# Patient Record
Sex: Male | Born: 1980 | Race: White | Hispanic: No | Marital: Married | Smoking: Current every day smoker
Health system: Southern US, Community
[De-identification: ages and names within clinical notes are randomized; demographics above are authoritative.]

## PROBLEM LIST (undated history)

## (undated) DIAGNOSIS — F1910 Other psychoactive substance abuse, uncomplicated: Secondary | ICD-10-CM

## (undated) DIAGNOSIS — F32A Depression, unspecified: Secondary | ICD-10-CM

## (undated) DIAGNOSIS — F1410 Cocaine abuse, uncomplicated: Secondary | ICD-10-CM

## (undated) DIAGNOSIS — F329 Major depressive disorder, single episode, unspecified: Secondary | ICD-10-CM

## (undated) DIAGNOSIS — F419 Anxiety disorder, unspecified: Secondary | ICD-10-CM

## (undated) DIAGNOSIS — F209 Schizophrenia, unspecified: Secondary | ICD-10-CM

## (undated) DIAGNOSIS — J40 Bronchitis, not specified as acute or chronic: Secondary | ICD-10-CM

## (undated) DIAGNOSIS — F319 Bipolar disorder, unspecified: Secondary | ICD-10-CM

## (undated) DIAGNOSIS — F1110 Opioid abuse, uncomplicated: Secondary | ICD-10-CM

## (undated) HISTORY — PX: TYMPANOPLASTY: SHX33

## (undated) HISTORY — PX: NOSE SURGERY: SHX723

---

## 1999-03-10 ENCOUNTER — Emergency Department (HOSPITAL_COMMUNITY): Admission: EM | Admit: 1999-03-10 | Discharge: 1999-03-10 | Payer: Self-pay

## 2007-12-22 ENCOUNTER — Emergency Department: Payer: Self-pay | Admitting: Emergency Medicine

## 2008-02-06 ENCOUNTER — Emergency Department: Payer: Self-pay | Admitting: Internal Medicine

## 2008-02-26 ENCOUNTER — Emergency Department: Payer: Self-pay | Admitting: Emergency Medicine

## 2008-05-29 ENCOUNTER — Emergency Department: Payer: Self-pay | Admitting: Internal Medicine

## 2008-07-18 ENCOUNTER — Ambulatory Visit: Payer: Self-pay | Admitting: Pain Medicine

## 2008-10-29 ENCOUNTER — Emergency Department: Payer: Self-pay | Admitting: Emergency Medicine

## 2009-10-17 ENCOUNTER — Emergency Department: Payer: Self-pay | Admitting: Emergency Medicine

## 2009-11-17 ENCOUNTER — Emergency Department: Payer: Self-pay | Admitting: Emergency Medicine

## 2010-02-21 ENCOUNTER — Inpatient Hospital Stay: Payer: Self-pay | Admitting: Psychiatry

## 2010-03-25 ENCOUNTER — Emergency Department: Payer: Self-pay | Admitting: Unknown Physician Specialty

## 2010-03-27 ENCOUNTER — Emergency Department: Payer: Self-pay | Admitting: Emergency Medicine

## 2010-04-03 ENCOUNTER — Ambulatory Visit: Payer: Self-pay | Admitting: Unknown Physician Specialty

## 2010-04-24 ENCOUNTER — Emergency Department: Payer: Self-pay | Admitting: Emergency Medicine

## 2011-06-29 ENCOUNTER — Emergency Department: Payer: Self-pay | Admitting: Emergency Medicine

## 2011-12-04 ENCOUNTER — Emergency Department: Payer: Self-pay | Admitting: Emergency Medicine

## 2011-12-26 ENCOUNTER — Emergency Department: Payer: Self-pay | Admitting: Emergency Medicine

## 2012-03-15 LAB — URINALYSIS, COMPLETE
Bacteria: NONE SEEN
Bilirubin,UR: NEGATIVE
Blood: NEGATIVE
Glucose,UR: NEGATIVE mg/dL (ref 0–75)
Hyaline Cast: 1
Leukocyte Esterase: NEGATIVE
Nitrite: NEGATIVE
Ph: 5 (ref 4.5–8.0)
Protein: NEGATIVE
RBC,UR: 2 /HPF (ref 0–5)
Specific Gravity: 1.028 (ref 1.003–1.030)
Squamous Epithelial: NONE SEEN
WBC UR: 4 /HPF (ref 0–5)

## 2012-03-15 LAB — COMPREHENSIVE METABOLIC PANEL
Albumin: 4.3 g/dL (ref 3.4–5.0)
Alkaline Phosphatase: 70 U/L (ref 50–136)
Anion Gap: 9 (ref 7–16)
BUN: 14 mg/dL (ref 7–18)
Bilirubin,Total: 0.3 mg/dL (ref 0.2–1.0)
Calcium, Total: 8.7 mg/dL (ref 8.5–10.1)
Chloride: 102 mmol/L (ref 98–107)
Co2: 27 mmol/L (ref 21–32)
Creatinine: 1.35 mg/dL — ABNORMAL HIGH (ref 0.60–1.30)
EGFR (African American): >60
EGFR (Non-African Amer.): >60
Glucose: 110 mg/dL — ABNORMAL HIGH (ref 65–99)
Osmolality: 277 (ref 275–301)
Potassium: 3.7 mmol/L (ref 3.5–5.1)
SGOT(AST): 21 U/L (ref 15–37)
SGPT (ALT): 40 U/L
Sodium: 138 mmol/L (ref 136–145)
Total Protein: 7.8 g/dL (ref 6.4–8.2)

## 2012-03-15 LAB — DRUG SCREEN, URINE
Amphetamines, Ur Screen: NEGATIVE (ref ?–1000)
Barbiturates, Ur Screen: NEGATIVE (ref ?–200)
Benzodiazepine, Ur Scrn: POSITIVE (ref ?–200)
Cannabinoid 50 Ng, Ur ~~LOC~~: POSITIVE (ref ?–50)
Cocaine Metabolite,Ur ~~LOC~~: POSITIVE (ref ?–300)
MDMA (Ecstasy)Ur Screen: NEGATIVE (ref ?–500)
Methadone, Ur Screen: POSITIVE (ref ?–300)
Opiate, Ur Screen: POSITIVE (ref ?–300)
Phencyclidine (PCP) Ur S: NEGATIVE (ref ?–25)
Tricyclic, Ur Screen: NEGATIVE (ref ?–1000)

## 2012-03-15 LAB — CBC
HCT: 45.5 % (ref 40.0–52.0)
HGB: 15.4 g/dL (ref 13.0–18.0)
MCH: 30.3 pg (ref 26.0–34.0)
MCHC: 33.9 g/dL (ref 32.0–36.0)
MCV: 89 fL (ref 80–100)
Platelet: 169 10*3/uL (ref 150–440)
RBC: 5.09 10*6/uL (ref 4.40–5.90)
RDW: 12.6 % (ref 11.5–14.5)
WBC: 11.2 10*3/uL — ABNORMAL HIGH (ref 3.8–10.6)

## 2012-03-15 LAB — ETHANOL
Ethanol %: <0.003 % (ref 0.000–0.080)
Ethanol: <3 mg/dL

## 2012-03-15 LAB — TSH: Thyroid Stimulating Horm: 6.76 u[IU]/mL — ABNORMAL HIGH

## 2012-03-16 ENCOUNTER — Inpatient Hospital Stay: Payer: Self-pay | Admitting: Psychiatry

## 2012-03-16 LAB — URINALYSIS, COMPLETE
Bacteria: NONE SEEN
Bilirubin,UR: NEGATIVE
Glucose,UR: NEGATIVE mg/dL (ref 0–75)
Ketone: NEGATIVE
Leukocyte Esterase: NEGATIVE
Nitrite: NEGATIVE
Ph: 5 (ref 4.5–8.0)
Protein: NEGATIVE
RBC,UR: 3 /HPF (ref 0–5)
Specific Gravity: 1.018 (ref 1.003–1.030)
Squamous Epithelial: <1
WBC UR: 4 /HPF (ref 0–5)

## 2012-03-17 LAB — BEHAVIORAL MEDICINE 1 PANEL
Albumin: 3.4 g/dL (ref 3.4–5.0)
Alkaline Phosphatase: 61 U/L (ref 50–136)
Anion Gap: 4 — ABNORMAL LOW (ref 7–16)
BUN: 11 mg/dL (ref 7–18)
Basophil #: 0 10*3/uL (ref 0.0–0.1)
Basophil %: 0.5 %
Bilirubin,Total: 0.2 mg/dL (ref 0.2–1.0)
Calcium, Total: 8.1 mg/dL — ABNORMAL LOW (ref 8.5–10.1)
Chloride: 106 mmol/L (ref 98–107)
Co2: 29 mmol/L (ref 21–32)
Creatinine: 0.98 mg/dL (ref 0.60–1.30)
EGFR (African American): >60
EGFR (Non-African Amer.): >60
Eosinophil #: 0.1 10*3/uL (ref 0.0–0.7)
Eosinophil %: 1.6 %
Glucose: 107 mg/dL — ABNORMAL HIGH (ref 65–99)
HCT: 44.3 % (ref 40.0–52.0)
HGB: 15 g/dL (ref 13.0–18.0)
Lymphocyte #: 2.4 10*3/uL (ref 1.0–3.6)
Lymphocyte %: 34.6 %
MCH: 30.3 pg (ref 26.0–34.0)
MCHC: 34 g/dL (ref 32.0–36.0)
MCV: 89 fL (ref 80–100)
Monocyte #: 0.5 x10 3/mm (ref 0.2–1.0)
Monocyte %: 7.8 %
Neutrophil #: 3.9 10*3/uL (ref 1.4–6.5)
Neutrophil %: 55.5 %
Osmolality: 277 (ref 275–301)
Platelet: 153 10*3/uL (ref 150–440)
Potassium: 4.2 mmol/L (ref 3.5–5.1)
RBC: 4.96 10*6/uL (ref 4.40–5.90)
RDW: 12.4 % (ref 11.5–14.5)
SGOT(AST): 17 U/L (ref 15–37)
SGPT (ALT): 35 U/L
Sodium: 139 mmol/L (ref 136–145)
Thyroid Stimulating Horm: 1.27 u[IU]/mL
Total Protein: 6.5 g/dL (ref 6.4–8.2)
WBC: 7.1 10*3/uL (ref 3.8–10.6)

## 2012-07-11 LAB — COMPREHENSIVE METABOLIC PANEL
Albumin: 4.3 g/dL (ref 3.4–5.0)
Alkaline Phosphatase: 64 U/L (ref 50–136)
Anion Gap: 11 (ref 7–16)
BUN: 9 mg/dL (ref 7–18)
Bilirubin,Total: 0.5 mg/dL (ref 0.2–1.0)
Calcium, Total: 9 mg/dL (ref 8.5–10.1)
Chloride: 105 mmol/L (ref 98–107)
Co2: 25 mmol/L (ref 21–32)
Creatinine: 0.85 mg/dL (ref 0.60–1.30)
EGFR (African American): >60
EGFR (Non-African Amer.): >60
Glucose: 99 mg/dL (ref 65–99)
Osmolality: 280 (ref 275–301)
Potassium: 3.7 mmol/L (ref 3.5–5.1)
SGOT(AST): 23 U/L (ref 15–37)
SGPT (ALT): 41 U/L (ref 12–78)
Sodium: 141 mmol/L (ref 136–145)
Total Protein: 7.8 g/dL (ref 6.4–8.2)

## 2012-07-11 LAB — TSH: Thyroid Stimulating Horm: 0.688 u[IU]/mL

## 2012-07-11 LAB — CBC
HCT: 43.1 %
HGB: 15.6 g/dL
MCH: 30.9 pg
MCHC: 36.3 g/dL — ABNORMAL HIGH
MCV: 85 fL
Platelet: 180 x10 3/mm 3
RBC: 5.06 x10 6/mm 3
RDW: 12.6 %
WBC: 8.9 x10 3/mm 3

## 2012-07-11 LAB — ETHANOL
Ethanol %: <0.003 % (ref 0.000–0.080)
Ethanol: <3 mg/dL

## 2012-07-12 ENCOUNTER — Inpatient Hospital Stay: Payer: Self-pay | Admitting: Psychiatry

## 2012-07-12 LAB — DRUG SCREEN, URINE
Amphetamines, Ur Screen: NEGATIVE (ref ?–1000)
Barbiturates, Ur Screen: NEGATIVE (ref ?–200)
Benzodiazepine, Ur Scrn: NEGATIVE (ref ?–200)
Cannabinoid 50 Ng, Ur ~~LOC~~: POSITIVE (ref ?–50)
Cocaine Metabolite,Ur ~~LOC~~: POSITIVE (ref ?–300)
MDMA (Ecstasy)Ur Screen: NEGATIVE (ref ?–500)
Methadone, Ur Screen: POSITIVE (ref ?–300)
Opiate, Ur Screen: POSITIVE (ref ?–300)
Phencyclidine (PCP) Ur S: NEGATIVE (ref ?–25)
Tricyclic, Ur Screen: NEGATIVE (ref ?–1000)

## 2012-07-12 LAB — URINALYSIS, COMPLETE
Bacteria: NONE SEEN
Bilirubin,UR: NEGATIVE
Glucose,UR: NEGATIVE mg/dL (ref 0–75)
Ketone: NEGATIVE
Leukocyte Esterase: NEGATIVE
Nitrite: NEGATIVE
Ph: 6 (ref 4.5–8.0)
Protein: NEGATIVE
RBC,UR: 5 /HPF (ref 0–5)
Specific Gravity: 1.015 (ref 1.003–1.030)
Squamous Epithelial: NONE SEEN
WBC UR: NONE SEEN /HPF (ref 0–5)

## 2012-09-16 ENCOUNTER — Emergency Department: Payer: Self-pay | Admitting: Emergency Medicine

## 2012-09-16 LAB — CBC WITH DIFFERENTIAL/PLATELET
Basophil #: 0 10*3/uL (ref 0.0–0.1)
Basophil %: 0.6 %
Eosinophil #: 0.1 10*3/uL (ref 0.0–0.7)
Eosinophil %: 1.1 %
HCT: 44 % (ref 40.0–52.0)
HGB: 15.5 g/dL (ref 13.0–18.0)
Lymphocyte #: 2.2 10*3/uL (ref 1.0–3.6)
Lymphocyte %: 37.5 %
MCH: 30.3 pg (ref 26.0–34.0)
MCHC: 35.2 g/dL (ref 32.0–36.0)
MCV: 86 fL (ref 80–100)
Monocyte #: 0.8 x10 3/mm (ref 0.2–1.0)
Monocyte %: 12.6 %
Neutrophil #: 2.9 10*3/uL (ref 1.4–6.5)
Neutrophil %: 48.2 %
Platelet: 149 10*3/uL — ABNORMAL LOW (ref 150–440)
RBC: 5.11 10*6/uL (ref 4.40–5.90)
RDW: 12.8 % (ref 11.5–14.5)
WBC: 6 10*3/uL (ref 3.8–10.6)

## 2012-09-16 LAB — COMPREHENSIVE METABOLIC PANEL
Albumin: 3.9 g/dL (ref 3.4–5.0)
Alkaline Phosphatase: 70 U/L (ref 50–136)
Anion Gap: 7 (ref 7–16)
BUN: 14 mg/dL (ref 7–18)
Bilirubin,Total: 0.3 mg/dL (ref 0.2–1.0)
Calcium, Total: 8.4 mg/dL — ABNORMAL LOW (ref 8.5–10.1)
Chloride: 106 mmol/L (ref 98–107)
Co2: 27 mmol/L (ref 21–32)
Creatinine: 1.07 mg/dL (ref 0.60–1.30)
EGFR (African American): >60
EGFR (Non-African Amer.): >60
Glucose: 107 mg/dL — ABNORMAL HIGH (ref 65–99)
Osmolality: 280 (ref 275–301)
Potassium: 3.8 mmol/L (ref 3.5–5.1)
SGOT(AST): 24 U/L (ref 15–37)
SGPT (ALT): 50 U/L (ref 12–78)
Sodium: 140 mmol/L (ref 136–145)
Total Protein: 7.5 g/dL (ref 6.4–8.2)

## 2012-09-16 LAB — URINALYSIS, COMPLETE
Bilirubin,UR: NEGATIVE
Glucose,UR: NEGATIVE mg/dL (ref 0–75)
Hyaline Cast: 35
Ketone: NEGATIVE
Leukocyte Esterase: NEGATIVE
Nitrite: NEGATIVE
Ph: 5 (ref 4.5–8.0)
Protein: 30
RBC,UR: 27 /HPF (ref 0–5)
Specific Gravity: 1.025 (ref 1.003–1.030)
Squamous Epithelial: <1
WBC UR: 8 /HPF (ref 0–5)

## 2012-09-18 LAB — URINE CULTURE

## 2014-05-27 ENCOUNTER — Emergency Department: Payer: Self-pay | Admitting: Emergency Medicine

## 2014-05-27 LAB — CBC WITH DIFFERENTIAL/PLATELET
Basophil #: 0.1 10*3/uL (ref 0.0–0.1)
Basophil %: 0.6 %
Eosinophil #: 0.2 10*3/uL (ref 0.0–0.7)
Eosinophil %: 1.6 %
HCT: 42.8 % (ref 40.0–52.0)
HGB: 14.3 g/dL (ref 13.0–18.0)
Lymphocyte #: 2.6 10*3/uL (ref 1.0–3.6)
Lymphocyte %: 23.2 %
MCH: 29.9 pg (ref 26.0–34.0)
MCHC: 33.3 g/dL (ref 32.0–36.0)
MCV: 90 fL (ref 80–100)
Monocyte #: 0.6 x10 3/mm (ref 0.2–1.0)
Monocyte %: 5.3 %
Neutrophil #: 7.8 10*3/uL — ABNORMAL HIGH (ref 1.4–6.5)
Neutrophil %: 69.3 %
Platelet: 154 10*3/uL (ref 150–440)
RBC: 4.77 10*6/uL (ref 4.40–5.90)
RDW: 13.3 % (ref 11.5–14.5)
WBC: 11.2 10*3/uL — ABNORMAL HIGH (ref 3.8–10.6)

## 2014-05-27 LAB — BASIC METABOLIC PANEL
Anion Gap: 2 — ABNORMAL LOW (ref 7–16)
BUN: 11 mg/dL (ref 7–18)
Calcium, Total: 8.5 mg/dL (ref 8.5–10.1)
Chloride: 107 mmol/L (ref 98–107)
Co2: 31 mmol/L (ref 21–32)
Creatinine: 0.92 mg/dL (ref 0.60–1.30)
EGFR (African American): >60
EGFR (Non-African Amer.): >60
Glucose: 136 mg/dL — ABNORMAL HIGH (ref 65–99)
Osmolality: 281 (ref 275–301)
Potassium: 3.8 mmol/L (ref 3.5–5.1)
Sodium: 140 mmol/L (ref 136–145)

## 2014-05-27 LAB — URINALYSIS, COMPLETE
Bacteria: NONE SEEN
Bilirubin,UR: NEGATIVE
Glucose,UR: NEGATIVE mg/dL (ref 0–75)
Ketone: NEGATIVE
Leukocyte Esterase: NEGATIVE
Nitrite: NEGATIVE
Ph: 5 (ref 4.5–8.0)
Protein: NEGATIVE
RBC,UR: 5 /HPF (ref 0–5)
Specific Gravity: 1.027 (ref 1.003–1.030)
Squamous Epithelial: <1
WBC UR: 2 /HPF (ref 0–5)

## 2014-05-27 LAB — DRUG SCREEN, URINE
Amphetamines, Ur Screen: NEGATIVE (ref ?–1000)
Barbiturates, Ur Screen: NEGATIVE (ref ?–200)
Benzodiazepine, Ur Scrn: NEGATIVE (ref ?–200)
Cannabinoid 50 Ng, Ur ~~LOC~~: POSITIVE (ref ?–50)
Cocaine Metabolite,Ur ~~LOC~~: POSITIVE (ref ?–300)
MDMA (Ecstasy)Ur Screen: NEGATIVE (ref ?–500)
Methadone, Ur Screen: POSITIVE (ref ?–300)
Opiate, Ur Screen: POSITIVE (ref ?–300)
Phencyclidine (PCP) Ur S: NEGATIVE (ref ?–25)
Tricyclic, Ur Screen: NEGATIVE (ref ?–1000)

## 2014-06-01 LAB — CULTURE, BLOOD (SINGLE)

## 2014-12-31 NOTE — H&P (Signed)
PATIENT NAME:  Randy Woods, Randy Woods   ATTENDING PHYSICIAN: Dr. Braulio ConteJolanta B. Lenny Woods   IDENTIFYING DATA: Mr. Randy Woods is a 34 year old male with history of opioid dependence.   CHIEF COMPLAINT: "I want off pain killers."   HISTORY OF PRESENT ILLNESS: Mr. Randy Woods was hospitalized at [facility] in July of 2013. This was a very brief hospitalization, and the patient left the hospital before completing opioid detox. He had a followup with [facility] but did not see his doctor. He relapsed on narcotic pain killers immediately following discharge. He returns to the Emergency Room asking for opioid detox. He has two children already and a third one on the way and feels guilty and ashamed of his habit and inability to stop and wants to be the best father for his children. Unfortunately, he lost Medicaid and has not been able to follow up with his primary psychiatrist, Dr. Omelia BlackwaterHeaden. The patient was hospitalized at [facility] in 2011. At that time he opted for a methadone clinic but did have Medicaid. He was discharged with a prescription of methadone to last him until his confirmed visit at a local methadone clinic. It is unclear what happened with that.  I have an impression that the patient came to the hospital in hopes to obtain methadone. Unfortunately, we are not license to do opioid detox using methadone. The patient on the other hand does not have insurance to pay for his methadone clinic. He became rather upset when he learned that methadone will not be given and signed a 72-hour form. He endorses some symptoms of depression with poor sleep, increased appetite, and weight gain. He believes it is from medications, social isolation, crying. He denies suicidal ideations but threatens that if he does not get help he will die by overdose. He lives with his  mother and feels that his habit puts strain on his mother. He uses narcotic pain killers every day. He is not discriminating. He uses Percocet, Vicodin, Opana, methadone, roxy, whatever he can put his hands on. He denies symptoms suggestive of bipolar mania or psychotic symptoms. He denies other than narcotic illicit substance use except for smoking marijuana.   PAST PSYCHIATRIC HISTORY: He had several hospitalizations, including [facility].  He was taking Zyprexa, Zonegran, Wellbutrin, Klonopin, Depakote and Atarax for a period of time but does not feel that medications help. His initial hospitalization was precipitated by the death of his wife eight or so years ago. At that time he tried to commit suicide. He had other suicide attempts and gestures by superficially cutting his wrists.   FAMILY PSYCHIATRIC HISTORY: His father, sister, niece and cousin all have mental illness. His father has a history of suicide attempts. He is deceased now.   PAST MEDICAL HISTORY: Asthma.   ALLERGIES: No known drug allergies.   MEDICATIONS ON ADMISSION: None.   SOCIAL HISTORY: He is widowed. He has two kids at home and a third on the way. He is currently unemployed. He lives with his mother. He was in prison for several years for armed robbery but denies current legal charges pending. He obtains prescription pills illegally off the street.   REVIEW OF SYSTEMS: CONSTITUTIONAL: No fevers or chills. Positive for weight gain. EYES: No double or blurred vision. ENT: No hearing loss. RESPIRATORY: No cough or shortness of breath. CARDIOVASCULAR: No chest pain or  orthopnea. GASTROINTESTINAL: Positive for nausea but no diarrhea yet. GU: No incontinence or dysuria. ENDOCRINE: No heat or cold intolerance. LYMPHATIC: No anemia or easy bruising. INTEGUMENTARY: No acne or rash. MUSCULOSKELETAL: Positive for muscle pain. NEUROLOGIC: No tingling or weakness. PSYCHIATRIC: See history of present illness for details.    PHYSICAL EXAMINATION:  VITAL SIGNS: Blood pressure 133/71, pulse 78, respirations 20, temperature 97.2.   GENERAL: This is an obese male in no acute distress.   HEENT: The pupils are equal, round, and reactive to light. Sclerae are anicteric.   NECK: Supple. No thyromegaly.   LUNGS: Clear to auscultation. No dullness to percussion.   HEART: Regular rhythm and rate. No murmurs, rubs, or gallops.   ABDOMEN: Soft, nontender, nondistended. Positive bowel sounds.   MUSCULOSKELETAL: Normal muscle strength in all extremities.   SKIN: No rashes or bruises.   LYMPHATIC: No cervical adenopathy.   NEUROLOGIC: Cranial nerves II through XII are intact. Normal gait.   LABORATORY DATA: Chemistries are within normal limits. Blood alcohol level is zero. LFTs are within normal limits. TSH 0.688. Urine tox screen positive for cocaine, cannabinoids, opiates, and methadone.  CBC within normal limits. Urinalysis is not suggestive of urinary tract infection.   MENTAL STATUS EXAMINATION ON ADMISSION: The patient is alert and oriented to person, place, time, and situation. He complains of physical symptoms of opioid withdrawal. He recognizes me from previous admission and remembers that I was the one to prescribe him methadone. He was explained that it will not be possible anymore as he has no connection with methadone clinic. He is initially pleasant and polite, then gets irritable when he learns that he is not being prescribed methadone. He maintains some eye contact. His speech is of normal rhythm, rate, and volume. Mood is depressed with flat affect. Thought processing is logical and goal oriented. Thought content: He denies suicidal or homicidal ideation. There are no delusions or paranoia. There are no auditory or visual hallucinations. His cognition is grossly intact. He registers three out of three and recalls three out of three objects after five minutes. He knows the current president. His insight and  judgment are questionable.   SUICIDE RISK ASSESSMENT ON ADMISSION: This is a patient with a long history of bipolar disorder, untreated currently, mood instability, substance abuse with inability to stop, who threatens suicide if not treated for substance abuse.  He is at increased risk for suicide.   ASSESSMENT:  AXIS I:   1. Mood disorder, not otherwise specified.  2. Opioid dependence. 3. Marijuana dependence. 4. Cocaine dependence.   AXIS II: Deferred.  AXIS III: Asthma.   AXIS IV: Substance abuse, treatment compliance, mental illness, employment, financial, family conflict.   AXIS V: Global Assessment of Functioning score on admission is 25.   PLAN: The patient was admitted to [facility] for safety, stabilization and medication management. He was initially placed on suicide precautions and was closely monitored for any unsafe behaviors. He underwent full psychiatric and risk assessment. He received pharmacotherapy, individual and group psychotherapy, substance abuse counseling, and support from therapeutic milieu.   1. Suicidal ideation: This has resolved. The patient is able to contract for safety in the hospital.  2. Opioid detox: He was placed on a standard detox protocol with symptomatic treatment.  3. Substance abuse treatment: In the Emergency Room, the patient was asked repeatedly is he wishes to be referred to a substance abuse treatment facility. He refuses treatment and wants to go  to methadone clinic, however, there are no means to do so.  4. Disposition: The patient will be discharged to his mother's house. He will need a follow-up appointment with a new psychiatrist.    ____________________________ Ellin Goodie. Jag Lenz, MD jbp:cbb D: 07/12/2012 17:53:06 ET T: 07/12/2012 18:22:41 ET JOB#: [id number]  cc: Gracelin Weisberg B. Jennet Maduro, MD, <Dictator> Shari Prows MD ELECTRONICALLY SIGNED 07/20/2012 17:29

## 2014-12-31 NOTE — Discharge Summary (Signed)
PATIENT NAME:  Randy Woods, Randy Woods MR#:  [id number] DATE OF BIRTH:  01-05-1981  DATE OF ADMISSION:  07/12/2012 DATE OF DISCHARGE:  07/13/2012  HOSPITAL COURSE: See dictated history and physical for details of admission. This 34 year old man with a history of opiate dependence and mood disorder presented to the Emergency Room requesting opiate treatment. He was making statements that appeared to be vaguely suicidal but did not actually make any direct threats to harm himself. He reports that his mood has been feeling down and labile and he has been feeling hopeless about his substance abuse problem. He was specifically requesting methadone or Suboxone treatment. He was admitted to the hospital for detox and stabilization. In the hospital, the patient has met with his treatment team and has learned that he is not eligible for either methadone or for Suboxone treatment because of his lack of benefits. Furthermore, he has learned that even if he were we do not typically begin either methadone or Suboxone treatment in the hospital. At this point, he has requested discharge. He was voluntary in the first place and has not made any suicidal statements or threats in the hospital. He is stating that it is important to him to go home so that he can spend the holiday with his son. He denies any suicidal ideation whatsoever. The patient has been counseled very clearly about the high risk of relapse into opiates if he leaves the hospital. He is still having some opiate withdrawal symptoms. I suggested to him that it would be a better idea for his long-term benefit if he stayed in the hospital to complete detox and he might have a better chance of staying clean. The patient understands this and understands that the risks of continued opiate use include further disability and death, but he really wants to be discharged. He has not shown any sign of psychosis. Although he was started on a mood stabilizing medicine in the hospital,  he does not have the ability to afford Seroquel and he already has follow-up treatment in the community. He will be discharged from the hospital today with follow-up arranged at [facility]. No new prescriptions given. Extensive substance abuse counseling done.   DISPOSITION: Discharge back home. Follow-up with [facility].  RESULTS: Labs on admission showed a drug screen positive for cocaine, opiates, cannabis, and methadone. TSH normal. Alcohol undetectable. Chemistry panel normal. CBC essentially normal. Urinalysis unremarkable except for small 2+ blood.       DISCHARGE MEDICATIONS: The patient has an albuterol inhaler which he uses at home and Flexeril which he uses at home as well as Levaquin that he is taking for the next five days. He takes omeprazole at home. No indication for any new medications.   MENTAL STATUS EXAM: Neatly dressed and groomed man, looks his stated age. Cooperative with the interview. Psychomotor activity is slightly jittery but able to stay still and hold a normal conversation. Eye contact good. Affect anxious. Mood stated as being okay but worried about his son. The patient denies any suicidal or homicidal ideation. Denies auditory or visual hallucinations. Has adequate judgment and insight to make immediate decisions for himself. Short and longer term memory intact. Intelligence appears to be normal. States that he has positive things in his life to live for, especially his son.   DISCHARGE DIAGNOSIS PRINCIPLE AND PRIMARY:   AXIS I: Opiate dependence.   SECONDARY DIAGNOSES:   AXIS I:  1. Mood disorder, not otherwise specified, rule out mood disorder secondary to substance abuse.  2. Cocaine abuse.  3. Marijuana abuse.  AXIS II: Deferred.   AXIS III: Mild chronic obstructive pulmonary disease and gastric reflux symptoms.              AXIS IV: Moderate to severe from lack of resources.   AXIS V: Functioning at time of discharge  55. ____________________________ Gonzella Lex, MD jtc:slb D: 07/13/2012 15:11:53 ET (Entered as incorrect work type - 08) T: 07/13/2012 15:53:41 ET JOB#: [id number]  cc: Gonzella Lex, MD, <Dictator> Gonzella Lex MD ELECTRONICALLY SIGNED 07/13/2012 16:34
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
## 2015-01-05 NOTE — H&P (Signed)
PATIENT NAME:  Randy Woods, Randy Woods MR#:  [id number] DATE OF BIRTH:  05/20/1981  DATE OF ADMISSION:  03/16/2012  DICTATION CONTINUES:  LABORATORY DATA: TSH was elevated at 6.76. Chemistry panel unremarkable. Alcohol negative. Urine drug screen was positive for cocaine, positive for opiates, positive for cannabinoids, positive for benzodiazepine.   Urinalysis unremarkable. CBC did show slightly elevated WBC at 11.2.   REVIEW OF SYSTEMS: Constitutional, HEENT, mouth, neurologic, psychiatric, cardiovascular, respiratory, gastrointestinal, genitourinary, skin, musculoskeletal, hematologic, lymphatic, endocrine, metabolic all unremarkable except the following: The patient's repeat TSH was 1.27. His repeat hepatic panel was normal. Repeat WBC was 7.1.   PAST MEDICAL HISTORY:  1. Chronic back pain.  2. Bronchitis.  3. Asthma.   PHYSICAL EXAMINATION:  VITAL SIGNS: Temperature 98, pulse 79, respiratory rate 20, blood pressure 112/70.   GENERAL APPEARANCE: Randy Woods is a well developed, well nourished male appearing his chronologic age sitting up in his hospital bed with no abnormal involuntary movements. His muscle tone is normal. He has no cachexia. His grooming and hygiene are normal.   HEENT: Head normocephalic, atraumatic. Pupils equally round and reactive to light and accommodation. Oropharynx clear without erythema.   EXTREMITIES: No cyanosis, clubbing, or edema.   SKIN: Normal turgor. No rashes. There are scattered tattoos.    LUNGS: Clear to auscultation. No wheezing, rhonchi, or rales.   CARDIOVASCULAR: Regular rate and rhythm. No murmurs, rubs, or gallops.   ABDOMEN: Nondistended. Bowel sounds positive. Soft, nontender, no masses.   GENITOURINARY: Deferred.   NEUROLOGIC: Cranial nerves II through XII intact. General sensory intact throughout to light touch. Motor five of five throughout. Deep tendon reflexes normal strength and symmetry throughout. No Babinski. Coordination intact by  finger to nose bilaterally.   MENTAL STATUS EXAM:  Randy Woods is alert. His eye contact is intermittent , concentration mildly decreased. He is oriented to all spheres. His memory is intact to immediate, recent, and remote. His fund of knowledge, intelligence, and use of language are normal. His abstraction is intact. Speech involves normal rate and prosody without dysarthria. Thought process is logical, coherent, and goal directed without looseness of associations or tangents. Thought content: No thoughts of harming himself or others. No delusions or hallucinations. He does have shame about relapse. Insight is intact. Judgment is intact. Affect is mildly constricted. Mood is mildly anxious.    DISCHARGE SUMMARY:  HISTORY OF PRESENT ILLNESS:  See above.   ANCILLARY CLINICAL DATA: See above regarding his lab tests.   HOSPITAL COURSE: Randy Woods was admitted to the inpatient behavioral health unit and was started on the standard medications which control opiate withdrawal symptoms. He was given clonidine for sympathetic nervous system withdrawal symptoms, Zofran for anti-nausea,  Imodium for anti-diarrhea, ibuprofen for muscle aches, Flexeril for muscle relaxation. These medications were given p.r.n.   Initially the patient had requested that opiates be included in his withdrawal protocol. However, he did provide informed consent regarding the standard of care in the community- using a protocol without opiates for opiate withdrawal.    After approximately 16 hours into the protocol he decided that he did not want to continue the hospitalization and wanted to seek a different program.   CONDITION ON DISCHARGE: Randy Woods is cooperative and socially appropriate and his mood is normal. His shame has resolved and he has responded positively to ego supportive therapy as well as reinforcement of relapse prevention motivation.   MENTAL STATUS EXAM UPON DISCHARGE: Randy Woods is alert. His eye contact is good.  Concentration normal. Abstraction intact. He is oriented to all spheres. Memory intact to immediate, recent, and remote. Fund of knowledge, intelligence, and use of language normal. Speech normal rate and prosody without dysarthria. Thought process logical, coherent, and goal directed. No looseness of associations, no tangents. Thought content: No thoughts of harming himself or others. No delusions or hallucinations.  Affect is slightly anxious.  Mood is within normal limits. Insight intact. Judgment intact.   DISCHARGE DIAGNOSES:  AXIS I:  1. Opioid dependence.  2. Cocaine abuse, marijuana abuse.  3. Anxiety disorder, not otherwise specified. This appears to be worries that have been brought on secondary to his substance habits.   AXIS II: Deferred.   AXIS III:  1. Bronchitis.  2. Asthma.   AXIS IV: Primary support group.   AXIS V: 55.   Randy Woods  is not at risk to harm himself or others. He agrees to call emergency services immediately for any thoughts of harming himself, thoughts of harming others, or distress.   He wants to leave this opioid withdrawal protocol and the inpatient behavioral health unit and he is not committable. Therefore, he will be discharged.   FOLLOWUP:  [facility] 03/22/2012 for an intake.   He did respond to Vistaril 25 to 75 mg q.i.d. for anxiety and insomnia. This is given in the form of a prescription upon discharge.   DIET: Regular.   ACTIVITY: Routine.   His Vistaril prescription is Vistaril 25 mg, 1 to 3 p.o. q.i.d. p.r.n. dispensed #30, refill times one. He will pursue enrolling himself in an opioid program of his choice.  He understands that the undersigned does not sanction a protocol that includes opiates.    ____________________________ Adelene AmasJames S. Redonna Wilbert, MD jsw:bjt D: 03/18/2012 19:09:43 ET T: 03/19/2012 08:02:36 ET JOB#: [id number]  cc: Adelene AmasJames S. Dodge Ator, MD, <Dictator> Lester CarolinaJAMES S Shun Pletz MD ELECTRONICALLY SIGNED 03/27/2012 10:31

## 2015-05-22 ENCOUNTER — Emergency Department
Admission: EM | Admit: 2015-05-22 | Discharge: 2015-05-23 | Disposition: A | Discharge status: Other Acute Inpatient Hospital | Payer: Medicaid Other | Attending: Emergency Medicine | Admitting: Emergency Medicine

## 2015-05-22 ENCOUNTER — Encounter: Payer: Self-pay | Admitting: Emergency Medicine

## 2015-05-22 DIAGNOSIS — F1210 Cannabis abuse, uncomplicated: Secondary | ICD-10-CM | POA: Insufficient documentation

## 2015-05-22 DIAGNOSIS — F1310 Sedative, hypnotic or anxiolytic abuse, uncomplicated: Secondary | ICD-10-CM | POA: Insufficient documentation

## 2015-05-22 DIAGNOSIS — F1110 Opioid abuse, uncomplicated: Secondary | ICD-10-CM

## 2015-05-22 DIAGNOSIS — Z720 Tobacco use: Secondary | ICD-10-CM | POA: Insufficient documentation

## 2015-05-22 DIAGNOSIS — F32A Depression, unspecified: Secondary | ICD-10-CM

## 2015-05-22 DIAGNOSIS — F1410 Cocaine abuse, uncomplicated: Secondary | ICD-10-CM | POA: Diagnosis not present

## 2015-05-22 DIAGNOSIS — F332 Major depressive disorder, recurrent severe without psychotic features: Secondary | ICD-10-CM

## 2015-05-22 DIAGNOSIS — F329 Major depressive disorder, single episode, unspecified: Secondary | ICD-10-CM | POA: Insufficient documentation

## 2015-05-22 HISTORY — DX: Major depressive disorder, single episode, unspecified: F32.9

## 2015-05-22 HISTORY — DX: Bronchitis, not specified as acute or chronic: J40

## 2015-05-22 HISTORY — DX: Anxiety disorder, unspecified: F41.9

## 2015-05-22 HISTORY — DX: Depression, unspecified: F32.A

## 2015-05-22 HISTORY — DX: Other psychoactive substance abuse, uncomplicated: F19.10

## 2015-05-22 LAB — COMPREHENSIVE METABOLIC PANEL
ALT: 35 U/L (ref 17–63)
AST: 27 U/L (ref 15–41)
Albumin: 4 g/dL (ref 3.5–5.0)
Alkaline Phosphatase: 53 U/L (ref 38–126)
Anion gap: 8 (ref 5–15)
BUN: 8 mg/dL (ref 6–20)
CO2: 26 mmol/L (ref 22–32)
Calcium: 8.8 mg/dL — ABNORMAL LOW (ref 8.9–10.3)
Chloride: 102 mmol/L (ref 101–111)
Creatinine, Ser: 0.92 mg/dL (ref 0.61–1.24)
GFR calc Af Amer: >60 mL/min (ref >60)
GFR calc non Af Amer: >60 mL/min (ref >60)
Glucose, Bld: 120 mg/dL — ABNORMAL HIGH (ref 65–99)
Potassium: 4.1 mmol/L (ref 3.5–5.1)
Sodium: 136 mmol/L (ref 135–145)
Total Bilirubin: 0.5 mg/dL (ref 0.3–1.2)
Total Protein: 7.5 g/dL (ref 6.5–8.1)

## 2015-05-22 LAB — URINE DRUG SCREEN, QUALITATIVE (ARMC ONLY)
Amphetamines, Ur Screen: NOT DETECTED
Barbiturates, Ur Screen: NOT DETECTED
Benzodiazepine, Ur Scrn: POSITIVE — AB
Cannabinoid 50 Ng, Ur ~~LOC~~: POSITIVE — AB
Cocaine Metabolite,Ur ~~LOC~~: POSITIVE — AB
MDMA (Ecstasy)Ur Screen: NOT DETECTED
Methadone Scn, Ur: POSITIVE — AB
Opiate, Ur Screen: POSITIVE — AB
Phencyclidine (PCP) Ur S: NOT DETECTED
Tricyclic, Ur Screen: NOT DETECTED

## 2015-05-22 LAB — ACETAMINOPHEN LEVEL: Acetaminophen (Tylenol), Serum: <10 ug/mL — ABNORMAL LOW (ref 10–30)

## 2015-05-22 LAB — ETHANOL: Alcohol, Ethyl (B): <5 mg/dL (ref <5)

## 2015-05-22 LAB — CBC
HCT: 46.1 % (ref 40.0–52.0)
Hemoglobin: 15.2 g/dL (ref 13.0–18.0)
MCH: 29.1 pg (ref 26.0–34.0)
MCHC: 33 g/dL (ref 32.0–36.0)
MCV: 88.1 fL (ref 80.0–100.0)
Platelets: 208 10*3/uL (ref 150–440)
RBC: 5.23 MIL/uL (ref 4.40–5.90)
RDW: 13.3 % (ref 11.5–14.5)
WBC: 11.7 10*3/uL — ABNORMAL HIGH (ref 3.8–10.6)

## 2015-05-22 LAB — SALICYLATE LEVEL: Salicylate Lvl: <4 mg/dL (ref 2.8–30.0)

## 2015-05-22 MED ORDER — CLONIDINE HCL 0.1 MG PO TABS
ORAL_TABLET | ORAL | Status: AC
Start: 2015-05-22 — End: 2015-05-22
  Filled 2015-05-22: qty 1

## 2015-05-22 MED ORDER — IBUPROFEN 800 MG PO TABS
ORAL_TABLET | ORAL | Status: AC
  Filled 2015-05-22: qty 1

## 2015-05-22 MED ORDER — NICOTINE 10 MG IN INHA
1.0000 | RESPIRATORY_TRACT | Status: DC | PRN
  Administered 2015-05-23: 1 via RESPIRATORY_TRACT
  Filled 2015-05-22 (×2): qty 36

## 2015-05-22 MED ORDER — IBUPROFEN 800 MG PO TABS
800.0000 mg | ORAL_TABLET | Freq: Once | ORAL | Status: AC
  Administered 2015-05-22: 800 mg via ORAL

## 2015-05-22 MED ORDER — CLONIDINE HCL 0.1 MG PO TABS
0.1000 mg | ORAL_TABLET | Freq: Three times a day (TID) | ORAL | Status: DC
  Administered 2015-05-22 – 2015-05-23 (×3): 0.1 mg via ORAL
  Filled 2015-05-22 (×3): qty 1

## 2015-05-22 NOTE — ED Notes (Signed)
Report received from Amy, RN. Pt. Alert and oriented in no distress; verbalizes SI; denies HI, AVH and pain.  Pt. Instructed to come to me with problems or concerns.Will continue to monitor for safety via security cameras and Q 15 minute checks.

## 2015-05-22 NOTE — ED Notes (Signed)
BEHAVIORAL HEALTH ROUNDING Patient sleeping: No. Patient alert and oriented: yes Behavior appropriate: Yes.  ; If no, describe:  Nutrition and fluids offered: Yes  Toileting and hygiene offered: Yes  Sitter present: no Law enforcement present: Yes  

## 2015-05-22 NOTE — ED Notes (Signed)
Pt. Noted in room. No complaints or concerns voiced. No distress or abnormal behavior noted. Will continue to monitor with security cameras. Q 15 minute rounds continue. 

## 2015-05-22 NOTE — ED Provider Notes (Addendum)
[facility] Emergency Department Provider Note  ____________________________________________  Time seen: Approximately 1:44 PM  I have reviewed the triage vital signs and the nursing notes.   HISTORY  Chief Complaint Depression    HPI Randy Woods. is a 34 y.o. male patient reports he recently started using heroin again. He is using between a gram and a gram and half a day. This is upsetting to him and he is depressed and thinking of overdosing to kill himself using heroin. Patient denies any other past medical history beside what is listed below   Past Medical History  Diagnosis Date  . Bronchitis   . Anxiety   . Depression   . Drug abuse     There are no active problems to display for this patient.   Past Surgical History  Procedure Laterality Date  . Nose surgery      No current outpatient prescriptions on file.  Allergies Narcan and Suboxone  Family History  Problem Relation Age of Onset  . Diabetes Mother   . Hypertension Father   . Kidney failure Father   . Diabetes Father     Social History Social History  Substance Use Topics  . Smoking status: Current Every Day Smoker  . Smokeless tobacco: None  . Alcohol Use: No    Review of Systems Constitutional: No fever/chills Eyes: No visual changes. ENT: No sore throat. Cardiovascular: Denies chest pain. Respiratory: Denies shortness of breath. Gastrointestinal: No abdominal pain.  No nausea, no vomiting.  No diarrhea.  No constipation. Genitourinary: Negative for dysuria. Musculoskeletal: Negative for back pain. Skin: Negative for rash. Neurological: Negative for headaches, focal weakness or numbness.  10-point ROS otherwise negative.  ____________________________________________   PHYSICAL EXAM:  VITAL SIGNS: ED Triage Vitals  Enc Vitals Group     BP 05/22/15 1220 133/74 mmHg     Pulse Rate 05/22/15 1220 83     Resp 05/22/15 1220 20     Temp 05/22/15 1220  98.5 F (36.9 C)     Temp Source 05/22/15 1220 Oral     SpO2 05/22/15 1220 97 %     Weight 05/22/15 1220 350 lb (158.759 kg)     Height 05/22/15 1220  (1.753 m)     Head Cir --      Peak Flow --      Pain Score --      Pain Loc --      Pain Edu? --      Excl. in GC? --    Constitutional: Alert and oriented. Well appearing and in no acute distress. Eyes: Conjunctivae are normal. PERRL. EOMI. Head: Atraumatic. Nose: No congestion/rhinnorhea. Mouth/Throat: Mucous membranes are moist.  Oropharynx non-erythematous. Neck: No stridor Cardiovascular: Normal rate, regular rhythm. Grossly normal heart sounds.  Good peripheral circulation. Respiratory: Normal respiratory effort.  No retractions. Lungs CTAB. Gastrointestinal: Soft and nontender. No distention. No abdominal bruits. No CVA tenderness. Musculoskeletal: No lower extremity tenderness nor edema.  No joint effusions. Neurologic:  Normal speech and language. No gross focal neurologic deficits are appreciated. No gait instability. Skin:  Skin is warm, dry and intact. No rash noted. Psychiatric: Mood and affect are normal. Speech and behavior are normal.  ____________________________________________   LABS (all labs ordered are listed, but only abnormal results are displayed)  Labs Reviewed  COMPREHENSIVE METABOLIC PANEL - Abnormal; Notable for the following:    Glucose, Bld 120 (*)    Calcium 8.8 (*)    All other  components within normal limits  ACETAMINOPHEN LEVEL - Abnormal; Notable for the following:    Acetaminophen (Tylenol), Serum <10 (*)    All other components within normal limits  CBC - Abnormal; Notable for the following:    WBC 11.7 (*)    All other components within normal limits  URINE DRUG SCREEN, QUALITATIVE ([facility] ONLY) - Abnormal; Notable for the following:    Cocaine Metabolite,Ur [place] POSITIVE (*)    Opiate, Ur Screen POSITIVE (*)    Cannabinoid 50 Ng, Ur [place] POSITIVE (*)    Benzodiazepine, Ur Scrn  POSITIVE (*)    Methadone Scn, Ur POSITIVE (*)    All other components within normal limits  ETHANOL  SALICYLATE LEVEL   ____________________________________________  EKG   ____________________________________________  RADIOLOGY   ____________________________________________   PROCEDURES   ____________________________________________   INITIAL IMPRESSION / ASSESSMENT AND PLAN / ED COURSE  Pertinent labs & imaging results that were available during my care of the patient were reviewed by me and considered in my medical decision making (see chart for details).   ____________________________________________   FINAL CLINICAL IMPRESSION(S) / ED DIAGNOSES  Final diagnoses:  Depressed      Arnaldo Natal, MD 05/22/15 1516  Arnaldo Natal, MD 05/22/15 1520

## 2015-05-22 NOTE — ED Notes (Signed)
Pt reports that he came in today because he is "depressed" and "tired." States that he has been depressed since starting back on heroin 3 months ago. He states that he has been off his bipolar meds for almost a year and would like to get back on them. Pt states that his goal for coming to ED is to detox and get help staying off heroin. He did achieve some success in staying off heroin by using methadone clinic in [place] but now he has no ride to get there. He admits to [facility], stating that he would overdose as his method. He has 3 children who live with him and his mother. The mother of his oldest child died of an OD 10 years ago. Pt given a drink offered a blanket, which he declined. Pt fidgety and restless, stating that he has cold sweats and significant anxiety at this time.

## 2015-05-22 NOTE — ED Notes (Signed)
BEHAVIORAL HEALTH ROUNDING Patient sleeping: No. Patient alert and oriented: yes Behavior appropriate: Yes.  ; If no, describe:  Nutrition and fluids offered: Yes  Toileting and hygiene offered: Yes  Sitter present: not applicable Law enforcement present: Yes  

## 2015-05-22 NOTE — ED Notes (Signed)
BEHAVIORAL HEALTH ROUNDING Patient sleeping: No. Patient alert and oriented: yes Behavior appropriate: Yes.  ; If no, describe:  Nutrition and fluids offered: Yes  Toileting and hygiene offered: Yes  Sitter present: not applicable Law enforcement present: Yes   Maintained on 15 checks for safety.

## 2015-05-22 NOTE — ED Notes (Signed)
Patient reports SI since getting back on heroine.  Denies HI.  Admits to auditory and visual hallucinations at time.  Reports he is paranoid; this happened after being in prison for 5 years.  Does have plan on how he would hurt himself; would OD on heroine; does have access to plan.

## 2015-05-22 NOTE — ED Notes (Signed)
Patient in dayroom, making a phone call. No complaints or concerns voiced. No distress or abnormal behavior noted. Will continue to monitor with security cameras. Q 15 minute rounds continue.

## 2015-05-22 NOTE — ED Notes (Signed)
BEHAVIORAL HEALTH ROUNDING Patient sleeping: No. Patient alert and oriented: yes Behavior appropriate: Yes.  ; If no, describe:  Nutrition and fluids offered: Yes  Toileting and hygiene offered: Yes  Sitter present: no Law enforcement present: Yes    Pt given sandwich tray

## 2015-05-22 NOTE — ED Notes (Signed)
Pt. Noted in room. No complaints or concerns voiced. No distress or abnormal behavior noted; medication was provided and accepted. Will continue to monitor with security cameras. Q 15 minute rounds continue.

## 2015-05-22 NOTE — Consult Note (Signed)
[facility] Face-to-Face Psychiatry Consult   Reason for Consult:  Consult for this 34 year old man with a history of opiate abuse and other substance abuse as well as mood disorder who comes to the hospital stating that he is having suicidal thoughts. Referring Physician:  Cinda Quest Patient Identification: Randy Woods. MRN:  [id number] Principal Diagnosis: Severe recurrent major depression without psychotic features Diagnosis:   Patient Active Problem List   Diagnosis Date Noted  . Severe recurrent major depression without psychotic features [F33.2] 05/22/2015  . Opiate abuse, continuous [F11.10] 05/22/2015  . Cocaine abuse [F14.10] 05/22/2015    Total Time spent with patient: 1 hour  Subjective:   Randy Woods. is a 34 y.o. male patient admitted with "I need to get back on my medicine and get straight and get off these drugs".  HPI:  Information from the patient and the chart. Patient came voluntarily to the emergency room today. He states he is feeling very depressed. Mood is sad and down all the time and has been that way for over a month. He has little enjoyment in normal activities. Energy level is been poor. Concentration poor. Sleep poor with frequent morning awakening. Appetite normal. He endorses suicidal ideation for the last 3 days. Has been thinking about overdosing on heroin to kill himself. Denies having any psychotic symptoms. He is not currently taking any psychiatric medicine or getting any outpatient mental health treatment. He lives with his mother. Not working. Biggest stress is his inability to stop using drugs. He has been back on heroin for about 3 months after being in a methadone clinic. [place] past psychiatric history: History of depression and mood instability. Prior hospitalizations. Last time he was in the hospital was mostly about opiate abuse. He does have a past history of suicide attempts when he was much younger. He claims that he was prescribed Adderall and  Klonopin by an outpatient doctor but is not currently taking them.  Social history: Not working. Lives with his mother. He does have children but they are not in his custody. Using opiates continuously.  Family history: No known family history of mental health or substance abuse problems.  Medical history: No significant ongoing medical problems. Overweight. No other known illnesses no history of heart attack or stroke.  Current medications: None HPI Elements:   Quality:  Suicidal ideation. Severity:  Severe potentially life threatening. Timing:  Last 3 days. Duration:  Ongoing. Context:  Depression as well as ongoing heroin abuse.  Past Medical History:  Past Medical History  Diagnosis Date  . Bronchitis   . Anxiety   . Depression   . Drug abuse     Past Surgical History  Procedure Laterality Date  . Nose surgery     Family History:  Family History  Problem Relation Age of Onset  . Diabetes Mother   . Hypertension Father   . Kidney failure Father   . Diabetes Father    Social History:  History  Alcohol Use No     History  Drug Use  . Yes  . Special: Cocaine    Comment: heroine    Social History   Social History  . Marital Status: Widowed    Spouse Name: N/A  . Number of Children: N/A  . Years of Education: N/A   Social History Main Topics  . Smoking status: Current Every Day Smoker  . Smokeless tobacco: None  . Alcohol Use: No  . Drug Use: Yes    Special:  Cocaine     Comment: heroine  . Sexual Activity: Not Asked   Other Topics Concern  . None   Social History Narrative  . None   Additional Social History:    Pain Medications: None Reported  Prescriptions: various benzodiazepine when he can not located or afford heroin  Over the Counter: None Reported  History of alcohol / drug use?: Yes Longest period of sobriety (when/how long): 16  months  Negative Consequences of Use: Financial, Personal relationships, Work / Youth worker Withdrawal Symptoms:  Other (Comment) (restlessness) Name of Substance 1: herion  1 - Age of First Use: Early 20's  1 - Amount (size/oz): 1-1.5 grams  1 - Frequency: daily  1 - Duration: 3 months  1 - Last Use / Amount: yesterday                    Allergies:   Allergies  Allergen Reactions  . Narcan [Naloxone Hcl] Other (See Comments)    Sweating, vomiting  . Suboxone [Buprenorphine Hcl-Naloxone Hcl] Nausea And Vomiting    Labs:  Results for orders placed or performed during the hospital encounter of 05/22/15 (from the past 48 hour(s))  Comprehensive metabolic panel     Status: Abnormal   Collection Time: 05/22/15 12:39 PM  Result Value Ref Range   Sodium 136 135 - 145 mmol/L   Potassium 4.1 3.5 - 5.1 mmol/L   Chloride 102 101 - 111 mmol/L   CO2 26 22 - 32 mmol/L   Glucose, Bld 120 (H) 65 - 99 mg/dL   BUN 8 6 - 20 mg/dL   Creatinine, Ser 0.92 0.61 - 1.24 mg/dL   Calcium 8.8 (L) 8.9 - 10.3 mg/dL   Total Protein 7.5 6.5 - 8.1 g/dL   Albumin 4.0 3.5 - 5.0 g/dL   AST 27 15 - 41 U/L   ALT 35 17 - 63 U/L   Alkaline Phosphatase 53 38 - 126 U/L   Total Bilirubin 0.5 0.3 - 1.2 mg/dL   GFR calc non Af Amer >60 >60 mL/min   GFR calc Af Amer >60 >60 mL/min    Comment: (NOTE) The eGFR has been calculated using the CKD EPI equation. This calculation has not been validated in all clinical situations. eGFR's persistently <60 mL/min signify possible Chronic Kidney Disease.    Anion gap 8 5 - 15  Ethanol (ETOH)     Status: None   Collection Time: 05/22/15 12:39 PM  Result Value Ref Range   Alcohol, Ethyl (B) <5 <5 mg/dL    Comment:        LOWEST DETECTABLE LIMIT FOR SERUM ALCOHOL IS 5 mg/dL FOR MEDICAL PURPOSES ONLY   Salicylate level     Status: None   Collection Time: 05/22/15 12:39 PM  Result Value Ref Range   Salicylate Lvl <5.2 2.8 - 30.0 mg/dL  Acetaminophen level     Status: Abnormal   Collection Time: 05/22/15 12:39 PM  Result Value Ref Range   Acetaminophen (Tylenol), Serum <10  (L) 10 - 30 ug/mL    Comment:        THERAPEUTIC CONCENTRATIONS VARY SIGNIFICANTLY. A RANGE OF 10-30 ug/mL MAY BE AN EFFECTIVE CONCENTRATION FOR MANY PATIENTS. HOWEVER, SOME ARE BEST TREATED AT CONCENTRATIONS OUTSIDE THIS RANGE. ACETAMINOPHEN CONCENTRATIONS >150 ug/mL AT 4 HOURS AFTER INGESTION AND >50 ug/mL AT 12 HOURS AFTER INGESTION ARE OFTEN ASSOCIATED WITH TOXIC REACTIONS.   CBC     Status: Abnormal   Collection Time: 05/22/15 12:39 PM  Result Value Ref Range   WBC 11.7 (H) 3.8 - 10.6 K/uL   RBC 5.23 4.40 - 5.90 MIL/uL   Hemoglobin 15.2 13.0 - 18.0 g/dL   HCT 46.1 40.0 - 52.0 %   MCV 88.1 80.0 - 100.0 fL   MCH 29.1 26.0 - 34.0 pg   MCHC 33.0 32.0 - 36.0 g/dL   RDW 13.3 11.5 - 14.5 %   Platelets 208 150 - 440 K/uL  Urine Drug Screen, Qualitative ([facility] only)     Status: Abnormal   Collection Time: 05/22/15 12:39 PM  Result Value Ref Range   Tricyclic, Ur Screen NONE DETECTED NONE DETECTED   Amphetamines, Ur Screen NONE DETECTED NONE DETECTED   MDMA (Ecstasy)Ur Screen NONE DETECTED NONE DETECTED   Cocaine Metabolite,Ur [place] POSITIVE (A) NONE DETECTED   Opiate, Ur Screen POSITIVE (A) NONE DETECTED   Phencyclidine (PCP) Ur S NONE DETECTED NONE DETECTED   Cannabinoid 50 Ng, Ur  POSITIVE (A) NONE DETECTED   Barbiturates, Ur Screen NONE DETECTED NONE DETECTED   Benzodiazepine, Ur Scrn POSITIVE (A) NONE DETECTED   Methadone Scn, Ur POSITIVE (A) NONE DETECTED    Comment: (NOTE) 631  Tricyclics, urine               Cutoff 1000 ng/mL 200  Amphetamines, urine             Cutoff 1000 ng/mL 300  MDMA (Ecstasy), urine           Cutoff 500 ng/mL 400  Cocaine Metabolite, urine       Cutoff 300 ng/mL 500  Opiate, urine                   Cutoff 300 ng/mL 600  Phencyclidine (PCP), urine      Cutoff 25 ng/mL 700  Cannabinoid, urine              Cutoff 50 ng/mL 800  Barbiturates, urine             Cutoff 200 ng/mL 900  Benzodiazepine, urine           Cutoff 200 ng/mL 1000 Methadone,  urine                Cutoff 300 ng/mL 1100 1200 The urine drug screen provides only a preliminary, unconfirmed 1300 analytical test result and should not be used for non-medical 1400 purposes. Clinical consideration and professional judgment should 1500 be applied to any positive drug screen result due to possible 1600 interfering substances. A more specific alternate chemical method 1700 must be used in order to obtain a confirmed analytical result.  1800 Gas chromato graphy / mass spectrometry (GC/MS) is the preferred 1900 confirmatory method.     Vitals: Blood pressure 133/74, pulse 83, temperature 98.5 F (36.9 C), temperature source Oral, resp. rate 20, height _0  (1.753 m), weight 158.759 kg (350 lb), SpO2 97 %.  Risk to Self: Suicidal Ideation: Yes-Currently Present Suicidal Intent: Yes-Currently Present Is patient at risk for suicide?: Yes Suicidal Plan?: Yes-Currently Present Specify Current Suicidal Plan: Overdose  Access to Means: Yes Specify Access to Suicidal Means: Pt actively using  What has been your use of drugs/alcohol within the last 12 months?: Herion usage for the last 3 months  How many times?: 1 Other Self Harm Risks: NA Triggers for Past Attempts: Unknown Intentional Self Injurious Behavior: None Risk to Others: Homicidal Ideation: No Thoughts of Harm to Others: No Current Homicidal Intent: No Current Homicidal Plan:  No Access to Homicidal Means: No History of harm to others?: No Assessment of Violence: On admission Does patient have access to weapons?: No Criminal Charges Pending?: No Does patient have a court date: No Prior Inpatient Therapy: Prior Inpatient Therapy: Yes Prior Therapy Dates: unknown  [facility] Facilty[facility]Provider[facility]  Reason for Treatment: Detox  Prior Outpatient Therapy: Prior Outpatient Therapy: Yes Prior Therapy Dates: unknown  Prior Therapy Facilty/Provider(s): [facility]  Reason for  Treatment: Medication management  Does patient have an ACCT team?: No Does patient have Intensive In-House Services?  : No Does patient have [facility] services? : No Does patient have P4CC services?: No  Current Facility-Administered Medications  Medication Dose Route Frequency Provider Last Rate Last Dose  . cloNIDine (CATAPRES) tablet 0.1 mg  0.1 mg Oral TID Nena Polio, MD   0.1 mg at 05/22/15 1420  . nicotine (NICOTROL) 10 MG inhaler 1 continuous puffing  1 continuous puffing Inhalation PRN Nena Polio, MD       No current outpatient prescriptions on file.    Musculoskeletal: Strength & Muscle Tone: within normal limits Gait & Station: normal Patient leans: N/A  Psychiatric Specialty Exam: Physical Exam  Constitutional: He appears well-developed and well-nourished.  HENT:  Head: Normocephalic and atraumatic.  Eyes: Conjunctivae are normal. Pupils are equal, round, and reactive to light.  Neck: Normal range of motion.  Cardiovascular: Normal heart sounds.   Respiratory: Effort normal.  GI: Soft.  Musculoskeletal: Normal range of motion.  Neurological: He is alert.  Skin: Skin is warm and dry.  Psychiatric: His speech is normal and behavior is normal. Cognition and memory are normal. He expresses impulsivity. He exhibits a depressed mood. He expresses suicidal ideation.    Review of Systems  Constitutional: Negative.   HENT: Negative.   Eyes: Negative.   Respiratory: Negative.   Cardiovascular: Negative.   Gastrointestinal: Negative.   Musculoskeletal: Negative.   Skin: Negative.   Neurological: Negative.   Psychiatric/Behavioral: Positive for depression, suicidal ideas and substance abuse. Negative for hallucinations. The patient is nervous/anxious and has insomnia.     Blood pressure 133/74, pulse 83, temperature 98.5 F (36.9 C), temperature source Oral, resp. rate 20, height _0  (1.753 m), weight 158.759 kg (350 lb), SpO2 97 %.Body mass index is 51.66  kg/(m^2).  General Appearance: Disheveled  Eye Sport and exercise psychologist::  Fair  Speech:  Clear and Coherent  Volume:  Normal  Mood:  Dysphoric  Affect:  Congruent  Thought Process:  Goal Directed  Orientation:  Full (Time, Place, and Person)  Thought Content:  Negative  Suicidal Thoughts:  Yes.  with intent/plan  Homicidal Thoughts:  No  Memory:  Immediate;   Fair Recent;   Fair Remote;   Fair  Judgement:  Fair  Insight:  Fair  Psychomotor Activity:  Decreased  Concentration:  Fair  Recall:  [employer] of Knowledge:Fair  Language: Good  Akathisia:  No  Handed:  Right  AIMS (if indicated):     Assets:  Communication Skills Desire for Improvement Physical Health Resilience  ADL's:  Intact  Cognition: WNL  Sleep:      Medical Decision Making: Review of Psycho-Social Stressors (1), Review or order clinical lab tests (1), Established Problem, Worsening (2), Review or order medicine tests (1) and Review of Medication Regimen & Side Effects (2)  Treatment Plan Summary: Medication management and Plan Patient is reporting depression with suicidal ideation. His affect looks only mildly dysphoric. My impression is that  most likely he is primarily interested in trying this.using heroin. Nevertheless he does have risk factors for suicide. We do not have a bed available right now to admit him to the hospital. He will receive when necessary medication for withdrawal while he is in the emergency room. Reevaluate daily to see if he still needs hospitalization when we have a bed available. Supportive counseling done. Labs reviewed. Case discussed with emergency room staff.  Plan:  Recommend psychiatric Inpatient admission when medically cleared. Supportive therapy provided about ongoing stressors. Disposition: Monitor and treat in the emergency room following bed availability  Randy Woods 05/22/2015 5:10 PM

## 2015-05-22 NOTE — BH Assessment (Signed)
Assessment Note  Randy Flett. is an 34 y.o. male. Presenting to the ED with complaints of SI and a h/o heroin use, with an onset of 3 months ago. Pt self-reports a history of Bipolar Disorder and states that he has been off his medications for over 3 months now. Pt reports that he was prescribed adderal, klonopin,and trazodone by his previous psychiatrist. Pt reports that these medications managed his bipolar symptom fairly well but his prescriber Randy Woods)  relocated and he failed to find an alternate provider. Pt reports that he has began using heroin after he stopped complying with his medication regimen, although pt admits that he has been using substance all of his life. Pt has been using 1-1.5 grams of heroin a day, last use on yesterday. Pt is substituting with benzos when can not locate his drug of choice. Pt reports that he has  Received impatient treatment on several occasion twice here at [facility], twice at [facility] (dates unknown) and once [facility] (dates unknown). Pt also reports participating in an outpatient methadone program, which he reports he was fairly successful in . Pt states that he was  clean for 16 months when in the outpatient methadone program but he continued to struggle with the symptoms of his bipolar.    Pt endorses minor withdrawal symptoms , including restlessness and anxiety. Pt denies any HI but does endorses SI with a plan to overdose on heroin. Pt states that he can not be safe if discharged alone. Pt states that he does have a previous suicide attempt in which he attempted to slit his wrist, which occurred over 15 years ago. Pt admits that he experiences auditory and visual hallucinations. This hallucination present themselves in the form of shadows and voices that are clear and per the pts description are command hallucinations.  Pt states that these hallucination increase in frequency and intensity when he feels sleep deprived.    Axis I:  Bipolar, Depressed  Past Medical History:  Past Medical History  Diagnosis Date  . Bronchitis   . Anxiety   . Depression   . Drug abuse     Past Surgical History  Procedure Laterality Date  . Nose surgery      Family History:  Family History  Problem Relation Age of Onset  . Diabetes Mother   . Hypertension Father   . Kidney failure Father   . Diabetes Father     Social History:  reports that he has been smoking.  He does not have any smokeless tobacco history on file. He reports that he uses illicit drugs (Cocaine). He reports that he does not drink alcohol.  Additional Social History:  Alcohol / Drug Use Pain Medications: None Reported  Prescriptions: various benzodiazepine when he can not located or afford heroin  Over the Counter: None Reported  History of alcohol / drug use?: Yes Longest period of sobriety (when/how long): 16  months  Negative Consequences of Use: Financial, Personal relationships, Work / Programmer, multimedia Withdrawal Symptoms: Other (Comment) (restlessness) Substance #1 Name of Substance 1: herion  1 - Age of First Use: Early 20's  1 - Amount (size/oz): 1-1.5 grams  1 - Frequency: daily  1 - Duration: 3 months  1 - Last Use / Amount: yesterday   CIWA: CIWA-Ar BP: 133/74 mmHg Pulse Rate: 83 COWS: Clinical Opiate Withdrawal Scale (COWS) Resting Pulse Rate: Pulse Rate 81-100 Sweating: No report of chills or flushing Restlessness: Reports difficulty sitting still, but is able  to do so Pupil Size: Pupils pinned or normal size for room light Bone or Joint Aches: Not present Runny Nose or Tearing: Not present GI Upset: No GI symptoms Tremor: No tremor Yawning: No yawning Anxiety or Irritability: Patient obviously irritable/anxious Gooseflesh Skin: Skin is smooth COWS Total Score: 4  Allergies:  Allergies  Allergen Reactions  . Narcan [Naloxone Hcl] Other (See Comments)    Sweating, vomiting  . Suboxone [Buprenorphine Hcl-Naloxone Hcl] Nausea And  Vomiting    Home Medications:  (Not in a hospital admission)  OB/GYN Status:  No LMP for male patient.  General Assessment Data Location of Assessment: [facility] ED TTS Assessment: In system Is this a Tele or Face-to-Face Assessment?: Face-to-Face Is this an Initial Assessment or a Re-assessment for this encounter?: Initial Assessment Marital status: Widowed [place] name: N/A Is patient pregnant?: No Pregnancy Status: No Living Arrangements: Children, Parent Can pt return to current living arrangement?: Yes Admission Status: Voluntary Is patient capable of signing voluntary admission?: Yes Referral Source: Self/Family/Friend Insurance type: medicaid   Medical Screening Exam [facility] Walk-in ONLY) Medical Exam completed: Yes  Crisis Care Plan Living Arrangements: Children, Parent Name of Psychiatrist: N/A Name of Therapist: N/A  Education Status Is patient currently in school?: No Current Grade: N/A Highest grade of school patient has completed: Some College Name of school: N/A Contact person: N/A  Risk to self with the past 6 months Suicidal Ideation: Yes-Currently Present Has patient been a risk to self within the past 6 months prior to admission? : Yes Suicidal Intent: Yes-Currently Present Has patient had any suicidal intent within the past 6 months prior to admission? : Yes Is patient at risk for suicide?: Yes Suicidal Plan?: Yes-Currently Present Has patient had any suicidal plan within the past 6 months prior to admission? : Yes Specify Current Suicidal Plan: Overdose  Access to Means: Yes Specify Access to Suicidal Means: Pt actively using  What has been your use of drugs/alcohol within the last 12 months?: Herion usage for the last 3 months  Previous Attempts/Gestures: Yes How many times?: 1 Other Self Harm Risks: NA Triggers for Past Attempts: Unknown Intentional Self Injurious Behavior: None Family Suicide History: No Recent stressful life event(s): Financial  Problems Persecutory voices/beliefs?: Yes Depression: Yes Depression Symptoms: Feeling worthless/self pity, Feeling angry/irritable, Insomnia Substance abuse history and/or treatment for substance abuse?: Yes Suicide prevention information given to non-admitted patients: Not applicable  Risk to Others within the past 6 months Homicidal Ideation: No Does patient have any lifetime risk of violence toward others beyond the six months prior to admission? : No Thoughts of Harm to Others: No Current Homicidal Intent: No Current Homicidal Plan: No Access to Homicidal Means: No History of harm to others?: No Assessment of Violence: On admission Does patient have access to weapons?: No Criminal Charges Pending?: No Does patient have a court date: No Is patient on probation?: No  Psychosis Hallucinations: Visual, Auditory, With command Delusions: Unspecified (Pt is paranoid about others talking about him )  Mental Status Report Appearance/Hygiene: Unremarkable Eye Contact: Fair Motor Activity: Unremarkable Speech: Logical/coherent Level of Consciousness: Restless Mood: Anxious Affect: Anxious Anxiety Level: Moderate Thought Processes: Coherent Judgement: Unimpaired Orientation: Person, Place, Time, Appropriate for developmental age, Situation Obsessive Compulsive Thoughts/Behaviors: Minimal  Cognitive Functioning Concentration: Good Memory: Recent Intact, Remote Intact IQ: Average Insight: Fair Impulse Control: Poor Appetite: Fair Sleep: Decreased Total Hours of Sleep: 5 Vegetative Symptoms: None  ADLScreening [facility] Assessment Services) Patient's cognitive ability adequate to safely complete daily  activities?: Yes Patient able to express need for assistance with ADLs?: Yes Independently performs ADLs?: Yes (appropriate for developmental age)  Prior Inpatient Therapy Prior Inpatient Therapy: Yes Prior Therapy Dates: unknown  Prior Therapy Facilty/Provider(s): [facility]  Reason for Treatment: Detox   Prior Outpatient Therapy Prior Outpatient Therapy: Yes Prior Therapy Dates: unknown  Prior Therapy Facilty/Provider(s): Randy Woods  Reason for Treatment: Medication management  Does patient have an ACCT team?: No Does patient have Intensive In-House Services?  : No Does patient have [facility] services? : No Does patient have P4CC services?: No  ADL Screening (condition at time of admission) Patient's cognitive ability adequate to safely complete daily activities?: Yes Patient able to express need for assistance with ADLs?: Yes Independently performs ADLs?: Yes (appropriate for developmental age)       Abuse/Neglect Assessment (Assessment to be complete while patient is alone) Physical Abuse: Denies Verbal Abuse: Denies Sexual Abuse: Denies Exploitation of patient/patient's resources: Denies Self-Neglect: Denies Possible abuse reported to::  (N/A) Values / Beliefs Cultural Requests During Hospitalization: None Spiritual Requests During Hospitalization: None Consults Spiritual Care Consult Needed: No Social Work Consult Needed: No      Additional Information 1:1 In Past 12 Months?: No CIRT Risk: No Elopement Risk: No Does patient have medical clearance?: Yes  Child/Adolescent Assessment Running Away Risk: Denies Bed-Wetting:  (Pt is an adult )  Disposition:  Disposition Initial Assessment Completed for this Encounter: Yes Disposition of Patient: Referred to (Psych. MD to see) Type of inpatient treatment program: Adult Patient referred to: Other (Comment) (Psych MD to see)  On Site Evaluation by:   Reviewed with Physician:    Asa Saunas 05/22/2015 5:05 PM

## 2015-05-22 NOTE — ED Notes (Signed)
Patient assigned to appropriate care area. Patient oriented to unit/care area: Informed that, for their safety, care areas are designed for safety and monitored by security cameras at all times; and visiting hours explained to patient. Patient verbalizes understanding, and verbal contract for safety obtained.   ENVIRONMENTAL ASSESSMENT Potentially harmful objects out of patient reach: Yes.   Personal belongings secured: Yes.   Patient dressed in hospital provided attire only: Yes.   Plastic bags out of patient reach: Yes.   Patient care equipment (cords, cables, call bells, lines, and drains) shortened, removed, or accounted for: Yes.   Equipment and supplies removed from bottom of stretcher: Yes.   Potentially toxic materials out of patient reach: Yes.   Sharps container removed or out of patient reach: Yes.   

## 2015-05-23 MED ORDER — QUETIAPINE FUMARATE 100 MG PO TABS: 100.0000 mg | ORAL_TABLET | Freq: Every day | ORAL | Status: DC

## 2015-05-23 MED ORDER — ZIPRASIDONE HCL 40 MG PO CAPS: 40.0000 mg | ORAL_CAPSULE | Freq: Two times a day (BID) | ORAL | Status: DC

## 2015-05-23 MED ORDER — QUETIAPINE FUMARATE 100 MG PO TABS
100.0000 mg | ORAL_TABLET | Freq: Every day | ORAL | Status: DC
  Administered 2015-05-23: 100 mg via ORAL
  Filled 2015-05-23: qty 1

## 2015-05-23 MED ORDER — IBUPROFEN 800 MG PO TABS
ORAL_TABLET | ORAL | Status: AC
Start: 2015-05-23 — End: 2015-05-23
  Administered 2015-05-23: 800 mg
  Filled 2015-05-23: qty 1

## 2015-05-23 MED ORDER — QUETIAPINE FUMARATE 25 MG PO TABS
ORAL_TABLET | ORAL | Status: AC
  Administered 2015-05-23: 100 mg via ORAL
  Filled 2015-05-23: qty 4

## 2015-05-23 NOTE — [facility] Counselor (Signed)
Discussed with [facility] AC Inetta Fermo) about possible placement with them. Patient's girlfriend is currently inpatient on the [facility]. IVC faxed to them.

## 2015-05-23 NOTE — ED Notes (Signed)
Pt. Noted in room. No complaints or concerns voiced. No distress or abnormal behavior noted. Will continue to monitor with security cameras. Q 15 minute rounds continue. 

## 2015-05-23 NOTE — ED Notes (Signed)

## 2015-05-23 NOTE — ED Notes (Signed)
Patient is resting comfortably. 

## 2015-05-23 NOTE — [facility] Counselor (Signed)
Per request of Psych  MD (Dr. Clapacs), writer provided the pt. with information and instructions on how to access Outpatient Mental Health & Substance Abuse Treatment ([facility]). 

## 2015-05-23 NOTE — ED Provider Notes (Signed)
Patient cleared for discharge by Dr. Toni Amend. Dr. Toni Amend will give him prescription for Geodon and Seroquel, advised on outpatient follow-up resources.  Sharyn Creamer, MD 05/23/15 [phone]

## 2015-05-23 NOTE — ED Notes (Signed)
BEHAVIORAL HEALTH ROUNDING Patient sleeping: Yes.   Patient alert and oriented: Patient sleeping. Behavior appropriate: Yes.  ; If no, describe:   Nutrition and fluids offered: No Toileting and hygiene offered: Yes  Sitter present: ED tech performing every 15 minute checks Law enforcement present: Yes  and [employer]

## 2015-05-23 NOTE — ED Notes (Signed)

## 2015-05-23 NOTE — ED Notes (Signed)
Patient's sister, Yossi Hinchman, called and states that she thought her brother needed to stay due to conflicts at home.

## 2015-05-23 NOTE — ED Notes (Signed)

## 2015-05-23 NOTE — ED Notes (Signed)
Dr. Toni Amend aware of patient's sister's call.

## 2015-05-23 NOTE — Discharge Instructions (Signed)
You have been seen in the Emergency Department (ED) today for a psychiatric complaint.  You have been evaluated by psychiatry and we believe you are safe to be discharged from the hospital.   ° °Please return to the ED immediately if you have ANY thoughts of hurting yourself or anyone else, so that we may help you. ° °Please avoid alcohol and drug use. ° °Follow up with your doctor and/or therapist as soon as possible regarding today's ED visit.   Please follow up any other recommendations and clinic appointments provided by the psychiatry team that saw you in the Emergency Department. ° ° °Depression °Depression refers to feeling sad, low, down in the dumps, blue, gloomy, or empty. In general, there are two kinds of depression: °1. Normal sadness or normal grief. This kind of depression is one that we all feel from time to time after upsetting life experiences, such as the loss of a job or the ending of a relationship. This kind of depression is considered normal, is short lived, and resolves within a few days to 2 weeks. Depression experienced after the loss of a loved one (bereavement) often lasts longer than 2 weeks but normally gets better with time. °2. Clinical depression. This kind of depression lasts longer than normal sadness or normal grief or interferes with your ability to function at home, at work, and in school. It also interferes with your personal relationships. It affects almost every aspect of your life. Clinical depression is an illness. °Symptoms of depression can also be caused by conditions other than those mentioned above, such as: °· Physical illness. Some physical illnesses, including underactive thyroid gland (hypothyroidism), severe anemia, specific types of cancer, diabetes, uncontrolled seizures, heart and lung problems, strokes, and chronic pain are commonly associated with symptoms of depression. °· Side effects of some prescription medicine. In some people, certain types of medicine  can cause symptoms of depression. °· Substance abuse. Abuse of alcohol and illicit drugs can cause symptoms of depression. °SYMPTOMS °Symptoms of normal sadness and normal grief include the following: °· Feeling sad or crying for short periods of time. °· Not caring about anything (apathy). °· Difficulty sleeping or sleeping too much. °· No longer able to enjoy the things you used to enjoy. °· Desire to be by oneself all the time (social isolation). °· Lack of energy or motivation. °· Difficulty concentrating or remembering. °· Change in appetite or weight. °· Restlessness or agitation. °Symptoms of clinical depression include the same symptoms of normal sadness or normal grief and also the following symptoms: °· Feeling sad or crying all the time. °· Feelings of guilt or worthlessness. °· Feelings of hopelessness or helplessness. °· Thoughts of suicide or the desire to harm yourself (suicidal ideation). °· Loss of touch with reality (psychotic symptoms). Seeing or hearing things that are not real (hallucinations) or having false beliefs about your life or the people around you (delusions and paranoia). °DIAGNOSIS  °The diagnosis of clinical depression is usually based on how bad the symptoms are and how long they have lasted. Your health care provider will also ask you questions about your medical history and substance use to find out if physical illness, use of prescription medicine, or substance abuse is causing your depression. Your health care provider may also order blood tests. °TREATMENT  °Often, normal sadness and normal grief do not require treatment. However, sometimes antidepressant medicine is given for bereavement to ease the depressive symptoms until they resolve. °The treatment for clinical depression   depends on how bad the symptoms are but often includes antidepressant medicine, counseling with a mental health professional, or both. Your health care provider will help to determine what treatment is  best for you. °Depression caused by physical illness usually goes away with appropriate medical treatment of the illness. If prescription medicine is causing depression, talk with your health care provider about stopping the medicine, decreasing the dose, or changing to another medicine. °Depression caused by the abuse of alcohol or illicit drugs goes away when you stop using these substances. Some adults need professional help in order to stop drinking or using drugs. °SEEK IMMEDIATE MEDICAL CARE IF: °· You have thoughts about hurting yourself or others. °· You lose touch with reality (have psychotic symptoms). °· You are taking medicine for depression and have a serious side effect. °FOR MORE INFORMATION °· National Alliance on Mental Illness: www.nami.org  °· National Institute of Mental Health: www.nimh.nih.gov  °Document Released: 08/27/2000 Document Revised: 01/14/2014 Document Reviewed: 11/29/2011 °[employer]® Patient Information ©2015 [employer]. This information is not intended to replace advice given to you by your health care provider. Make sure you discuss any questions you have with your health care provider. ° °

## 2015-05-23 NOTE — ED Notes (Signed)
BEHAVIORAL HEALTH ROUNDING Patient sleeping: No. Patient alert and oriented: yes Behavior appropriate: Yes.  ; If no, describe:  Nutrition and fluids offered: Yes  Toileting and hygiene offered: Yes  Sitter present: ED tech  Law enforcement present: Yes

## 2015-05-23 NOTE — Consult Note (Signed)
[facility] Face-to-Face Psychiatry Consult   Reason for Consult:  Consult for this 34 year old man with a history of opiate abuse and other substance abuse as well as mood disorder who comes to the hospital stating that he is having suicidal thoughts. Referring Physician:  Cinda Quest Patient Identification: Randy Woods. MRN:  [id number] Principal Diagnosis: Severe recurrent major depression without psychotic features Diagnosis:   Patient Active Problem List   Diagnosis Date Noted  . Severe recurrent major depression without psychotic features [F33.2] 05/22/2015  . Opiate abuse, continuous [F11.10] 05/22/2015  . Cocaine abuse [F14.10] 05/22/2015    Total Time spent with patient: 1 hour  Subjective:   Randy Woods. is a 34 y.o. male patient admitted with "I need to get back on my medicine and get straight and get off these drugs".  HPI:  Information from the patient and the chart. Patient came voluntarily to the emergency room today. He states he is feeling very depressed. Mood is sad and down all the time and has been that way for over a month. He has little enjoyment in normal activities. Energy level is been poor. Concentration poor. Sleep poor with frequent morning awakening. Appetite normal. He endorses suicidal ideation for the last 3 days. Has been thinking about overdosing on heroin to kill himself. Denies having any psychotic symptoms. He is not currently taking any psychiatric medicine or getting any outpatient mental health treatment. He lives with his mother. Not working. Biggest stress is his inability to stop using drugs. He has been back on heroin for about 3 months after being in a methadone clinic. [place] past psychiatric history: History of depression and mood instability. Prior hospitalizations. Last time he was in the hospital was mostly about opiate abuse. He does have a past history of suicide attempts when he was much younger. He claims that he was prescribed Adderall and  Klonopin by an outpatient doctor but is not currently taking them.  Social history: Not working. Lives with his mother. He does have children but they are not in his custody. Using opiates continuously.  Family history: No known family history of mental health or substance abuse problems.  Medical history: No significant ongoing medical problems. Overweight. No other known illnesses no history of heart attack or stroke.  Current medications: None  Follow-up note as of Friday the ninth. On interview today the patient says he is no longer having any suicidal thoughts at all. He is feeling frustrated at being cooped up in the hospital. His mood is irritable but he denies any homicidal ideation or thought of hurting anyone. The patient is requesting discharge stating that he will stay with family and that he can follow-up with outpatient mental health and substance abuse treatment. He states that Seroquel and Geodon more helpful for him in the past. I agreed to restart these medicines and give him prescriptions. HPI Elements:   Quality:  Suicidal ideation. Severity:  Severe potentially life threatening. Timing:  Last 3 days. Duration:  Ongoing. Context:  Depression as well as ongoing heroin abuse.  Past Medical History:  Past Medical History  Diagnosis Date  . Bronchitis   . Anxiety   . Depression   . Drug abuse     Past Surgical History  Procedure Laterality Date  . Nose surgery     Family History:  Family History  Problem Relation Age of Onset  . Diabetes Mother   . Hypertension Father   . Kidney failure Father   .  Diabetes Father    Social History:  History  Alcohol Use No     History  Drug Use  . Yes  . Special: Cocaine    Comment: heroine    Social History   Social History  . Marital Status: Widowed    Spouse Name: N/A  . Number of Children: N/A  . Years of Education: N/A   Social History Main Topics  . Smoking status: Current Every Day Smoker  . Smokeless  tobacco: None  . Alcohol Use: No  . Drug Use: Yes    Special: Cocaine     Comment: heroine  . Sexual Activity: Not Asked   Other Topics Concern  . None   Social History Narrative  . None   Additional Social History:    Pain Medications: None Reported  Prescriptions: various benzodiazepine when he can not located or afford heroin  Over the Counter: None Reported  History of alcohol / drug use?: Yes Longest period of sobriety (when/how long): 16  months  Negative Consequences of Use: Financial, Personal relationships, Work / Youth worker Withdrawal Symptoms: Other (Comment) (restlessness) Name of Substance 1: herion  1 - Age of First Use: Early 20's  1 - Amount (size/oz): 1-1.5 grams  1 - Frequency: daily  1 - Duration: 3 months  1 - Last Use / Amount: yesterday                    Allergies:   Allergies  Allergen Reactions  . Suboxone [Buprenorphine Hcl-Naloxone Hcl] Nausea And Vomiting    Labs:  Results for orders placed or performed during the hospital encounter of 05/22/15 (from the past 48 hour(s))  Comprehensive metabolic panel     Status: Abnormal   Collection Time: 05/22/15 12:39 PM  Result Value Ref Range   Sodium 136 135 - 145 mmol/L   Potassium 4.1 3.5 - 5.1 mmol/L   Chloride 102 101 - 111 mmol/L   CO2 26 22 - 32 mmol/L   Glucose, Bld 120 (H) 65 - 99 mg/dL   BUN 8 6 - 20 mg/dL   Creatinine, Ser 0.92 0.61 - 1.24 mg/dL   Calcium 8.8 (L) 8.9 - 10.3 mg/dL   Total Protein 7.5 6.5 - 8.1 g/dL   Albumin 4.0 3.5 - 5.0 g/dL   AST 27 15 - 41 U/L   ALT 35 17 - 63 U/L   Alkaline Phosphatase 53 38 - 126 U/L   Total Bilirubin 0.5 0.3 - 1.2 mg/dL   GFR calc non Af Amer >60 >60 mL/min   GFR calc Af Amer >60 >60 mL/min    Comment: (NOTE) The eGFR has been calculated using the CKD EPI equation. This calculation has not been validated in all clinical situations. eGFR's persistently <60 mL/min signify possible Chronic Kidney Disease.    Anion gap 8 5 - 15  Ethanol  (ETOH)     Status: None   Collection Time: 05/22/15 12:39 PM  Result Value Ref Range   Alcohol, Ethyl (B) <5 <5 mg/dL    Comment:        LOWEST DETECTABLE LIMIT FOR SERUM ALCOHOL IS 5 mg/dL FOR MEDICAL PURPOSES ONLY   Salicylate level     Status: None   Collection Time: 05/22/15 12:39 PM  Result Value Ref Range   Salicylate Lvl <2.9 2.8 - 30.0 mg/dL  Acetaminophen level     Status: Abnormal   Collection Time: 05/22/15 12:39 PM  Result Value Ref Range   Acetaminophen (  Tylenol), Serum <10 (L) 10 - 30 ug/mL    Comment:        THERAPEUTIC CONCENTRATIONS VARY SIGNIFICANTLY. A RANGE OF 10-30 ug/mL MAY BE AN EFFECTIVE CONCENTRATION FOR MANY PATIENTS. HOWEVER, SOME ARE BEST TREATED AT CONCENTRATIONS OUTSIDE THIS RANGE. ACETAMINOPHEN CONCENTRATIONS >150 ug/mL AT 4 HOURS AFTER INGESTION AND >50 ug/mL AT 12 HOURS AFTER INGESTION ARE OFTEN ASSOCIATED WITH TOXIC REACTIONS.   CBC     Status: Abnormal   Collection Time: 05/22/15 12:39 PM  Result Value Ref Range   WBC 11.7 (H) 3.8 - 10.6 K/uL   RBC 5.23 4.40 - 5.90 MIL/uL   Hemoglobin 15.2 13.0 - 18.0 g/dL   HCT 46.1 40.0 - 52.0 %   MCV 88.1 80.0 - 100.0 fL   MCH 29.1 26.0 - 34.0 pg   MCHC 33.0 32.0 - 36.0 g/dL   RDW 13.3 11.5 - 14.5 %   Platelets 208 150 - 440 K/uL  Urine Drug Screen, Qualitative ([facility] only)     Status: Abnormal   Collection Time: 05/22/15 12:39 PM  Result Value Ref Range   Tricyclic, Ur Screen NONE DETECTED NONE DETECTED   Amphetamines, Ur Screen NONE DETECTED NONE DETECTED   MDMA (Ecstasy)Ur Screen NONE DETECTED NONE DETECTED   Cocaine Metabolite,Ur [place] POSITIVE (A) NONE DETECTED   Opiate, Ur Screen POSITIVE (A) NONE DETECTED   Phencyclidine (PCP) Ur S NONE DETECTED NONE DETECTED   Cannabinoid 50 Ng, Ur [place] POSITIVE (A) NONE DETECTED   Barbiturates, Ur Screen NONE DETECTED NONE DETECTED   Benzodiazepine, Ur Scrn POSITIVE (A) NONE DETECTED   Methadone Scn, Ur POSITIVE (A) NONE DETECTED    Comment: (NOTE) 440   Tricyclics, urine               Cutoff 1000 ng/mL 200  Amphetamines, urine             Cutoff 1000 ng/mL 300  MDMA (Ecstasy), urine           Cutoff 500 ng/mL 400  Cocaine Metabolite, urine       Cutoff 300 ng/mL 500  Opiate, urine                   Cutoff 300 ng/mL 600  Phencyclidine (PCP), urine      Cutoff 25 ng/mL 700  Cannabinoid, urine              Cutoff 50 ng/mL 800  Barbiturates, urine             Cutoff 200 ng/mL 900  Benzodiazepine, urine           Cutoff 200 ng/mL 1000 Methadone, urine                Cutoff 300 ng/mL 1100 1200 The urine drug screen provides only a preliminary, unconfirmed 1300 analytical test result and should not be used for non-medical 1400 purposes. Clinical consideration and professional judgment should 1500 be applied to any positive drug screen result due to possible 1600 interfering substances. A more specific alternate chemical method 1700 must be used in order to obtain a confirmed analytical result.  1800 Gas chromato graphy / mass spectrometry (GC/MS) is the preferred 1900 confirmatory method.     Vitals: Blood pressure 148/89, pulse 78, temperature 98.6 F (37 C), temperature source Oral, resp. rate 20, height '5\' 9"'  (1.753 m), weight 158.759 kg (350 lb), SpO2 100 %.  Risk to Self: Suicidal Ideation: Yes-Currently Present Suicidal Intent: Yes-Currently Present Is patient  at risk for suicide?: Yes Suicidal Plan?: Yes-Currently Present Specify Current Suicidal Plan: Overdose  Access to Means: Yes Specify Access to Suicidal Means: Pt actively using  What has been your use of drugs/alcohol within the last 12 months?: Herion usage for the last 3 months  How many times?: 1 Other Self Harm Risks: NA Triggers for Past Attempts: Unknown Intentional Self Injurious Behavior: None Risk to Others: Homicidal Ideation: No Thoughts of Harm to Others: No Current Homicidal Intent: No Current Homicidal Plan: No Access to Homicidal Means: No History of  harm to others?: No Assessment of Violence: On admission Does patient have access to weapons?: No Criminal Charges Pending?: No Does patient have a court date: No Prior Inpatient Therapy: Prior Inpatient Therapy: Yes Prior Therapy Dates: unknown  Prior Therapy Facilty/Provider(s): [facility]  Reason for Treatment: Detox  Prior Outpatient Therapy: Prior Outpatient Therapy: Yes Prior Therapy Dates: unknown  Prior Therapy Facilty/Provider(s): [facility]  Reason for Treatment: Medication management  Does patient have an ACCT team?: No Does patient have Intensive In-House Services?  : No Does patient have [facility] services? : No Does patient have P4CC services?: No  Current Facility-Administered Medications  Medication Dose Route Frequency Provider Last Rate Last Dose  . cloNIDine (CATAPRES) tablet 0.1 mg  0.1 mg Oral TID Nena Polio, MD   0.1 mg at 05/23/15 1048  . nicotine (NICOTROL) 10 MG inhaler 1 continuous puffing  1 continuous puffing Inhalation PRN Nena Polio, MD   1 continuous puffing at 05/23/15 0855  . QUEtiapine (SEROQUEL) tablet 100 mg  100 mg Oral QHS Delman Kitten, MD   100 mg at 05/23/15 0830   No current outpatient prescriptions on file.    Musculoskeletal: Strength & Muscle Tone: within normal limits Gait & Station: normal Patient leans: N/A  Psychiatric Specialty Exam: Physical Exam  Constitutional: He appears well-developed and well-nourished.  HENT:  Head: Normocephalic and atraumatic.  Eyes: Conjunctivae are normal. Pupils are equal, round, and reactive to light.  Neck: Normal range of motion.  Cardiovascular: Normal heart sounds.   Respiratory: Effort normal.  GI: Soft.  Musculoskeletal: Normal range of motion.  Neurological: He is alert.  Skin: Skin is warm and dry.  Psychiatric: His speech is normal and behavior is normal. Thought content normal. Cognition and memory are normal. He expresses impulsivity. He exhibits a depressed  mood. He expresses no suicidal ideation.    Review of Systems  Constitutional: Negative.   HENT: Negative.   Eyes: Negative.   Respiratory: Negative.   Cardiovascular: Negative.   Gastrointestinal: Negative.   Musculoskeletal: Negative.   Skin: Negative.   Neurological: Negative.   Psychiatric/Behavioral: Positive for substance abuse. Negative for depression, suicidal ideas and hallucinations. The patient is nervous/anxious and has insomnia.     Blood pressure 148/89, pulse 78, temperature 98.6 F (37 C), temperature source Oral, resp. rate 20, height '5\' 9"'  (1.753 m), weight 158.759 kg (350 lb), SpO2 100 %.Body mass index is 51.66 kg/(m^2).  General Appearance: Disheveled  Eye Sport and exercise psychologist::  Fair  Speech:  Clear and Coherent  Volume:  Normal  Mood:  Dysphoric  Affect:  Congruent  Thought Process:  Goal Directed  Orientation:  Full (Time, Place, and Person)  Thought Content:  Negative  Suicidal Thoughts:  No  Homicidal Thoughts:  No  Memory:  Immediate;   Fair Recent;   Fair Remote;   Fair  Judgement:  Fair  Insight:  Fair  Psychomotor Activity:  Decreased  Concentration:  Fair  Recall:  [employer] of Knowledge:Fair  Language: Good  Akathisia:  No  Handed:  Right  AIMS (if indicated):     Assets:  Communication Skills Desire for Improvement Physical Health Resilience  ADL's:  Intact  Cognition: WNL  Sleep:      Medical Decision Making: Review of Psycho-Social Stressors (1), Review or order clinical lab tests (1), Established Problem, Worsening (2), Review or order medicine tests (1) and Review of Medication Regimen & Side Effects (2)  Treatment Plan Summary: Medication management and Plan On reevaluation today suicidal ideation is no longer present. Patient does not appear to be psychotic. He is lucid and able to think through an appropriate plan. Patient is requesting discharge from the hospital and will follow-up outpatient. He will be given prescriptions for Geodon  and Seroquel. Case discussed with the emergency room physician. Involuntary commitment can be discontinued. Psychoeducational counseling completed. Feels safe with the discharge plan. Strongly encouraged to stay off of any relapse into opiate abuse  Plan:  Patient does not meet criteria for psychiatric inpatient admission. Supportive therapy provided about ongoing stressors. Disposition: Monitor and treat in the emergency room following bed availability  Alethia Berthold 05/23/2015 3:39 PM

## 2015-05-31 ENCOUNTER — Emergency Department
Admission: EM | Admit: 2015-05-31 | Discharge: 2015-05-31 | Disposition: A | Discharge status: Home / Self Care | Payer: Medicaid Other | Attending: Emergency Medicine | Admitting: Emergency Medicine

## 2015-05-31 ENCOUNTER — Emergency Department: Payer: Medicaid Other

## 2015-05-31 ENCOUNTER — Encounter: Payer: Self-pay | Admitting: Emergency Medicine

## 2015-05-31 DIAGNOSIS — Y998 Other external cause status: Secondary | ICD-10-CM | POA: Diagnosis not present

## 2015-05-31 DIAGNOSIS — Z720 Tobacco use: Secondary | ICD-10-CM | POA: Insufficient documentation

## 2015-05-31 DIAGNOSIS — Y9289 Other specified places as the place of occurrence of the external cause: Secondary | ICD-10-CM | POA: Insufficient documentation

## 2015-05-31 DIAGNOSIS — Y9389 Activity, other specified: Secondary | ICD-10-CM | POA: Insufficient documentation

## 2015-05-31 DIAGNOSIS — Z79899 Other long term (current) drug therapy: Secondary | ICD-10-CM | POA: Insufficient documentation

## 2015-05-31 DIAGNOSIS — R079 Chest pain, unspecified: Secondary | ICD-10-CM | POA: Diagnosis not present

## 2015-05-31 DIAGNOSIS — T401X1A Poisoning by heroin, accidental (unintentional), initial encounter: Secondary | ICD-10-CM

## 2015-05-31 LAB — COMPREHENSIVE METABOLIC PANEL
ALT: 38 U/L (ref 17–63)
AST: 36 U/L (ref 15–41)
Albumin: 4.2 g/dL (ref 3.5–5.0)
Alkaline Phosphatase: 54 U/L (ref 38–126)
Anion gap: 11 (ref 5–15)
BUN: 9 mg/dL (ref 6–20)
CO2: 23 mmol/L (ref 22–32)
Calcium: 8.8 mg/dL — ABNORMAL LOW (ref 8.9–10.3)
Chloride: 103 mmol/L (ref 101–111)
Creatinine, Ser: 0.98 mg/dL (ref 0.61–1.24)
GFR calc Af Amer: >60 mL/min (ref >60)
GFR calc non Af Amer: >60 mL/min (ref >60)
Glucose, Bld: 120 mg/dL — ABNORMAL HIGH (ref 65–99)
Potassium: 3.5 mmol/L (ref 3.5–5.1)
Sodium: 137 mmol/L (ref 135–145)
Total Bilirubin: 0.6 mg/dL (ref 0.3–1.2)
Total Protein: 8 g/dL (ref 6.5–8.1)

## 2015-05-31 LAB — CBC
HCT: 47.1 % (ref 40.0–52.0)
Hemoglobin: 15.9 g/dL (ref 13.0–18.0)
MCH: 29.4 pg (ref 26.0–34.0)
MCHC: 33.8 g/dL (ref 32.0–36.0)
MCV: 86.8 fL (ref 80.0–100.0)
Platelets: 195 10*3/uL (ref 150–440)
RBC: 5.43 MIL/uL (ref 4.40–5.90)
RDW: 13.2 % (ref 11.5–14.5)
WBC: 11.7 10*3/uL — ABNORMAL HIGH (ref 3.8–10.6)

## 2015-05-31 NOTE — ED Notes (Signed)
Patient transported to X-ray via stretcher 

## 2015-05-31 NOTE — ED Notes (Signed)
Pt arrives via EMS from home. Pt admits to using heroin this evening. Pt's family at home called EMS when pt was found unresponsive. Pt blue, not breathing and pulseless upon police arriving. CPR started by police on scene. Upon EMS arrival, pt given Narcan nasally and IM (  total) and pt then AAOx4 and in NSR. Pt AAOx4 at this time. Pt c/o chest pain. NAD noted. RR even and nonlabored. Pt ambulatory from EMS stretcher to ER stretcher without incident. ER provider at bedside at this time as well. Pt denies alcohol use.

## 2015-05-31 NOTE — ED Provider Notes (Signed)
[facility] Emergency Department Provider Note  Time seen: 8:08 PM  I have reviewed the triage vital signs and the nursing notes.   HISTORY  Chief Complaint Drug Overdose    HPI Randy Woods. is a 34 y.o. male with a past medical history of chronic bronchitis who presents the emergency department after a heroin overdose. According to EMS the patient was injecting heroine, the patient states it was his normal amount of heroin he has been using for the past 3 months. Patient went unconscious, and friends who are in the house also using heroin called 911. First responder arrived (police) and initiated CPR as the patient was pulseless and apneic. EMS arrived and gave the patient 2 mg of Narcan intranasally, and the patient awoke. They state within 10-15 minutes the patient became drowsy again and they gave him 2 additional milligrams of Narcan intramuscular. They convinced the patient to come to the hospital for evaluation. The patient's main complaint is of chest pain (patient received approximately 5 minutes of CPR). Upon arrival to the emergency department the patient is awake alert, oriented, answering all questions appropriately. Patient's only complaint is of chest pain, and feeling like he is going into heroin withdrawal.     Past Medical History  Diagnosis Date  . Bronchitis   . Anxiety   . Depression   . Drug abuse     Patient Active Problem List   Diagnosis Date Noted  . Severe recurrent major depression without psychotic features 05/22/2015  . Opiate abuse, continuous 05/22/2015  . Cocaine abuse 05/22/2015    Past Surgical History  Procedure Laterality Date  . Nose surgery      Current Outpatient Rx  Name  Route  Sig  Dispense  Refill  . QUEtiapine (SEROQUEL) 100 MG tablet   Oral   Take 1 tablet (100 mg total) by mouth at bedtime.   30 tablet   0   . ziprasidone (GEODON) 40 MG capsule   Oral   Take 1 capsule (40 mg total) by mouth 2  (two) times daily with a meal.   60 capsule   0     Allergies Suboxone  Family History  Problem Relation Age of Onset  . Diabetes Mother   . Hypertension Father   . Kidney failure Father   . Diabetes Father     Social History Social History  Substance Use Topics  . Smoking status: Current Every Day Smoker  . Smokeless tobacco: Not on file  . Alcohol Use: No    Review of Systems Constitutional: Negative for fever Cardiovascular: Positive for chest pain. Respiratory: Negative for shortness of breath. Gastrointestinal: Negative for abdominal pain 10-point ROS otherwise negative.  ____________________________________________   PHYSICAL EXAM:  Constitutional: Alert and oriented. Well appearing and in no distress. Eyes: Normal exam, 2 mm PERRL  ENT   Head: Normocephalic and atraumatic.   Mouth/Throat: Mucous membranes are moist. Cardiovascular: Normal rate, regular rhythm. No murmur Respiratory: Normal respiratory effort without tachypnea nor retractions. Breath sounds are clear and equal bilaterally. No wheezes/rales/rhonchi. Moderate tenderness to palpation of the sternum. Gastrointestinal: Soft and nontender. No distention.   Musculoskeletal: Nontender with normal range of motion in all extremities. Tract marks in bilateral upper extremities. Neurologic:  Normal speech and language. No gross focal neurologic deficits are appreciated. Speech is normal. Skin:  Skin is warm, dry and intact.  Psychiatric: Mood and affect are normal. Speech and behavior are normal. Patient exhibits appropriate insight and  judgment.  ____________________________________________      RADIOLOGY  Chest x-ray shows no acute abnormality.  ____________________________________________   INITIAL IMPRESSION / ASSESSMENT AND PLAN / ED COURSE  Pertinent labs & imaging results that were available during my care of the patient were reviewed by me and considered in my medical decision  making (see chart for details).  Patient just the emergency department after an accidental heroin overdose. Denies any SI. Friends called EMS. Patient did receive 5 minutes of CPR prior to Narcan administration. Patient now awake, alert, oriented, no distress. We will check labs, chest x-ray, and monitor in the emergency department to ensure that patient remains awake once Narcan wears off.  X-ray shows no acute abnormality. Labs are within normal limits. Patient is asking to be discharged home. He has been monitored in the emergency department nearly 2 hours, remains alert and oriented 4. Patient is here with his mother with whom he lives, and she will continue to watch him tonight. Patient will be discharged home into his mother's care. Patient and mother are agreeable to this plan.    ____________________________________________   FINAL CLINICAL IMPRESSION(S) / ED DIAGNOSES  Heroin overdose   Minna Antis, MD 05/31/15 2140

## 2015-05-31 NOTE — Discharge Instructions (Signed)
As we discussed please stay with friends or family tonight who can keep a very close eye on you. Return to the emergency department for any somnolence, or any trouble breathing, or any other symptom personally concerning to yourself or family.    Accidental Overdose A drug overdose occurs when a chemical substance (drug or medication) is used in amounts large enough to overcome a person. This may result in severe illness or death. This is a type of poisoning. Accidental overdoses of medications or other substances come from a variety of reasons. When this happens accidentally, it is often because the person taking the substance does not know enough about what they have taken. Drugs which commonly cause overdose deaths are alcohol, psychotropic medications (medications which affect the mind), pain medications, illegal drugs (street drugs) such as cocaine and heroin, and multiple drugs taken at the same time. It may result from careless behavior (such as over-indulging at a party). Other causes of overdose may include multiple drug use, a lapse in memory, or drug use after a period of no drug use.  Sometimes overdosing occurs because a person cannot remember if they have taken their medication.  A common unintentional overdose in young children involves multi-vitamins containing iron. Iron is a part of the hemoglobin molecule in blood. It is used to transport oxygen to living cells. When taken in small amounts, iron allows the body to restock hemoglobin. In large amounts, it causes problems in the body. If this overdose is not treated, it can lead to death. Never take medicines that show signs of tampering or do not seem quite right. Never take medicines in the dark or in poor lighting. Read the label and check each dose of medicine before you take it. When adults are poisoned, it happens most often through carelessness or lack of information. Taking medicines in the dark or taking medicine prescribed for  someone else to treat the same type of problem is a dangerous practice. SYMPTOMS  Symptoms of overdose depend on the medication and amount taken. They can vary from over-activity with stimulant over-dosage, to sleepiness from depressants such as alcohol, narcotics and tranquilizers. Confusion, dizziness, nausea and vomiting may be present. If problems are severe enough coma and death may result. DIAGNOSIS  Diagnosis and management are generally straightforward if the drug is known. Otherwise it is more difficult. At times, certain symptoms and signs exhibited by the patient, or blood tests, can reveal the drug in question.  TREATMENT  In an emergency department, most patients can be treated with supportive measures. Antidotes may be available if there has been an overdose of opioids or benzodiazepines. A rapid improvement will often occur if this is the cause of overdose. At home or away from medical care:  There may be no immediate problems or warning signs in children.  Not everything works well in all cases of poisoning.  Take immediate action. Poisons may act quickly.  If you think someone has swallowed medicine or a household product, and the person is unconscious, having seizures (convulsions), or is not breathing, immediately call for an ambulance. IF a person is conscious and appears to be doing OK but has swallowed a poison:  Do not wait to see what effect the poison will have. Immediately call a poison control center (listed in the white pages of your telephone book under "Poison Control" or inside the front cover with other emergency numbers). Some poison control centers have TTY capability for the deaf. Check with your local  center if you or someone in your family requires this service.  Keep the container so you can read the label on the product for ingredients.  Describe what, when, and how much was taken and the age and condition of the person poisoned. Inform them if the person  is vomiting, choking, drowsy, shows a change in color or temperature of skin, is conscious or unconscious, or is convulsing.  Do not cause vomiting unless instructed by medical personnel. Do not induce vomiting or force liquids into a person who is convulsing, unconscious, or very drowsy. Stay calm and in control.   Activated charcoal also is sometimes used in certain types of poisoning and you may wish to add a supply to your emergency medicines. It is available without a prescription. Call a poison control center before using this medication. PREVENTION  Thousands of children die every year from unintentional poisoning. This may be from household chemicals, poisoning from carbon monoxide in a car, taking their parent's medications, or simply taking a few iron pills or vitamins with iron. Poisoning comes from unexpected sources.  Store medicines out of the sight and reach of children, preferably in a locked cabinet. Do not keep medications in a food cabinet. Always store your medicines in a secure place. Get rid of expired medications.  If you have children living with you or have them as occasional guests, you should have child-resistant caps on your medicine containers. Keep everything out of reach. Child proof your home.  If you are called to the telephone or to answer the door while you are taking a medicine, take the container with you or put the medicine out of the reach of small children.  Do not take your medication in front of children. Do not tell your child how good a medication is and how good it is for them. They may get the idea it is more of a treat.  If you are an adult and have accidentally taken an overdose, you need to consider how this happened and what can be done to prevent it from happening again. If this was from a street drug or alcohol, determine if there is a problem that needs addressing. If you are not sure a problems exists, it is easy to talk to a professional and ask  them if they think you have a problem. It is better to handle this problem in this way before it happens again and has a much worse consequence. Document Released: 11/13/2004 Document Revised: 11/22/2011 Document Reviewed: 04/21/2009 [employer] Patient Information 2015 [place], [place]. This information is not intended to replace advice given to you by your health care provider. Make sure you discuss any questions you have with your health care provider.

## 2016-01-27 ENCOUNTER — Emergency Department
Admission: EM | Admit: 2016-01-27 | Discharge: 2016-01-27 | Disposition: A | Discharge status: Home / Self Care | Payer: Medicaid Other | Attending: Emergency Medicine | Admitting: Emergency Medicine

## 2016-01-27 ENCOUNTER — Encounter: Payer: Self-pay | Admitting: Emergency Medicine

## 2016-01-27 DIAGNOSIS — F17200 Nicotine dependence, unspecified, uncomplicated: Secondary | ICD-10-CM | POA: Diagnosis not present

## 2016-01-27 DIAGNOSIS — J029 Acute pharyngitis, unspecified: Secondary | ICD-10-CM

## 2016-01-27 DIAGNOSIS — H9203 Otalgia, bilateral: Secondary | ICD-10-CM

## 2016-01-27 DIAGNOSIS — F329 Major depressive disorder, single episode, unspecified: Secondary | ICD-10-CM | POA: Insufficient documentation

## 2016-01-27 DIAGNOSIS — Z79899 Other long term (current) drug therapy: Secondary | ICD-10-CM | POA: Insufficient documentation

## 2016-01-27 DIAGNOSIS — J039 Acute tonsillitis, unspecified: Secondary | ICD-10-CM | POA: Insufficient documentation

## 2016-01-27 DIAGNOSIS — R0981 Nasal congestion: Secondary | ICD-10-CM | POA: Diagnosis present

## 2016-01-27 LAB — CBC WITH DIFFERENTIAL/PLATELET
Basophils Absolute: 0.1 10*3/uL (ref 0–0.1)
Basophils Relative: 1 %
Eosinophils Absolute: 0.2 10*3/uL (ref 0–0.7)
Eosinophils Relative: 2 %
HCT: 47.9 % (ref 40.0–52.0)
Hemoglobin: 16.2 g/dL (ref 13.0–18.0)
Lymphocytes Relative: 28 %
Lymphs Abs: 3 10*3/uL (ref 1.0–3.6)
MCH: 29.6 pg (ref 26.0–34.0)
MCHC: 33.8 g/dL (ref 32.0–36.0)
MCV: 87.6 fL (ref 80.0–100.0)
Monocytes Absolute: 1.2 10*3/uL — ABNORMAL HIGH (ref 0.2–1.0)
Monocytes Relative: 11 %
Neutro Abs: 6.3 10*3/uL (ref 1.4–6.5)
Neutrophils Relative %: 58 %
Platelets: 207 10*3/uL (ref 150–440)
RBC: 5.47 MIL/uL (ref 4.40–5.90)
RDW: 13.7 % (ref 11.5–14.5)
WBC: 10.6 10*3/uL (ref 3.8–10.6)

## 2016-01-27 LAB — BASIC METABOLIC PANEL
Anion gap: 8 (ref 5–15)
BUN: 12 mg/dL (ref 6–20)
CO2: 27 mmol/L (ref 22–32)
Calcium: 8.9 mg/dL (ref 8.9–10.3)
Chloride: 102 mmol/L (ref 101–111)
Creatinine, Ser: 0.88 mg/dL (ref 0.61–1.24)
GFR calc Af Amer: >60 mL/min (ref >60)
GFR calc non Af Amer: >60 mL/min (ref >60)
Glucose, Bld: 89 mg/dL (ref 65–99)
Potassium: 4.3 mmol/L (ref 3.5–5.1)
Sodium: 137 mmol/L (ref 135–145)

## 2016-01-27 LAB — MONONUCLEOSIS SCREEN: Mono Screen: NEGATIVE

## 2016-01-27 LAB — POCT RAPID STREP A: Streptococcus, Group A Screen (Direct): NEGATIVE

## 2016-01-27 MED ORDER — DEXAMETHASONE SODIUM PHOSPHATE 10 MG/ML IJ SOLN
10.0000 mg | Freq: Once | INTRAMUSCULAR | Status: AC
  Administered 2016-01-27: 10 mg via INTRAMUSCULAR
  Filled 2016-01-27: qty 1

## 2016-01-27 MED ORDER — AMOXICILLIN 500 MG PO CAPS
500.0000 mg | ORAL_CAPSULE | Freq: Once | ORAL | Status: AC
  Administered 2016-01-27: 500 mg via ORAL
  Filled 2016-01-27: qty 1

## 2016-01-27 MED ORDER — AMOXICILLIN 500 MG PO CAPS: 500.0000 mg | ORAL_CAPSULE | Freq: Three times a day (TID) | ORAL | Status: DC

## 2016-01-27 MED ORDER — PREDNISONE 10 MG PO TABS: 20.0000 mg | ORAL_TABLET | Freq: Every day | ORAL | Status: DC

## 2016-01-27 NOTE — ED Provider Notes (Signed)
[facility] Emergency Department Provider Note ____________________________________________  Time seen: 2016  I have reviewed the triage vital signs and the nursing notes.  HISTORY  Chief Complaint  Otalgia and Nasal Congestion  HPI Randy Woods. is a 35 y.o. male presents to the ED for 4 days complaint of sore throat, redness, and swelling. The patient describes pain with eating and drinking. He denies any intermittent fevers, chills, sweats. He denies any nausea, vomiting, or dizziness. He denies any previous exposure or diagnosis of strep infection. He also has a more chronic complaint of bilateral ear pressure and fullness. He describes that the duration has been for several months. He has been [facility] for further evaluation. He has been takingibuprofen, Benadryl, and pseudoephedrine, intermittently for symptom relief. He rates his sore throat pain at 10/10.  Past Medical History  Diagnosis Date  . Bronchitis   . Anxiety   . Depression   . Drug abuse     Patient Active Problem List   Diagnosis Date Noted  . Severe recurrent major depression without psychotic features (HCC) 05/22/2015  . Opiate abuse, continuous 05/22/2015  . Cocaine abuse 05/22/2015    Past Surgical History  Procedure Laterality Date  . Nose surgery    . Tympanoplasty      Current Outpatient Rx  Name  Route  Sig  Dispense  Refill  . buprenorphine-naloxone (SUBOXONE) 8-2 MG SUBL SL tablet   Sublingual   Place 1 tablet under the tongue 2 (two) times daily.         Marland Kitchen amoxicillin (AMOXIL) 500 MG capsule   Oral   Take 1 capsule (500 mg total) by mouth 3 (three) times daily.   30 capsule   0   . predniSONE (DELTASONE) 10 MG tablet   Oral   Take 2 tablets (20 mg total) by mouth daily with breakfast.   10 tablet   0   . QUEtiapine (SEROQUEL) 100 MG tablet   Oral   Take 1 tablet (100 mg total) by mouth at bedtime.   30 tablet   0   .  ziprasidone (GEODON) 40 MG capsule   Oral   Take 1 capsule (40 mg total) by mouth 2 (two) times daily with a meal.   60 capsule   0     Allergies Bactrim and Sulfa antibiotics  Family History  Problem Relation Age of Onset  . Diabetes Mother   . Hypertension Father   . Kidney failure Father   . Diabetes Father     Social History Social History  Substance Use Topics  . Smoking status: Current Every Day Smoker  . Smokeless tobacco: None  . Alcohol Use: Yes     Comment: occass.    Review of Systems  Constitutional: Negative for fever. Eyes: Negative for visual changes. ENT: Positive for sore throat. Bilateral earaches.  Gastrointestinal: Negative for abdominal pain, vomiting and diarrhea. Skin: Negative for rash. Neurological: Negative for headaches, focal weakness or numbness. ____________________________________________  PHYSICAL EXAM:  VITAL SIGNS: ED Triage Vitals  Enc Vitals Group     BP 01/27/16 1941 149/100 mmHg     Pulse Rate 01/27/16 1941 100     Resp 01/27/16 1941 20     Temp 01/27/16 1941 98.4 F (36.9 C)     Temp Source 01/27/16 1941 Oral     SpO2 01/27/16 1941 99 %     Weight 01/27/16 1941 320 lb (145.151 kg)  Height 01/27/16 1941 5\' 9"  (1.753 m)     Head Cir --      Peak Flow --      Pain Score 01/27/16 1938 10     Pain Loc --      Pain Edu? --      Excl. in GC? --    Constitutional: Alert and oriented. Well appearing and in no distress. Head: Normocephalic and atraumatic.      Eyes: Conjunctivae are normal. PERRL. Normal extraocular movements      Ears: Canals clear. TMs intact bilaterally. mildly retracted with a minimal effusion on the left.   Nose: No congestion/rhinorrhea.   Mouth/Throat: Mucous membranes are moist. Uvula is erythematous and midline without edema. The oropharynx  and tonsils are moderately erythematous. Tonsils are 2+ without edema or exudate. Post-nasal drainage noted.    Neck: Supple. No  thyromegaly. Hematological/Lymphatic/Immunological: No cervical lymphadenopathy. Cardiovascular: Normal rate, regular rhythm.  Respiratory: Normal respiratory effort. No wheezes/rales/rhonchi. Gastrointestinal: Soft and nontender. No distention. Musculoskeletal: Nontender with normal range of motion in all extremities.  ____________________________________________   LABS (pertinent positives/negatives) Labs Reviewed  CBC WITH DIFFERENTIAL/PLATELET - Abnormal; Notable for the following:    Monocytes Absolute 1.2 (*)    All other components within normal limits  CULTURE, GROUP A STREP [facility]  MONONUCLEOSIS SCREEN  BASIC METABOLIC PANEL  POCT RAPID STREP A  ___________________________________________  PROCEDURES  Decadron 10 mg IM Amoxil 500 mg PO ____________________________________________  INITIAL IMPRESSION / ASSESSMENT AND PLAN / ED COURSE  Patient with acute tonsillitis of unspecified etiology. Rapid strep testing and mono testing are negative tonight. Patient is advised that throat culture is pending at the time of his discharge. He'll be discharged with a prescription for prednisone dose as directed. He also be provided with amoxicillin prescription which he will dosing starting tomorrow. He is advised that he may be notified with the results of his throat culture in 24 hours. He should follow-up with ENT as scheduled and see his primary care provider in the interim. Return to the ED for acutely worsening symptoms as discussed. ____________________________________________  FINAL CLINICAL IMPRESSION(S) / ED DIAGNOSES  Final diagnoses:  Sore throat  Acute tonsillitis, unspecified etiology  Otalgia of both ears      Lissa HoardJenise V Bacon Herta Hink, PA-C 01/27/16 2244  Emily FilbertJonathan E Williams, MD 01/27/16 [id number]

## 2016-01-27 NOTE — ED Notes (Signed)
Patient ambulatory to triage with steady gait, without difficulty or distress noted; pt reports sinus congestion, bilat earache and pain to roof of mouth & throat for several months

## 2016-01-27 NOTE — Discharge Instructions (Signed)
Earache An earache, also called otalgia, can be caused by many things. Pain from an earache can be sharp, dull, or burning. The pain may be temporary or constant. Earaches can be caused by problems with the ear, such as infection in either the middle ear or the ear canal, injury, impacted ear wax, middle ear pressure, or a foreign body in the ear. Ear pain can also result from problems in other areas. This is called referred pain. For example, pain can come from a sore throat, a tooth infection, or problems with the jaw or the joint between the jaw and the skull (temporomandibular joint, or TMJ). The cause of an earache is not always easy to identify. Watchful waiting may be appropriate for some earaches until a clear cause of the pain can be found. HOME CARE INSTRUCTIONS Watch your condition for any changes. The following actions may help to lessen any discomfort that you are feeling:  Take medicines only as directed by your health care provider. This includes ear drops.  Apply ice to your outer ear to help reduce pain.  Put ice in a plastic bag.  Place a towel between your skin and the bag.  Leave the ice on for 20 minutes, 2-3 times per day.  Do not put anything in your ear other than medicine that is prescribed by your health care provider.  Try resting in an upright position instead of lying down. This may help to reduce pressure in the middle ear and relieve pain.  Chew gum if it helps to relieve your ear pain.  Control any allergies that you have.  Keep all follow-up visits as directed by your health care provider. This is important. SEEK MEDICAL CARE IF:  Your pain does not improve within 2 days.  You have a fever.  You have new or worsening symptoms. SEEK IMMEDIATE MEDICAL CARE IF:  You have a severe headache.  You have a stiff neck.  You have difficulty swallowing.  You have redness or swelling behind your ear.  You have drainage from your ear.  You have hearing  loss.  You feel dizzy.   This information is not intended to replace advice given to you by your health care provider. Make sure you discuss any questions you have with your health care provider.   Document Released: 04/16/2004 Document Revised: 09/20/2014 Document Reviewed: 03/31/2014 Elsevier Interactive Patient Education 2016 [employer]  Tonsillitis Tonsillitis is an infection of the throat that causes the tonsils to become red, tender, and swollen. Tonsils are collections of lymphoid tissue at the back of the throat. Each tonsil has crevices (crypts). Tonsils help fight nose and throat infections and keep infection from spreading to other parts of the body for the first 18 months of life.  CAUSES Sudden (acute) tonsillitis is usually caused by infection with streptococcal bacteria. Long-lasting (chronic) tonsillitis occurs when the crypts of the tonsils become filled with pieces of food and bacteria, which makes it easy for the tonsils to become repeatedly infected. SYMPTOMS  Symptoms of tonsillitis include:  A sore throat, with possible difficulty swallowing.  White patches on the tonsils.  Fever.  Tiredness.  New episodes of snoring during sleep, when you did not snore before.  Small, foul-smelling, yellowish-white pieces of material (tonsilloliths) that you occasionally cough up or spit out. The tonsilloliths can also cause you to have bad breath. DIAGNOSIS Tonsillitis can be diagnosed through a physical exam. Diagnosis can be confirmed with the results of lab tests, including a  throat culture. TREATMENT  The goals of tonsillitis treatment include the reduction of the severity and duration of symptoms and prevention of associated conditions. Symptoms of tonsillitis can be improved with the use of steroids to reduce the swelling. Tonsillitis caused by bacteria can be treated with antibiotic medicines. Usually, treatment with antibiotic medicines is started before the cause of  the tonsillitis is known. However, if it is determined that the cause is not bacterial, antibiotic medicines will not treat the tonsillitis. If attacks of tonsillitis are severe and frequent, your health care provider may recommend surgery to remove the tonsils (tonsillectomy). HOME CARE INSTRUCTIONS   Rest as much as possible and get plenty of sleep.  Drink plenty of fluids. While the throat is very sore, eat soft foods or liquids, such as sherbet, soups, or instant breakfast drinks.  Eat frozen ice pops.  Gargle with a warm or cold liquid to help soothe the throat. Mix 1/4 teaspoon of salt and 1/4 teaspoon of baking soda in 8 oz of water. SEEK MEDICAL CARE IF:   Large, tender lumps develop in your neck.  A rash develops.  A green, yellow-brown, or bloody substance is coughed up.  You are unable to swallow liquids or food for 24 hours.  You notice that only one of the tonsils is swollen. SEEK IMMEDIATE MEDICAL CARE IF:   You develop any new symptoms such as vomiting, severe headache, stiff neck, chest pain, or trouble breathing or swallowing.  You have severe throat pain along with drooling or voice changes.  You have severe pain, unrelieved with recommended medications.  You are unable to fully open the mouth.  You develop redness, swelling, or severe pain anywhere in the neck.  You have a fever. MAKE SURE YOU:   Understand these instructions.  Will watch your condition.  Will get help right away if you are not doing well or get worse.   This information is not intended to replace advice given to you by your health care provider. Make sure you discuss any questions you have with your health care provider.   Document Released: 06/09/2005 Document Revised: 09/20/2014 Document Reviewed: 02/16/2013 Elsevier Interactive Patient Education 2016 [employer].   Your rapid strep test and mono test are negative today. You have a throat culture pending, which may confirm you  have strep. You should dose the antibiotic as directed. If your throat culture is negative, you may stop the antibiotic, because you like have a viral infection. Mono tests may be negative for several weeks after onset of symptoms. Follow-up with Dr. Sherrie Mustache or your ENT specialist for continued symptoms. Take the steroid as directed.

## 2016-01-27 NOTE — ED Notes (Signed)
Patient with two months of bilateral ear pain and now throat pain. States throat is painful and is difficult to swallow liquids.

## 2016-01-29 LAB — CULTURE, GROUP A STREP (THRC)

## 2016-04-22 ENCOUNTER — Encounter (HOSPITAL_COMMUNITY): Payer: Self-pay | Admitting: Emergency Medicine

## 2016-04-22 DIAGNOSIS — F17200 Nicotine dependence, unspecified, uncomplicated: Secondary | ICD-10-CM | POA: Diagnosis not present

## 2016-04-22 DIAGNOSIS — F1120 Opioid dependence, uncomplicated: Secondary | ICD-10-CM | POA: Diagnosis not present

## 2016-04-22 DIAGNOSIS — F1420 Cocaine dependence, uncomplicated: Secondary | ICD-10-CM | POA: Insufficient documentation

## 2016-04-22 DIAGNOSIS — Z5321 Procedure and treatment not carried out due to patient leaving prior to being seen by health care provider: Secondary | ICD-10-CM | POA: Insufficient documentation

## 2016-04-22 LAB — CBC WITH DIFFERENTIAL/PLATELET
Basophils Absolute: 0 10*3/uL (ref 0.0–0.1)
Basophils Relative: 0 %
Eosinophils Absolute: 0.2 10*3/uL (ref 0.0–0.7)
Eosinophils Relative: 2 %
HCT: 44.1 % (ref 39.0–52.0)
Hemoglobin: 14.9 g/dL (ref 13.0–17.0)
Lymphocytes Relative: 26 %
Lymphs Abs: 3.1 10*3/uL (ref 0.7–4.0)
MCH: 30.2 pg (ref 26.0–34.0)
MCHC: 33.8 g/dL (ref 30.0–36.0)
MCV: 89.5 fL (ref 78.0–100.0)
Monocytes Absolute: 0.5 10*3/uL (ref 0.1–1.0)
Monocytes Relative: 4 %
Neutro Abs: 8.1 10*3/uL — ABNORMAL HIGH (ref 1.7–7.7)
Neutrophils Relative %: 68 %
Platelets: 187 10*3/uL (ref 150–400)
RBC: 4.93 MIL/uL (ref 4.22–5.81)
RDW: 12.5 % (ref 11.5–15.5)
WBC: 11.8 10*3/uL — ABNORMAL HIGH (ref 4.0–10.5)

## 2016-04-22 NOTE — ED Triage Notes (Signed)
Pt. 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

## 2016-04-23 ENCOUNTER — Emergency Department (HOSPITAL_COMMUNITY)
Admission: EM | Admit: 2016-04-23 | Discharge: 2016-04-23 | Disposition: A | Discharge status: Home / Self Care | Payer: Medicaid Other | Attending: Dermatology | Admitting: Dermatology

## 2016-04-23 HISTORY — DX: Cocaine abuse, uncomplicated: F14.10

## 2016-04-23 HISTORY — DX: Opioid abuse, uncomplicated: F11.10

## 2016-04-23 LAB — COMPREHENSIVE METABOLIC PANEL
ALT: 50 U/L (ref 17–63)
AST: 31 U/L (ref 15–41)
Albumin: 3.6 g/dL (ref 3.5–5.0)
Alkaline Phosphatase: 52 U/L (ref 38–126)
Anion gap: 8 (ref 5–15)
BUN: 14 mg/dL (ref 6–20)
CO2: 26 mmol/L (ref 22–32)
Calcium: 9.2 mg/dL (ref 8.9–10.3)
Chloride: 101 mmol/L (ref 101–111)
Creatinine, Ser: 1.03 mg/dL (ref 0.61–1.24)
GFR calc Af Amer: >60 mL/min (ref >60)
GFR calc non Af Amer: >60 mL/min (ref >60)
Glucose, Bld: 130 mg/dL — ABNORMAL HIGH (ref 65–99)
Potassium: 3.9 mmol/L (ref 3.5–5.1)
Sodium: 135 mmol/L (ref 135–145)
Total Bilirubin: 0.4 mg/dL (ref 0.3–1.2)
Total Protein: 6.9 g/dL (ref 6.5–8.1)

## 2016-04-23 LAB — RAPID URINE DRUG SCREEN, HOSP PERFORMED
Amphetamines: NOT DETECTED
Barbiturates: NOT DETECTED
Benzodiazepines: POSITIVE — AB
Cocaine: POSITIVE — AB
Opiates: POSITIVE — AB
Tetrahydrocannabinol: NOT DETECTED

## 2016-04-23 LAB — ETHANOL: Alcohol, Ethyl (B): <5 mg/dL (ref <5)

## 2016-04-23 NOTE — ED Notes (Signed)
Called for vitals recheck x2. No answer.

## 2016-05-05 ENCOUNTER — Emergency Department: Payer: Medicaid Other

## 2016-05-05 ENCOUNTER — Emergency Department
Admission: EM | Admit: 2016-05-05 | Discharge: 2016-05-05 | Disposition: A | Discharge status: Home / Self Care | Payer: Medicaid Other | Source: Home / Self Care | Attending: Emergency Medicine | Admitting: Emergency Medicine

## 2016-05-05 ENCOUNTER — Emergency Department
Admit: 2016-05-05 | Discharge: 2016-05-05 | Disposition: A | Discharge status: Home / Self Care | Payer: Medicaid Other | Attending: Emergency Medicine | Admitting: Emergency Medicine

## 2016-05-05 ENCOUNTER — Encounter: Payer: Self-pay | Admitting: *Deleted

## 2016-05-05 DIAGNOSIS — M542 Cervicalgia: Secondary | ICD-10-CM | POA: Insufficient documentation

## 2016-05-05 DIAGNOSIS — J111 Influenza due to unidentified influenza virus with other respiratory manifestations: Secondary | ICD-10-CM | POA: Insufficient documentation

## 2016-05-05 DIAGNOSIS — F17200 Nicotine dependence, unspecified, uncomplicated: Secondary | ICD-10-CM

## 2016-05-05 DIAGNOSIS — Z5181 Encounter for therapeutic drug level monitoring: Secondary | ICD-10-CM | POA: Insufficient documentation

## 2016-05-05 DIAGNOSIS — R509 Fever, unspecified: Secondary | ICD-10-CM

## 2016-05-05 DIAGNOSIS — R69 Illness, unspecified: Principal | ICD-10-CM

## 2016-05-05 LAB — CBC WITH DIFFERENTIAL/PLATELET
Basophils Absolute: 0.1 10*3/uL (ref 0–0.1)
Basophils Relative: 1 %
Eosinophils Absolute: 0 10*3/uL (ref 0–0.7)
Eosinophils Relative: 0 %
HCT: 45.6 % (ref 40.0–52.0)
Hemoglobin: 15.9 g/dL (ref 13.0–18.0)
Lymphocytes Relative: 14 %
Lymphs Abs: 1.8 10*3/uL (ref 1.0–3.6)
MCH: 30.4 pg (ref 26.0–34.0)
MCHC: 35 g/dL (ref 32.0–36.0)
MCV: 87 fL (ref 80.0–100.0)
Monocytes Absolute: 1.2 10*3/uL — ABNORMAL HIGH (ref 0.2–1.0)
Monocytes Relative: 9 %
Neutro Abs: 10 10*3/uL — ABNORMAL HIGH (ref 1.4–6.5)
Neutrophils Relative %: 76 %
Platelets: 148 10*3/uL — ABNORMAL LOW (ref 150–440)
RBC: 5.24 MIL/uL (ref 4.40–5.90)
RDW: 12.8 % (ref 11.5–14.5)
WBC: 13.1 10*3/uL — ABNORMAL HIGH (ref 3.8–10.6)

## 2016-05-05 LAB — COMPREHENSIVE METABOLIC PANEL
ALT: 47 U/L (ref 17–63)
AST: 40 U/L (ref 15–41)
Albumin: 4.1 g/dL (ref 3.5–5.0)
Alkaline Phosphatase: 54 U/L (ref 38–126)
Anion gap: 7 (ref 5–15)
BUN: 12 mg/dL (ref 6–20)
CO2: 25 mmol/L (ref 22–32)
Calcium: 8.7 mg/dL — ABNORMAL LOW (ref 8.9–10.3)
Chloride: 99 mmol/L — ABNORMAL LOW (ref 101–111)
Creatinine, Ser: 0.83 mg/dL (ref 0.61–1.24)
GFR calc Af Amer: >60 mL/min (ref >60)
GFR calc non Af Amer: >60 mL/min (ref >60)
Glucose, Bld: 127 mg/dL — ABNORMAL HIGH (ref 65–99)
Potassium: 4.2 mmol/L (ref 3.5–5.1)
Sodium: 131 mmol/L — ABNORMAL LOW (ref 135–145)
Total Bilirubin: 0.7 mg/dL (ref 0.3–1.2)
Total Protein: 8 g/dL (ref 6.5–8.1)

## 2016-05-05 LAB — URINE DRUG SCREEN, QUALITATIVE (ARMC ONLY)
Amphetamines, Ur Screen: NOT DETECTED
Barbiturates, Ur Screen: NOT DETECTED
Benzodiazepine, Ur Scrn: NOT DETECTED
Cannabinoid 50 Ng, Ur ~~LOC~~: NOT DETECTED
Cocaine Metabolite,Ur ~~LOC~~: NOT DETECTED
MDMA (Ecstasy)Ur Screen: NOT DETECTED
Methadone Scn, Ur: NOT DETECTED
Opiate, Ur Screen: POSITIVE — AB
Phencyclidine (PCP) Ur S: NOT DETECTED
Tricyclic, Ur Screen: NOT DETECTED

## 2016-05-05 LAB — BLOOD GAS, VENOUS
Acid-Base Excess: 3.1 mmol/L — ABNORMAL HIGH (ref 0.0–3.0)
Bicarbonate: 27 mEq/L (ref 21.0–28.0)
O2 Saturation: 93.3 %
Patient temperature: 37
pCO2, Ven: 38 mmHg — ABNORMAL LOW (ref 44.0–60.0)
pH, Ven: 7.46 — ABNORMAL HIGH (ref 7.320–7.430)
pO2, Ven: 64 mmHg — ABNORMAL HIGH (ref 31.0–45.0)

## 2016-05-05 LAB — URINALYSIS COMPLETE WITH MICROSCOPIC (ARMC ONLY)
Bacteria, UA: NONE SEEN
Bilirubin Urine: NEGATIVE
Glucose, UA: NEGATIVE mg/dL
Ketones, ur: NEGATIVE mg/dL
Leukocytes, UA: NEGATIVE
Nitrite: NEGATIVE
Protein, ur: NEGATIVE mg/dL
Specific Gravity, Urine: 1.015 (ref 1.005–1.030)
Squamous Epithelial / LPF: NONE SEEN
pH: 8 (ref 5.0–8.0)

## 2016-05-05 LAB — SEDIMENTATION RATE: Sed Rate: 14 mm/hr (ref 0–15)

## 2016-05-05 LAB — TROPONIN I: Troponin I: <0.03 ng/mL (ref <0.03)

## 2016-05-05 LAB — ECHOCARDIOGRAM COMPLETE
Height: 69 in
Weight: 4640 oz

## 2016-05-05 LAB — LACTIC ACID, PLASMA: Lactic Acid, Venous: 1.6 mmol/L (ref 0.5–1.9)

## 2016-05-05 MED ORDER — PIPERACILLIN-TAZOBACTAM 3.375 G IVPB 30 MIN
3.3750 g | Freq: Once | INTRAVENOUS | Status: AC
  Administered 2016-05-05: 3.375 g via INTRAVENOUS
  Filled 2016-05-05: qty 50

## 2016-05-05 MED ORDER — CLINDAMYCIN HCL 300 MG PO CAPS: 300.0000 mg | ORAL_CAPSULE | Freq: Three times a day (TID) | ORAL | 0 refills | Status: DC

## 2016-05-05 MED ORDER — SODIUM CHLORIDE 0.9 % IV SOLN
Freq: Once | INTRAVENOUS | Status: AC
  Administered 2016-05-05: 10:00:00 via INTRAVENOUS

## 2016-05-05 MED ORDER — VANCOMYCIN HCL IN DEXTROSE 1-5 GM/200ML-% IV SOLN
1000.0000 mg | Freq: Once | INTRAVENOUS | Status: AC
  Administered 2016-05-05: 1000 mg via INTRAVENOUS
  Filled 2016-05-05: qty 200

## 2016-05-05 MED ORDER — ACETAMINOPHEN 500 MG PO TABS
1000.0000 mg | ORAL_TABLET | Freq: Once | ORAL | Status: AC
  Administered 2016-05-05: 1000 mg via ORAL
  Filled 2016-05-05: qty 2

## 2016-05-05 NOTE — ED Triage Notes (Signed)
States he has generalized body aches and fever since last night, states he took motrin last night, states he is an IV drug user and is afraid of some sort of infection

## 2016-05-05 NOTE — ED Notes (Signed)
Pt has hx IV heroin use last 2 days ago. Has had fever last 2 days. general body aches. Denies any specific pain. Has chronic cough per pt.

## 2016-05-05 NOTE — ED Provider Notes (Addendum)
[facility] Emergency Department Provider Note        Time seen: ----------------------------------------- 9:17 AM on 05/05/2016 -----------------------------------------    I have reviewed the triage vital signs and the nursing notes.   HISTORY  Chief Complaint Fever and Generalized Body Aches    HPI Randy FloodRichard L Min Jr. is a 35 y.o. male who presents to the ER for generalized body aches and fever since last night. Patient states he is complaining of painon the left side of his neck and then going down his back. 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 Patient is afraid of infection secondary to same. Patient did use recently, was concerned about an abscess on his left wrist. He's had chills but denies cough, vomiting or diarrhea.   Past Medical History:  Diagnosis Date  . Anxiety   . Bronchitis   . Cocaine abuse   . Depression   . Drug abuse   . Heroin abuse     Patient Active Problem List   Diagnosis Date Noted  . Severe recurrent major depression without psychotic features (HCC) 05/22/2015  . Opiate abuse, continuous 05/22/2015  . Cocaine abuse 05/22/2015    Past Surgical History:  Procedure Laterality Date  . NOSE SURGERY    . TYMPANOPLASTY      Allergies Bactrim [sulfamethoxazole-trimethoprim] and Sulfa antibiotics  Social History Social History  Substance Use Topics  . Smoking status: Current Every Day Smoker  . Smokeless tobacco: Not on file  . Alcohol use Yes    Review of Systems Constitutional: Positive for fevers, chills, body aches Cardiovascular: Negative for chest pain. Respiratory: Negative for shortness of breath. Positive for occasional cough Gastrointestinal: Negative for abdominal pain, vomiting and diarrhea. Genitourinary: Negative for dysuria. Musculoskeletal: Negative for back pain. Skin: Positive for possible left wrist abscess Neurological: Negative for headaches, focal  weakness or numbness.  10-point ROS otherwise negative.  ____________________________________________   PHYSICAL EXAM:  VITAL SIGNS: ED Triage Vitals  Enc Vitals Group     BP 05/05/16 0914 139/89     Pulse Rate 05/05/16 0914 (!) 110     Resp 05/05/16 0914 20     Temp 05/05/16 0914 (!) 101.1 F (38.4 C)     Temp Source 05/05/16 0914 Oral     SpO2 05/05/16 0914 95 %     Weight 05/05/16 0915 290 lb (131.5 kg)     Height 05/05/16 0915 5\' 9"  (1.753 m)     Head Circumference --      Peak Flow --      Pain Score 05/05/16 0915 5     Pain Loc --      Pain Edu? --      Excl. in GC? --    Constitutional: Alert and oriented. Well appearing and in no distress. Eyes: Conjunctivae are normal. PERRL. Normal extraocular movements. ENT   Head: Normocephalic and atraumatic.   Nose: No congestion/rhinnorhea.   Mouth/Throat: Mucous membranes are moist.   Neck: No stridor. Cardiovascular: Rapid rate, regular rhythm. No murmurs, rubs, or gallops. Respiratory: Normal respiratory effort without tachypnea nor retractions. Breath sounds are clear and equal bilaterally. No wheezes/rales/rhonchi. Gastrointestinal: Soft and nontender. Normal bowel sounds Musculoskeletal: Nontender with normal range of motion in all extremities. No lower extremity tenderness nor edema. Neurologic:  Normal speech and language. No gross focal neurologic deficits are appreciated.  Skin:  Skin is warm, dry and intact. Tract marks are evident, there is an area of  remote, healed abscess on his left wrist. Psychiatric: Mood and affect are normal. Speech and behavior are normal.  ____________________________________________  EKG: Interpreted by me. Sinus tachycardia with a rate of 104 bpm, normal PR interval, normal QRS, normal QT interval. Normal axis.  ____________________________________________  ED COURSE:  Pertinent labs & imaging results that were available during my care of the patient were reviewed by  me and considered in my medical decision making (see chart for details). Clinical Course  Patient resents to ER with fever and generalized complaints. We'll check basic labs and imaging.  Procedures ____________________________________________   LABS (pertinent positives/negatives)  Labs Reviewed  COMPREHENSIVE METABOLIC PANEL - Abnormal; Notable for the following:       Result Value   Sodium 131 (*)    Chloride 99 (*)    Glucose, Bld 127 (*)    Calcium 8.7 (*)    All other components within normal limits  CBC WITH DIFFERENTIAL/PLATELET - Abnormal; Notable for the following:    WBC 13.1 (*)    Platelets 148 (*)    Neutro Abs 10.0 (*)    Monocytes Absolute 1.2 (*)    All other components within normal limits  BLOOD GAS, VENOUS - Abnormal; Notable for the following:    pH, Ven 7.46 (*)    pCO2, Ven 38 (*)    pO2, Ven 64.0 (*)    Acid-Base Excess 3.1 (*)    All other components within normal limits  URINALYSIS COMPLETEWITH MICROSCOPIC ([facility] ONLY) - Abnormal; Notable for the following:    Color, Urine YELLOW (*)    APPearance CLEAR (*)    Hgb urine dipstick 2+ (*)    All other components within normal limits  CULTURE, BLOOD (ROUTINE X 2)  CULTURE, BLOOD (ROUTINE X 2)  LACTIC ACID, PLASMA  TROPONIN I  SEDIMENTATION RATE  URINE DRUG SCREEN, QUALITATIVE ([facility] ONLY)    RADIOLOGY  Chest x-ray Is unremarkable ____________________________________________  FINAL ASSESSMENT AND PLAN  Fever, IV drug abuse, systemic inflammatory response syndrome  Plan: Patient with labs and imaging as dictated above. Patient presents with SIRS criteria, concerning because he is an IV drug abuser. I will discuss with the hospitalist for admission, we started presumptive treatment to cover for infective endocarditis. Patient states currently he is feeling better.  After discussion with the hospitalist, the recommendation would be to check an echocardiogram, at this is negative to discharge him  with Septra DS. Study Conclusions  - Procedure narrative: Transthoracic echocardiography. The study   was technically difficult. - Left ventricle: The cavity size was normal. Wall thickness was   increased in a pattern of mild LVH. Systolic function was normal.   The estimated ejection fraction was 65%. Wall motion was normal;   there were no regional wall motion abnormalities. Doppler   parameters are consistent with abnormal left ventricular   relaxation (grade 1 diastolic dysfunction). - Atrial septum: No defect or patent foramen ovale was identified.  Impressions:  - There was no evidence of a vegetation.   Patient be discharged on clindamycin as he is allergic to sulfa medications, I have advised for him to return for worsening or worrisome symptoms. He needs close outpatient follow-up. Emily FilbertWilliams, Jonathan E, MD   Note: This dictation was prepared with Dragon dictation. Any transcriptional errors that result from this process are unintentional    Emily FilbertJonathan E Williams, MD 05/05/16 1132    Emily FilbertJonathan E Williams, MD 05/05/16 1351    Emily FilbertJonathan E Williams, MD 05/05/16 [phone]

## 2016-05-05 NOTE — ED Notes (Signed)
Echo tech has completed bedside echo

## 2016-05-05 NOTE — Progress Notes (Signed)
*  PRELIMINARY RESULTS* Echocardiogram 2D Echocardiogram has been performed.  Cristela BlueHege, Pluma Diniz 05/05/2016, 1:24 PM

## 2016-05-05 NOTE — ED Notes (Signed)
Pt aware of need for urine specimen. 

## 2016-05-06 ENCOUNTER — Emergency Department: Payer: Medicaid Other

## 2016-05-06 ENCOUNTER — Encounter: Payer: Self-pay | Admitting: Emergency Medicine

## 2016-05-06 ENCOUNTER — Inpatient Hospital Stay
Admission: EM | Admit: 2016-05-06 | Discharge: 2016-05-21 | DRG: 872 | Disposition: A | Discharge status: Home / Self Care | Payer: Medicaid Other | Attending: Internal Medicine | Admitting: Internal Medicine

## 2016-05-06 ENCOUNTER — Inpatient Hospital Stay: Payer: Medicaid Other

## 2016-05-06 ENCOUNTER — Telehealth: Payer: Self-pay | Admitting: Infectious Diseases

## 2016-05-06 DIAGNOSIS — Z716 Tobacco abuse counseling: Secondary | ICD-10-CM | POA: Diagnosis not present

## 2016-05-06 DIAGNOSIS — Z6841 Body Mass Index (BMI) 40.0 and over, adult: Secondary | ICD-10-CM | POA: Diagnosis not present

## 2016-05-06 DIAGNOSIS — Z841 Family history of disorders of kidney and ureter: Secondary | ICD-10-CM | POA: Diagnosis not present

## 2016-05-06 DIAGNOSIS — E669 Obesity, unspecified: Secondary | ICD-10-CM | POA: Diagnosis present

## 2016-05-06 DIAGNOSIS — M79602 Pain in left arm: Secondary | ICD-10-CM | POA: Diagnosis present

## 2016-05-06 DIAGNOSIS — R000 Tachycardia, unspecified: Secondary | ICD-10-CM | POA: Diagnosis present

## 2016-05-06 DIAGNOSIS — Z9889 Other specified postprocedural states: Secondary | ICD-10-CM

## 2016-05-06 DIAGNOSIS — A412 Sepsis due to unspecified staphylococcus: Secondary | ICD-10-CM | POA: Diagnosis present

## 2016-05-06 DIAGNOSIS — Z882 Allergy status to sulfonamides status: Secondary | ICD-10-CM | POA: Diagnosis not present

## 2016-05-06 DIAGNOSIS — R7881 Bacteremia: Secondary | ICD-10-CM

## 2016-05-06 DIAGNOSIS — Z833 Family history of diabetes mellitus: Secondary | ICD-10-CM | POA: Diagnosis not present

## 2016-05-06 DIAGNOSIS — B954 Other streptococcus as the cause of diseases classified elsewhere: Secondary | ICD-10-CM | POA: Diagnosis present

## 2016-05-06 DIAGNOSIS — Z79899 Other long term (current) drug therapy: Secondary | ICD-10-CM

## 2016-05-06 DIAGNOSIS — F17220 Nicotine dependence, chewing tobacco, uncomplicated: Secondary | ICD-10-CM | POA: Diagnosis present

## 2016-05-06 DIAGNOSIS — F3160 Bipolar disorder, current episode mixed, unspecified: Secondary | ICD-10-CM | POA: Diagnosis present

## 2016-05-06 DIAGNOSIS — F1410 Cocaine abuse, uncomplicated: Secondary | ICD-10-CM | POA: Diagnosis present

## 2016-05-06 DIAGNOSIS — F1120 Opioid dependence, uncomplicated: Secondary | ICD-10-CM | POA: Diagnosis present

## 2016-05-06 DIAGNOSIS — A4101 Sepsis due to Methicillin susceptible Staphylococcus aureus: Principal | ICD-10-CM | POA: Diagnosis present

## 2016-05-06 DIAGNOSIS — Z8249 Family history of ischemic heart disease and other diseases of the circulatory system: Secondary | ICD-10-CM | POA: Diagnosis not present

## 2016-05-06 DIAGNOSIS — F3162 Bipolar disorder, current episode mixed, moderate: Secondary | ICD-10-CM

## 2016-05-06 DIAGNOSIS — Z915 Personal history of self-harm: Secondary | ICD-10-CM | POA: Diagnosis not present

## 2016-05-06 DIAGNOSIS — G8929 Other chronic pain: Secondary | ICD-10-CM | POA: Diagnosis present

## 2016-05-06 DIAGNOSIS — F1110 Opioid abuse, uncomplicated: Secondary | ICD-10-CM | POA: Diagnosis present

## 2016-05-06 LAB — URINALYSIS COMPLETE WITH MICROSCOPIC (ARMC ONLY)
Bacteria, UA: NONE SEEN
Bilirubin Urine: NEGATIVE
Glucose, UA: NEGATIVE mg/dL
Ketones, ur: NEGATIVE mg/dL
Leukocytes, UA: NEGATIVE
Nitrite: NEGATIVE
Protein, ur: NEGATIVE mg/dL
Specific Gravity, Urine: 1.019 (ref 1.005–1.030)
pH: 6 (ref 5.0–8.0)

## 2016-05-06 LAB — BLOOD CULTURE ID PANEL (REFLEXED)
Acinetobacter baumannii: NOT DETECTED
Candida albicans: NOT DETECTED
Candida glabrata: NOT DETECTED
Candida krusei: NOT DETECTED
Candida parapsilosis: NOT DETECTED
Candida tropicalis: NOT DETECTED
Carbapenem resistance: NOT DETECTED
Enterobacter cloacae complex: NOT DETECTED
Enterobacteriaceae species: NOT DETECTED
Enterococcus species: NOT DETECTED
Escherichia coli: NOT DETECTED
Haemophilus influenzae: NOT DETECTED
Klebsiella oxytoca: NOT DETECTED
Klebsiella pneumoniae: NOT DETECTED
Listeria monocytogenes: NOT DETECTED
Methicillin resistance: NOT DETECTED
Neisseria meningitidis: NOT DETECTED
Proteus species: NOT DETECTED
Pseudomonas aeruginosa: NOT DETECTED
Serratia marcescens: NOT DETECTED
Staphylococcus aureus (BCID): DETECTED — AB
Staphylococcus species: DETECTED — AB
Streptococcus agalactiae: NOT DETECTED
Streptococcus pneumoniae: NOT DETECTED
Streptococcus pyogenes: NOT DETECTED
Streptococcus species: NOT DETECTED
Vancomycin resistance: NOT DETECTED

## 2016-05-06 LAB — CBC WITH DIFFERENTIAL/PLATELET
Basophils Absolute: 0 10*3/uL (ref 0–0.1)
Basophils Relative: 0 %
Eosinophils Absolute: 0.1 10*3/uL (ref 0–0.7)
Eosinophils Relative: 2 %
HCT: 42.4 % (ref 40.0–52.0)
Hemoglobin: 14.8 g/dL (ref 13.0–18.0)
Lymphocytes Relative: 22 %
Lymphs Abs: 2 10*3/uL (ref 1.0–3.6)
MCH: 30.9 pg (ref 26.0–34.0)
MCHC: 35 g/dL (ref 32.0–36.0)
MCV: 88.3 fL (ref 80.0–100.0)
Monocytes Absolute: 0.8 10*3/uL (ref 0.2–1.0)
Monocytes Relative: 9 %
Neutro Abs: 5.9 10*3/uL (ref 1.4–6.5)
Neutrophils Relative %: 67 %
Platelets: 118 10*3/uL — ABNORMAL LOW (ref 150–440)
RBC: 4.8 MIL/uL (ref 4.40–5.90)
RDW: 12.6 % (ref 11.5–14.5)
WBC: 8.8 10*3/uL (ref 3.8–10.6)

## 2016-05-06 LAB — COMPREHENSIVE METABOLIC PANEL
ALT: 44 U/L (ref 17–63)
AST: 38 U/L (ref 15–41)
Albumin: 3.5 g/dL (ref 3.5–5.0)
Alkaline Phosphatase: 42 U/L (ref 38–126)
Anion gap: 7 (ref 5–15)
BUN: 11 mg/dL (ref 6–20)
CO2: 24 mmol/L (ref 22–32)
Calcium: 8.3 mg/dL — ABNORMAL LOW (ref 8.9–10.3)
Chloride: 105 mmol/L (ref 101–111)
Creatinine, Ser: 0.66 mg/dL (ref 0.61–1.24)
GFR calc Af Amer: >60 mL/min (ref >60)
GFR calc non Af Amer: >60 mL/min (ref >60)
Glucose, Bld: 120 mg/dL — ABNORMAL HIGH (ref 65–99)
Potassium: 4.3 mmol/L (ref 3.5–5.1)
Sodium: 136 mmol/L (ref 135–145)
Total Bilirubin: 0.7 mg/dL (ref 0.3–1.2)
Total Protein: 6.8 g/dL (ref 6.5–8.1)

## 2016-05-06 LAB — LACTIC ACID, PLASMA: Lactic Acid, Venous: 1.5 mmol/L (ref 0.5–1.9)

## 2016-05-06 MED ORDER — SODIUM CHLORIDE 0.9 % IV SOLN
INTRAVENOUS | Status: DC
  Administered 2016-05-06 – 2016-05-08 (×3): via INTRAVENOUS

## 2016-05-06 MED ORDER — VANCOMYCIN HCL IN DEXTROSE 1-5 GM/200ML-% IV SOLN
1000.0000 mg | Freq: Once | INTRAVENOUS | Status: AC
  Administered 2016-05-06: 1000 mg via INTRAVENOUS
  Filled 2016-05-06: qty 200

## 2016-05-06 MED ORDER — PIPERACILLIN-TAZOBACTAM 3.375 G IVPB 30 MIN
3.3750 g | Freq: Once | INTRAVENOUS | Status: AC
  Administered 2016-05-06: 3.375 g via INTRAVENOUS
  Filled 2016-05-06: qty 50

## 2016-05-06 MED ORDER — ONDANSETRON HCL 4 MG/2ML IJ SOLN: 4.0000 mg | Freq: Four times a day (QID) | INTRAMUSCULAR | Status: DC | PRN

## 2016-05-06 MED ORDER — PIPERACILLIN-TAZOBACTAM 3.375 G IVPB
3.3750 g | Freq: Three times a day (TID) | INTRAVENOUS | Status: DC
  Filled 2016-05-06: qty 50

## 2016-05-06 MED ORDER — SODIUM CHLORIDE 0.9 % IV BOLUS (SEPSIS)
1000.0000 mL | Freq: Once | INTRAVENOUS | Status: AC
  Administered 2016-05-06: 1000 mL via INTRAVENOUS

## 2016-05-06 MED ORDER — NICOTINE 21 MG/24HR TD PT24
21.0000 mg | MEDICATED_PATCH | Freq: Every day | TRANSDERMAL | Status: DC
  Administered 2016-05-06 – 2016-05-21 (×16): 21 mg via TRANSDERMAL
  Filled 2016-05-06 (×16): qty 1

## 2016-05-06 MED ORDER — TRAMADOL HCL 50 MG PO TABS
50.0000 mg | ORAL_TABLET | Freq: Four times a day (QID) | ORAL | Status: DC | PRN
  Administered 2016-05-11 – 2016-05-21 (×22): 50 mg via ORAL
  Filled 2016-05-06 (×22): qty 1

## 2016-05-06 MED ORDER — BUPRENORPHINE HCL 2 MG SL SUBL
8.0000 mg | SUBLINGUAL_TABLET | Freq: Every day | SUBLINGUAL | Status: DC
  Administered 2016-05-07: 8 mg via SUBLINGUAL
  Filled 2016-05-06: qty 4

## 2016-05-06 MED ORDER — LINEZOLID 600 MG PO TABS
600.0000 mg | ORAL_TABLET | Freq: Once | ORAL | Status: DC
Start: 2016-05-06 — End: 2016-05-07
  Filled 2016-05-06: qty 1

## 2016-05-06 MED ORDER — ONDANSETRON HCL 4 MG PO TABS: 4.0000 mg | ORAL_TABLET | Freq: Four times a day (QID) | ORAL | Status: DC | PRN

## 2016-05-06 MED ORDER — VANCOMYCIN HCL 10 G IV SOLR
1500.0000 mg | Freq: Three times a day (TID) | INTRAVENOUS | Status: DC
  Administered 2016-05-06 – 2016-05-07 (×3): 1500 mg via INTRAVENOUS
  Filled 2016-05-06 (×7): qty 1500

## 2016-05-06 MED ORDER — ACETAMINOPHEN 325 MG PO TABS
650.0000 mg | ORAL_TABLET | Freq: Four times a day (QID) | ORAL | Status: DC | PRN
  Administered 2016-05-06 – 2016-05-07 (×3): 650 mg via ORAL
  Filled 2016-05-06 (×3): qty 2

## 2016-05-06 MED ORDER — ENOXAPARIN SODIUM 40 MG/0.4ML ~~LOC~~ SOLN
40.0000 mg | Freq: Two times a day (BID) | SUBCUTANEOUS | Status: DC
  Administered 2016-05-06 – 2016-05-20 (×21): 40 mg via SUBCUTANEOUS
  Filled 2016-05-06 (×26): qty 0.4

## 2016-05-06 NOTE — ED Notes (Signed)
picc nurse at bedside for insertion

## 2016-05-06 NOTE — ED Notes (Signed)
Lab called with positive blood culture 05/06/16 0100, Dr. Manson PasseyBrown made aware and instructed to contact patient and return for further evaluation and IV antibiotics. Attempted to contact patient with numbers provided on chart without success. Will leave information with Viviano SimasLiz Gannon RN to follow up with patient.

## 2016-05-06 NOTE — Progress Notes (Signed)
PHARMACY - PHYSICIAN COMMUNICATION CRITICAL VALUE ALERT - BLOOD CULTURE IDENTIFICATION (BCID)  No results found for this or any previous visit.   [employer] returned Staph aureus mecA(-)  Name of physician (or Provider) Contacted: ED charge RN  Changes to prescribed antibiotics required: pt seen in ED and discharged.  Juleen Sorrels S 05/06/2016  12:21 AM

## 2016-05-06 NOTE — ED Notes (Signed)
Called code sepsis

## 2016-05-06 NOTE — ED Notes (Signed)
Ignore previous documentation. Entered in error.

## 2016-05-06 NOTE — Progress Notes (Signed)
PHARMACY - PHYSICIAN COMMUNICATION CRITICAL VALUE ALERT - BLOOD CULTURE IDENTIFICATION (BCID)  Results for orders placed or performed during the hospital encounter of 05/05/16  Blood Culture ID Panel (Reflexed) (Collected: 05/05/2016  9:23 AM)  Result Value Ref Range   Enterococcus species NOT DETECTED NOT DETECTED   Vancomycin resistance NOT DETECTED NOT DETECTED   Listeria monocytogenes NOT DETECTED NOT DETECTED   Staphylococcus species DETECTED (A) NOT DETECTED   Staphylococcus aureus DETECTED (A) NOT DETECTED   Methicillin resistance NOT DETECTED NOT DETECTED   Streptococcus species NOT DETECTED NOT DETECTED   Streptococcus agalactiae NOT DETECTED NOT DETECTED   Streptococcus pneumoniae NOT DETECTED NOT DETECTED   Streptococcus pyogenes NOT DETECTED NOT DETECTED   Acinetobacter baumannii NOT DETECTED NOT DETECTED   Enterobacteriaceae species NOT DETECTED NOT DETECTED   Enterobacter cloacae complex NOT DETECTED NOT DETECTED   Escherichia coli NOT DETECTED NOT DETECTED   Klebsiella oxytoca NOT DETECTED NOT DETECTED   Klebsiella pneumoniae NOT DETECTED NOT DETECTED   Proteus species NOT DETECTED NOT DETECTED   Serratia marcescens NOT DETECTED NOT DETECTED   Carbapenem resistance NOT DETECTED NOT DETECTED   Haemophilus influenzae NOT DETECTED NOT DETECTED   Neisseria meningitidis NOT DETECTED NOT DETECTED   Pseudomonas aeruginosa NOT DETECTED NOT DETECTED   Candida albicans NOT DETECTED NOT DETECTED   Candida glabrata NOT DETECTED NOT DETECTED   Candida krusei NOT DETECTED NOT DETECTED   Candida parapsilosis NOT DETECTED NOT DETECTED   Candida tropicalis NOT DETECTED NOT DETECTED   Contacted patient: called patient and told the patient to come back to the hospital to be treated for his blood infection. Patient is aware of this and said he would come to the hospital within the next hour to be treated.   Name of physician (or Provider) Contacted: Notified MD Inocente SallesKevin Paduchowski     Kelly m PlainviewFuhrmann 05/06/2016  2:03 PM

## 2016-05-06 NOTE — ED Triage Notes (Signed)
Pt was seen yesterday and sent home after blood cultures were taken. Was called today and told to come back because his blood cultures showed a bloodstream infection. Pt alert & and oriented with NAD noted.

## 2016-05-06 NOTE — ED Notes (Signed)
Pt seen yesterday for fever. Blood cultures came back positive for bacterial infection. Pt is an iv drug user that shares needles with his girlfriend. Girlfriend is also feeling ill and has checked in for eval. Unable to establish iv d/t pt's drug abuse and bad veins. Dr. Bevely PalmerMade aware and ordered a picc line and picc nurse notifed by charge nurse. She is scheduled to be here around 8pm.

## 2016-05-06 NOTE — ED Notes (Signed)
Unable to print off labels for blood drawn at 1725 with one set of blood cultures.

## 2016-05-06 NOTE — H&P (Addendum)
[facility] [place] [facility]   PATIENT NAME: Randy Woods    MR#:  [id number]  DATE OF BIRTH:  02-Apr-1981  DATE OF ADMISSION:  05/06/2016  PRIMARY CARE PHYSICIAN: [facility]   REQUESTING/REFERRING PHYSICIAN: dr  Don Perkingveronese  CHIEF COMPLAINT:   Positive blood cultures 2 HISTORY OF PRESENT ILLNESS:  Randy Woods  is a 35 y.o. male with a known history of IV drug abuse presents to the emergency room for positive blood cultures. Patient was seen here yesterday complaining of generalized body aches and low-grade fever. He received some IV fluids received IV clindamycin and patient started feeling better his vital signs were stable echo was done and no medications were noted and patient was discharged to complete a course with clindamycin. His blood cultures turned out +2 out of 2 and came to the emergency room. Bio fire positive for staph aureus species Patient has no IV access despite a few tries in the emergency room. He is to get a PICC line placed. I spoke with Dr. Sampson GoonFitzgerald he recommends giving 1 dose of IV by mouth linezolid and then get started on IV Vanco and Zosyn and once PICC line is placed. Patient has been shooting IV heroine and last use was 2 days ago. He's been sharing needles with his girlfriend also. His urine drug screen is positive for benzos and barbiturates and cocaine  PAST MEDICAL HISTORY:   Past Medical History:  Diagnosis Date  . Anxiety   . Bronchitis   . Cocaine abuse   . Depression   . Drug abuse   . Heroin abuse     PAST SURGICAL HISTOIRY:   Past Surgical History:  Procedure Laterality Date  . NOSE SURGERY    . TYMPANOPLASTY      SOCIAL HISTORY:   Social History  Substance Use Topics  . Smoking status: Current Every Day Smoker  . Smokeless tobacco: Current User    Types: Snuff  . Alcohol use Yes    FAMILY HISTORY:   Family History  Problem Relation Age of Onset  . Diabetes Mother   .  Hypertension Father   . Kidney failure Father   . Diabetes Father     DRUG ALLERGIES:   Allergies  Allergen Reactions  . Bactrim [Sulfamethoxazole-Trimethoprim]   . Sulfa Antibiotics     REVIEW OF SYSTEMS:  Review of Systems  Constitutional: Negative for chills, fever and weight loss.  HENT: Negative for ear discharge, ear pain and nosebleeds.   Eyes: Negative for blurred vision, pain and discharge.  Respiratory: Negative for sputum production, shortness of breath, wheezing and stridor.   Cardiovascular: Negative for chest pain, palpitations, orthopnea and PND.  Gastrointestinal: Negative for abdominal pain, diarrhea, nausea and vomiting.  Genitourinary: Negative for frequency and urgency.  Musculoskeletal: Negative for back pain and joint pain.  Neurological: Positive for weakness. Negative for sensory change, speech change and focal weakness.  Psychiatric/Behavioral: Negative for depression and hallucinations. The patient is not nervous/anxious.      MEDICATIONS AT HOME:   Prior to Admission medications   Medication Sig Start Date End Date Taking? Authorizing Provider  buprenorphine-naloxone (SUBOXONE) 8-2 MG SUBL SL tablet Place 1 tablet under the tongue 2 (two) times daily.   Yes Historical Provider, MD  ibuprofen (ADVIL,MOTRIN) 200 MG tablet Take 600 mg by mouth 2 (two) times daily.   Yes Historical Provider, MD  amoxicillin (AMOXIL) 500 MG capsule Take 1 capsule (500 mg total) by mouth 3 (  three) times daily. Patient not taking: Reported on 05/06/2016 01/27/16   Charlesetta Ivory Menshew, PA-C  clindamycin (CLEOCIN) 300 MG capsule Take 1 capsule (300 mg total) by mouth 3 (three) times daily. Patient not taking: Reported on 05/06/2016 05/05/16   Emily Filbert, MD  predniSONE (DELTASONE) 10 MG tablet Take 2 tablets (20 mg total) by mouth daily with breakfast. Patient not taking: Reported on 05/06/2016 01/27/16   Charlesetta Ivory Menshew, PA-C  QUEtiapine (SEROQUEL) 100 MG tablet  Take 1 tablet (100 mg total) by mouth at bedtime. Patient not taking: Reported on 05/06/2016 05/23/15   Audery Amel, MD  ziprasidone (GEODON) 40 MG capsule Take 1 capsule (40 mg total) by mouth 2 (two) times daily with a meal. Patient not taking: Reported on 05/06/2016 05/23/15   Audery Amel, MD      VITAL SIGNS:  Blood pressure 130/76, pulse 75, temperature 98.2 F (36.8 C), temperature source Oral, resp. rate 15, height 5\' 9"  (1.753 m), weight 300 lb (136.1 kg), SpO2 96 %.  PHYSICAL EXAMINATION:  GENERAL:  35 y.o.-year-old patient lying in the bed with no acute distress.  EYES: Pupils equal, round, reactive to light and accommodation. No scleral icterus. Extraocular muscles intact.  HEENT: Head atraumatic, normocephalic. Oropharynx and nasopharynx clear.  NECK:  Supple, no jugular venous distention. No thyroid enlargement, no tenderness.  LUNGS: Normal breath sounds bilaterally, no wheezing, rales,rhonchi or crepitation. No use of accessory muscles of respiration.  CARDIOVASCULAR: S1, S2 normal. No murmurs, rubs, or gallops.  ABDOMEN: Soft, nontender, nondistended. Bowel sounds present. No organomegaly or mass.  EXTREMITIES: No pedal edema, cyanosis, or clubbing. Track marks positive and has left wrist no cellulitis noted NEUROLOGIC: Cranial nerves II through XII are intact. Muscle strength 5/5 in all extremities. Sensation intact. Gait not checked.  PSYCHIATRIC: The patient is alert and oriented x 3.  SKIN: No obvious rash, lesion, or ulcer.   LABORATORY PANEL:   CBC  Recent Labs Lab 05/06/16 1724  WBC 8.8  HGB 14.8  HCT 42.4  PLT 118*   ------------------------------------------------------------------------------------------------------------------  Chemistries   Recent Labs Lab 05/06/16 1724  NA 136  K 4.3  CL 105  CO2 24  GLUCOSE 120*  BUN 11  CREATININE 0.66  CALCIUM 8.3*  AST 38  ALT 44  ALKPHOS 42  BILITOT 0.7    ------------------------------------------------------------------------------------------------------------------  Cardiac Enzymes  Recent Labs Lab 05/05/16 0923  TROPONINI <0.03   ------------------------------------------------------------------------------------------------------------------  RADIOLOGY:  Dg Chest 2 View  Result Date: 05/06/2016 CLINICAL DATA:  Sepsis, fever for 3 days EXAM: CHEST  2 VIEW COMPARISON:  05/05/2016 FINDINGS: The heart size and mediastinal contours are within normal limits. Both lungs are clear. The visualized skeletal structures are unremarkable. IMPRESSION: No active cardiopulmonary disease. Electronically Signed   By: [name] M.D.   On: 05/06/2016 16:44   Dg Chest Port 1 View  Result Date: 05/05/2016 CLINICAL DATA:  Fever with chest pain and shortness of breath EXAM: PORTABLE CHEST 1 VIEW COMPARISON:  May 31, 2015 FINDINGS: Lungs are clear. The heart size and pulmonary vascularity are normal. No adenopathy. No bone lesions. IMPRESSION: No edema or consolidation. Electronically Signed   By: [name] M.D.   On: 05/05/2016 10:06    EKG:    IMPRESSION AND PLAN:   Randy Woods  is a 35 y.o. male with a known history of IV drug abuse presents to the emergency room for positive blood cultures.   1. Sepsis Staphylococcus  patient with IV drug abuse -Admit to medical floor -IV Vanco and Zosyn -ID consultation. Case.discussed with Dr. [name] Patient had echo done shows essentially negative for vegetations. -He will need TEE -PICC line to be placed for IV antibiotics. -Patient with get 1 dose of by mouth linezolid before PICC line is placed. Follow-up blood cultures thereafter  2. IV drug abuse -Last drug use 2 days ago patient injected couple bags of heroin - We'll do psych consultation for  severe polysubstance abuse  3. DVT prophylaxis subcutaneous Lovenox  4. Tobacco abuse Smoking cessation counselling done for 4  mins  All the records are reviewed and case discussed with ED provider. Management plans discussed with the patient, family and they are in agreement.  CODE STATUS: Full  TOTAL TIME TAKING CARE OF THIS PATIENT:50 minutes.    Adlee Paar M.D on 05/06/2016 at 7:09 PM  Between 7am to 6pm - Pager - [phone]  After 6pm go to [email or web address] - password [email or web address]  [facility]  Office  [phone]  CC: Primary care physician; [name]

## 2016-05-06 NOTE — ED Notes (Signed)
Attempted to draw 2nd blood culture set. Line will not pull back. 2nd antibiotic started.

## 2016-05-06 NOTE — Telephone Encounter (Signed)
Biofire reviewed, echo reviewed.  IVDU with recent hand cellulitis now with fevers and Staph aureus bacteremia.   Needs admission, repeat BCX, to start vancomycin and check TEE.  WIll need PICC and minimum 2 weeks IV vanco.  PICC can be placed once BCX neg x 48 hours

## 2016-05-06 NOTE — ED Provider Notes (Signed)
[facility] Emergency Department Provider Note  ____________________________________________  Time seen: Approximately 4:14 PM  I have reviewed the triage vital signs and the nursing notes.   HISTORY  Chief Complaint Abnormal Lab   HPI Randy FloodRichard L Innocent Jr. is a 35 y.o. male the history of IV drug use who presents for positive blood cultures. Patient was seen here yesterday complaining of generalized body aches and fever. Patient was found to have SIRS criteria with a fever of 101F, tachycardic to 110, WBC 13.1. He was started on clindamycin and the hospitalist service was consulted for admission. Hospitalist service recommended checking ECHO and if negative for vegetations, to DC patient home. ECHO was negative and patient was dc on clindamycin. Blood cultures today were positive and patient was called back. Patient last used IV drugs 2 days ago. He endorses subjective fever and bilateral flank pain for the last few days. Pain is moderate, constant, throbbing in nature. He denies HA, neck stiffness, back pain, saddle anesthesia, weakness or numbness of his extremities, urinary or bowel, some retention, dysuria, cough, chest pain, shortness of breath. He has been taking the clindamycin since yesterday.   Past Medical History:  Diagnosis Date  . Anxiety   . Bronchitis   . Cocaine abuse   . Depression   . Drug abuse   . Heroin abuse     Patient Active Problem List   Diagnosis Date Noted  . Severe recurrent major depression without psychotic features (HCC) 05/22/2015  . Opiate abuse, continuous 05/22/2015  . Cocaine abuse 05/22/2015    Past Surgical History:  Procedure Laterality Date  . NOSE SURGERY    . TYMPANOPLASTY      Prior to Admission medications   Medication Sig Start Date End Date Taking? Authorizing Provider  amoxicillin (AMOXIL) 500 MG capsule Take 1 capsule (500 mg total) by mouth 3 (three) times daily. 01/27/16   Jenise V Bacon Menshew, PA-C   buprenorphine-naloxone (SUBOXONE) 8-2 MG SUBL SL tablet Place 1 tablet under the tongue 2 (two) times daily.    Historical Provider, MD  clindamycin (CLEOCIN) 300 MG capsule Take 1 capsule (300 mg total) by mouth 3 (three) times daily. 05/05/16   Emily FilbertJonathan E Williams, MD  predniSONE (DELTASONE) 10 MG tablet Take 2 tablets (20 mg total) by mouth daily with breakfast. 01/27/16   Charlesetta IvoryJenise V Bacon Menshew, PA-C  QUEtiapine (SEROQUEL) 100 MG tablet Take 1 tablet (100 mg total) by mouth at bedtime. 05/23/15   Audery AmelJohn T Clapacs, MD  ziprasidone (GEODON) 40 MG capsule Take 1 capsule (40 mg total) by mouth 2 (two) times daily with a meal. 05/23/15   Audery AmelJohn T Clapacs, MD    Allergies Bactrim [sulfamethoxazole-trimethoprim] and Sulfa antibiotics  Family History  Problem Relation Age of Onset  . Diabetes Mother   . Hypertension Father   . Kidney failure Father   . Diabetes Father     Social History Social History  Substance Use Topics  . Smoking status: Current Every Day Smoker  . Smokeless tobacco: Current User    Types: Snuff  . Alcohol use Yes    Review of Systems  Constitutional: + fever. Eyes: Negative for visual changes. ENT: Negative for sore throat. Cardiovascular: Negative for chest pain. Respiratory: Negative for shortness of breath. Gastrointestinal: Negative for abdominal pain, vomiting or diarrhea. Genitourinary: Negative for dysuria. + bilateral flank pain Musculoskeletal: Negative for back pain. Skin: Negative for rash. Neurological: Negative for headaches, weakness or numbness.  ____________________________________________  PHYSICAL EXAM:  VITAL SIGNS: ED Triage Vitals  Enc Vitals Group     BP 05/06/16 1559 (!) 125/57     Pulse Rate 05/06/16 1559 83     Resp 05/06/16 1559 20     Temp 05/06/16 1559 98.2 F (36.8 C)     Temp Source 05/06/16 1559 Oral     SpO2 --      Weight 05/06/16 1601 300 lb (136.1 kg)     Height 05/06/16 1601 5\' 9"  (1.753 m)     Head Circumference  --      Peak Flow --      Pain Score 05/06/16 1601 6     Pain Loc --      Pain Edu? --      Excl. in GC? --     Constitutional: Alert and oriented. Well appearing and in no apparent distress. HEENT:      Head: Normocephalic and atraumatic.         Eyes: Conjunctivae are normal. Sclera is non-icteric. EOMI. PERRL      Mouth/Throat: Mucous membranes are moist.       Neck: Supple with no signs of meningismus. Cardiovascular: Regular rate and rhythm. No murmurs, gallops, or rubs. 2+ symmetrical distal pulses are present in all extremities. No JVD. Respiratory: Normal respiratory effort. Lungs are clear to auscultation bilaterally. No wheezes, crackles, or rhonchi.  Gastrointestinal: Soft, non tender, and non distended with positive bowel sounds. No rebound or guarding. Genitourinary: No CVA tenderness. Musculoskeletal: Nontender with normal range of motion in all extremities. No edema, cyanosis, or erythema of extremities. Neurologic: Normal speech and language. Face is symmetric. Moving all extremities. No gross focal neurologic deficits are appreciated. Skin: Splinter hemorrhages on bilateral hands. Skin is warm, dry and intact. No rash noted. Psychiatric: Mood and affect are normal. Speech and behavior are normal.  ____________________________________________   LABS (all labs ordered are listed, but only abnormal results are displayed)  Labs Reviewed  COMPREHENSIVE METABOLIC PANEL - Abnormal; Notable for the following:       Result Value   Glucose, Bld 120 (*)    Calcium 8.3 (*)    All other components within normal limits  CBC WITH DIFFERENTIAL/PLATELET - Abnormal; Notable for the following:    Platelets 118 (*)    All other components within normal limits  CULTURE, BLOOD (ROUTINE X 2)  CULTURE, BLOOD (ROUTINE X 2)  URINE CULTURE  LACTIC ACID, PLASMA  LACTIC ACID, PLASMA  URINALYSIS COMPLETEWITH MICROSCOPIC ([facility] ONLY)    ____________________________________________  EKG  ED ECG REPORT I, Nita Sickle, the attending physician, personally viewed and interpreted this ECG.  Normal sinus rhythm, rate of 61, normal intervals, normal axis, no ST elevations or depressions. ____________________________________________  RADIOLOGY  CXR: Negative  ____________________________________________   PROCEDURES  Procedure(s) performed: None Procedures Critical Care performed:  None ____________________________________________   INITIAL IMPRESSION / ASSESSMENT AND PLAN / ED COURSE   35 y.o. male the history of IV drug use who presents for positive blood cultures in the setting of 2 days of fever, chills, and body aches. Patient has been on clindamycin since ECHO was negative for vegetations yesterday. He is neuro intact, no HA or meningismus on exam. No murmur however patient does have splinter hemorrhages on b/l hands. VS WNL at this time . Will repeat labs including blood cultures, lactate, will get CXR, start patient on IVF and IV abx for bacteremia.  Clinical Course    Pertinent labs & imaging results  that were available during my care of the patient were reviewed by me and considered in my medical decision making (see chart for details).    ____________________________________________   FINAL CLINICAL IMPRESSION(S) / ED DIAGNOSES  Final diagnoses:  Bacteremia      NEW MEDICATIONS STARTED DURING THIS VISIT:  New Prescriptions   No medications on file     Note:  This document was prepared using Dragon voice recognition software and may include unintentional dictation errors.    Nita Sicklearolina Jason Frisbee, MD 05/06/16 1840

## 2016-05-06 NOTE — ED Notes (Signed)
Pt given meal tray and coke.  

## 2016-05-06 NOTE — ED Notes (Signed)
Per Dr.Paduchowski (ER MD) , " lab called "all blood cultures bottles are + and needs admission with ABX therapy",  Pharm Tresa EndoKelly was informed by Dr.Paduchowski to call the patient, Tresa EndoKelly was able to contact the patient and the pt verbalized understanding of coming back for a re-eval

## 2016-05-06 NOTE — Progress Notes (Signed)
Pharmacy Antibiotic Note  Randy FloodRichard L Dimercurio Jr. is a 35 y.o. male admitted on 05/06/2016 with bacteremia.  Pharmacy has been consulted for vancomycin/zosyn dosing.  Plan: Vancomycin 1500mg  IV every 8 hours. Goal trough 15-20 mcg/mL Zosyn 3.375g IV q8h (4 hour infusion)  Will initiated Vancomycin 1000mg  IV x 1 then Vancomycin 2000mg  after 1st dose per stacked dose protocol. Will order Vancomycin trough level prior to the fifth dose.   Ke=0.104 VD=95.27 T1/2=6.66 Estimated trough=18  Height: 5\' 9"  (175.3 cm) Weight: 300 lb (136.1 kg) IBW/kg (Calculated) : 70.7  Temp (24hrs), Avg:98.2 F (36.8 C), Min:98.2 F (36.8 C), Max:98.2 F (36.8 C)   Recent Labs Lab 05/05/16 0923 05/05/16 0924  WBC 13.1*  --   CREATININE 0.83  --   LATICACIDVEN  --  1.6    Estimated Creatinine Clearance: 168.6 mL/min (by C-G formula based on SCr of 0.83 mg/dL).    Allergies  Allergen Reactions  . Bactrim [Sulfamethoxazole-Trimethoprim]   . Sulfa Antibiotics     Antimicrobials this admission: Vancomycin 8/24 >>  Zosyn 8/24 >>   Microbiology results: 8/23 BCx: staphylococcus aureus  8/24 UCx: in process   Thank you for allowing pharmacy to be a part of this patient's care.  Randy Woods 05/06/2016 4:29 PM

## 2016-05-06 NOTE — Progress Notes (Signed)
Anticoagulation monitoring(Lovenox):  35 yo  Male ordered Lovenox 40 mg Q24h  Filed Weights   05/06/16 1601  Weight: 300 lb (136.1 kg)   BMI 42.9   Lab Results  Component Value Date   CREATININE 0.66 05/06/2016   CREATININE 0.83 05/05/2016   CREATININE 1.03 04/22/2016   Estimated Creatinine Clearance: 175 mL/min (by C-G formula based on SCr of 0.8 mg/dL). Hemoglobin & Hematocrit     Component Value Date/Time   HGB 14.8 05/06/2016 1724   HGB 14.3 05/27/2014 2001   HCT 42.4 05/06/2016 1724   HCT 42.8 05/27/2014 2001     Per Protocol for Patient with estCrcl < 30 ml/min and BMI < 40, will transition to Lovenox 40 mg Q12h.

## 2016-05-06 NOTE — ED Notes (Addendum)
Unable to establish iv - pt to xr.

## 2016-05-06 NOTE — ED Notes (Signed)
Pt had iv antibiotics yesterday when the original blood cultures drawn. 2 more sets ordered

## 2016-05-07 DIAGNOSIS — F3162 Bipolar disorder, current episode mixed, moderate: Secondary | ICD-10-CM

## 2016-05-07 LAB — RAPID HIV SCREEN (HIV 1/2 AB+AG)
HIV 1/2 Antibodies: NONREACTIVE
HIV-1 P24 Antigen - HIV24: NONREACTIVE

## 2016-05-07 LAB — LACTIC ACID, PLASMA: Lactic Acid, Venous: 1.1 mmol/L (ref 0.5–1.9)

## 2016-05-07 MED ORDER — NAFCILLIN SODIUM 2 G IJ SOLR
2.0000 g | Freq: Four times a day (QID) | INTRAVENOUS | Status: DC
  Administered 2016-05-07 – 2016-05-11 (×14): 2 g via INTRAVENOUS
  Filled 2016-05-07 (×17): qty 2000

## 2016-05-07 MED ORDER — BUPRENORPHINE HCL 2 MG SL SUBL
4.0000 mg | SUBLINGUAL_TABLET | SUBLINGUAL | Status: DC
  Administered 2016-05-07 – 2016-05-20 (×14): 4 mg via SUBLINGUAL
  Filled 2016-05-07 (×17): qty 2

## 2016-05-07 MED ORDER — QUETIAPINE FUMARATE 100 MG PO TABS
100.0000 mg | ORAL_TABLET | Freq: Every day | ORAL | Status: DC
  Administered 2016-05-07 – 2016-05-20 (×14): 100 mg via ORAL
  Filled 2016-05-07: qty 1
  Filled 2016-05-07 (×2): qty 4
  Filled 2016-05-07 (×2): qty 1
  Filled 2016-05-07: qty 4
  Filled 2016-05-07 (×5): qty 1
  Filled 2016-05-07: qty 4
  Filled 2016-05-07 (×2): qty 1

## 2016-05-07 MED ORDER — BUPRENORPHINE HCL 8 MG SL SUBL
8.0000 mg | SUBLINGUAL_TABLET | Freq: Two times a day (BID) | SUBLINGUAL | Status: DC
  Administered 2016-05-07 – 2016-05-21 (×28): 8 mg via SUBLINGUAL
  Filled 2016-05-07 (×5): qty 4
  Filled 2016-05-07 (×4): qty 1
  Filled 2016-05-07 (×3): qty 4
  Filled 2016-05-07: qty 1
  Filled 2016-05-07: qty 4
  Filled 2016-05-07: qty 2
  Filled 2016-05-07 (×6): qty 4
  Filled 2016-05-07: qty 1
  Filled 2016-05-07 (×8): qty 4

## 2016-05-07 MED ORDER — NAFCILLIN SODIUM 2 G IJ SOLR
2.0000 g | INTRAMUSCULAR | Status: DC
  Filled 2016-05-07: qty 2000

## 2016-05-07 NOTE — Progress Notes (Signed)
Pharmacy Antibiotic Note  Randy FloodRichard L Scaduto Jr. is a 35 y.o. male admitted on 05/06/2016 with MSSA bacteremia.    Pharmacy has been consulted for nafcillin dosing.  Plan: Will start patient on nafcillin 2gm IV every 4 hours.   Height: 5\' 9"  (175.3 cm) Weight: 300 lb (136.1 kg) IBW/kg (Calculated) : 70.7  Temp (24hrs), Avg:98.3 F (36.8 C), Min:98.1 F (36.7 C), Max:98.6 F (37 C)   Recent Labs Lab 05/05/16 0923 05/05/16 0924 05/06/16 1724 05/06/16 1734 05/06/16 2302  WBC 13.1*  --  8.8  --   --   CREATININE 0.83  --  0.66  --   --   LATICACIDVEN  --  1.6  --  1.5 1.1    Estimated Creatinine Clearance: 175 mL/min (by C-G formula based on SCr of 0.8 mg/dL).    Allergies  Allergen Reactions  . Bactrim [Sulfamethoxazole-Trimethoprim]   . Sulfa Antibiotics     Antimicrobials this admission: 8/24 Vancomycin  >> 8/25 8/25 Nafcillin  >>    Microbiology results: 8/23 BCx: STAPHYLOCOCCUS AUREUS  Thank you for allowing pharmacy to be a part of this patient's care.  Cher NakaiSheema Sayed Apostol, PharmD Clinical Pharmacist 05/07/2016 7:05 PM

## 2016-05-07 NOTE — Plan of Care (Signed)
Patient called insisting to take shower.  Educated that he is on continuous fluids, and multiple anbx d/t his current infection that appears to have gone septic.  We need to maintain his PICC lin and cannot risk further infection or the inability to continue to treat his infection.  Patient demanded to contact doctor to see if he can take shower again. Prime doc paged and Dr. Winona LegatoVaickute returned page giving verbal orders for patient to not take a shower till the infection is in better control.

## 2016-05-07 NOTE — Consult Note (Signed)
[facility] Face-to-Face Psychiatry Consult   Reason for Consult:  Consult for 35 year old man with a history of opiate abuse and mood instability. Referring Physician:  Earleen Newport Patient Identification: Randy Woods. MRN:  [id number] Principal Diagnosis: Bipolar 1 disorder, mixed, moderate ([place]) Diagnosis:   Patient Active Problem List   Diagnosis Date Noted  . Bipolar 1 disorder, mixed, moderate ([place]) [F31.62] 05/07/2016  . Staphylococcal sepsis ([place]) [A41.2] 05/06/2016  . Severe recurrent major depression without psychotic features ([place]) [F33.2] 05/22/2015  . Opiate abuse, continuous [F11.10] 05/22/2015  . Cocaine abuse [F14.10] 05/22/2015    Total Time spent with patient: 1 hour  Subjective:   Randy Woods. is a 35 y.o. male patient admitted with "I know I have bipolar issues".  HPI:  Patient interviewed. Chart reviewed. Labs reviewed. 35 year old man with heroin dependence. In the hospital for sepsis. Being treated with IV antibiotics. Patient admits that he had gone back to using intravenous heroin recently. He says that most of the time he is maintained on buprenorphine but occasionally he will sell it if he needs the money and then relapse into heroin use which is what happened recently. Patient is feeling comfortable and not having any withdrawal symptoms now that he has been put back on his buprenorphine. He still feels that his mood is depressed and labile at times. Has a great deal of trouble sleeping. Has had passive suicidal thoughts but nothing active no intent or plan. No psychosis. No violent behavior.  Social history: Living with his mother. Not working regularly. Occasionally gets side jobs.  Medical history: Newly diagnosed sepsis probably related to intravenous drug use. Probably going to need IV antibiotics for several weeks. History of obesity  Substance abuse history: Long-standing intravenous drug use. Cocaine is also positive although the patient absolutely denies  that he's been using any cocaine recently. He has engaged in appropriate outpatient treatment.  Past Psychiatric History: History of psychiatric hospitalization. No history of current suicidal thoughts but does have a history of self injury self-mutilation and suicidal ideation. Has been on Geodon and Seroquel in the past with good response.  Risk to Self: Is patient at risk for suicide?: No Risk to Others:   Prior Inpatient Therapy:   Prior Outpatient Therapy:    Past Medical History:  Past Medical History:  Diagnosis Date  . Anxiety   . Bronchitis   . Cocaine abuse   . Depression   . Drug abuse   . Heroin abuse     Past Surgical History:  Procedure Laterality Date  . NOSE SURGERY    . TYMPANOPLASTY     Family History:  Family History  Problem Relation Age of Onset  . Diabetes Mother   . Hypertension Father   . Kidney failure Father   . Diabetes Father    Family Psychiatric  History: Bipolar disorder present in her father and sister Social History:  History  Alcohol Use  . Yes     History  Drug Use    Comment: HEROIN    Social History   Social History  . Marital status: Widowed    Spouse name: N/A  . Number of children: N/A  . Years of education: N/A   Social History Main Topics  . Smoking status: Current Every Day Smoker  . Smokeless tobacco: Current User    Types: Snuff  . Alcohol use Yes  . Drug use:      Comment: HEROIN  . Sexual activity: Not Asked  Other Topics Concern  . None   Social History Narrative  . None   Additional Social History:    Allergies:   Allergies  Allergen Reactions  . Bactrim [Sulfamethoxazole-Trimethoprim]   . Sulfa Antibiotics     Labs:  Results for orders placed or performed during the hospital encounter of 05/06/16 (from the past 48 hour(s))  Comprehensive metabolic panel     Status: Abnormal   Collection Time: 05/06/16  5:24 PM  Result Value Ref Range   Sodium 136 135 - 145 mmol/L   Potassium 4.3 3.5 -  5.1 mmol/L    Comment: HEMOLYSIS AT THIS LEVEL MAY AFFECT RESULT   Chloride 105 101 - 111 mmol/L   CO2 24 22 - 32 mmol/L   Glucose, Bld 120 (H) 65 - 99 mg/dL   BUN 11 6 - 20 mg/dL   Creatinine, Ser 0.66 0.61 - 1.24 mg/dL   Calcium 8.3 (L) 8.9 - 10.3 mg/dL   Total Protein 6.8 6.5 - 8.1 g/dL   Albumin 3.5 3.5 - 5.0 g/dL   AST 38 15 - 41 U/L    Comment: HEMOLYSIS AT THIS LEVEL MAY AFFECT RESULT   ALT 44 17 - 63 U/L   Alkaline Phosphatase 42 38 - 126 U/L   Total Bilirubin 0.7 0.3 - 1.2 mg/dL    Comment: HEMOLYSIS AT THIS LEVEL MAY AFFECT RESULT   GFR calc non Af Amer >60 >60 mL/min   GFR calc Af Amer >60 >60 mL/min    Comment: (NOTE) The eGFR has been calculated using the CKD EPI equation. This calculation has not been validated in all clinical situations. eGFR's persistently <60 mL/min signify possible Chronic Kidney Disease.    Anion gap 7 5 - 15  CBC WITH DIFFERENTIAL     Status: Abnormal   Collection Time: 05/06/16  5:24 PM  Result Value Ref Range   WBC 8.8 3.8 - 10.6 K/uL   RBC 4.80 4.40 - 5.90 MIL/uL   Hemoglobin 14.8 13.0 - 18.0 g/dL   HCT 42.4 40.0 - 52.0 %   MCV 88.3 80.0 - 100.0 fL   MCH 30.9 26.0 - 34.0 pg   MCHC 35.0 32.0 - 36.0 g/dL   RDW 12.6 11.5 - 14.5 %   Platelets 118 (L) 150 - 440 K/uL   Neutrophils Relative % 67 %   Neutro Abs 5.9 1.4 - 6.5 K/uL   Lymphocytes Relative 22 %   Lymphs Abs 2.0 1.0 - 3.6 K/uL   Monocytes Relative 9 %   Monocytes Absolute 0.8 0.2 - 1.0 K/uL   Eosinophils Relative 2 %   Eosinophils Absolute 0.1 0 - 0.7 K/uL   Basophils Relative 0 %   Basophils Absolute 0.0 0 - 0.1 K/uL  Blood Culture (routine x 2)     Status: None (Preliminary result)   Collection Time: 05/06/16  5:34 PM  Result Value Ref Range   Specimen Description BLOOD LEFT HAND    Special Requests      BOTTLES DRAWN AEROBIC AND ANAEROBIC  ANA 7ML AER 3ML   Culture NO GROWTH < 24 HOURS    Report Status PENDING   Lactic acid, plasma     Status: None   Collection  Time: 05/06/16  5:34 PM  Result Value Ref Range   Lactic Acid, Venous 1.5 0.5 - 1.9 mmol/L  Urinalysis complete, with microscopic ([facility] only)     Status: Abnormal   Collection Time: 05/06/16  6:30 PM  Result Value Ref Range  Color, Urine YELLOW (A) YELLOW   APPearance CLEAR (A) CLEAR   Glucose, UA NEGATIVE NEGATIVE mg/dL   Bilirubin Urine NEGATIVE NEGATIVE   Ketones, ur NEGATIVE NEGATIVE mg/dL   Specific Gravity, Urine 1.019 1.005 - 1.030   Hgb urine dipstick 2+ (A) NEGATIVE   pH 6.0 5.0 - 8.0   Protein, ur NEGATIVE NEGATIVE mg/dL   Nitrite NEGATIVE NEGATIVE   Leukocytes, UA NEGATIVE NEGATIVE   RBC / HPF 6-30 0 - 5 RBC/hpf   WBC, UA 0-5 0 - 5 WBC/hpf   Bacteria, UA NONE SEEN NONE SEEN   Squamous Epithelial / LPF 0-5 (A) NONE SEEN  Lactic acid, plasma     Status: None   Collection Time: 05/06/16 11:02 PM  Result Value Ref Range   Lactic Acid, Venous 1.1 0.5 - 1.9 mmol/L  Blood Culture (routine x 2)     Status: None (Preliminary result)   Collection Time: 05/06/16 11:25 PM  Result Value Ref Range   Specimen Description BLOOD RIGHT HAND    Special Requests      BOTTLES DRAWN AEROBIC AND ANAEROBIC AER 5ML ANA .5ML   Culture NO GROWTH < 12 HOURS    Report Status PENDING     Current Facility-Administered Medications  Medication Dose Route Frequency Provider Last Rate Last Dose  . 0.9 %  sodium chloride infusion   Intravenous Continuous Loletha Grayer, MD 30 mL/hr at 05/07/16 1536    . acetaminophen (TYLENOL) tablet 650 mg  650 mg Oral Q6H PRN Lance Coon, MD   650 mg at 05/07/16 1538  . buprenorphine (SUBUTEX) SL tablet 4 mg  4 mg Sublingual PC lunch Loletha Grayer, MD   4 mg at 05/07/16 1535  . buprenorphine (SUBUTEX) sublingual tablet 8 mg  8 mg Sublingual BID Loletha Grayer, MD      . enoxaparin (LOVENOX) injection 40 mg  40 mg Subcutaneous Q12H Fritzi Mandes, MD   40 mg at 05/07/16 1010  . nicotine (NICODERM CQ - dosed in mg/24 hours) patch 21 mg  21 mg Transdermal Daily  Lance Coon, MD   21 mg at 05/07/16 1013  . ondansetron (ZOFRAN) tablet 4 mg  4 mg Oral Q6H PRN Fritzi Mandes, MD       Or  . ondansetron (ZOFRAN) injection 4 mg  4 mg Intravenous Q6H PRN Fritzi Mandes, MD      . QUEtiapine (SEROQUEL) tablet 100 mg  100 mg Oral QHS Gonzella Lex, MD      . traMADol Veatrice Bourbon) tablet 50 mg  50 mg Oral Q6H PRN Fritzi Mandes, MD      . vancomycin (VANCOCIN) 1,500 mg in sodium chloride 0.9 % 500 mL IVPB  1,500 mg Intravenous Q8H Loree Fee, RPH   1,500 mg at 05/07/16 0277    Musculoskeletal: Strength & Muscle Tone: within normal limits Gait & Station: normal Patient leans: N/A  Psychiatric Specialty Exam: Physical Exam  Nursing note and vitals reviewed. Constitutional: He appears well-developed and well-nourished.  HENT:  Head: Normocephalic and atraumatic.  Eyes: Conjunctivae are normal. Pupils are equal, round, and reactive to light.  Neck: Normal range of motion.  Cardiovascular: Regular rhythm and normal heart sounds.   Respiratory: Effort normal. No respiratory distress.  GI: Soft.  Musculoskeletal: Normal range of motion.  Neurological: He is alert.  Skin: Skin is warm and dry.  Psychiatric: His speech is normal and behavior is normal. Judgment normal. His affect is blunt. Cognition and memory are normal. He expresses  no suicidal ideation.    Review of Systems  Constitutional: Positive for chills, fever and malaise/fatigue.  HENT: Negative.   Eyes: Negative.   Respiratory: Negative.   Cardiovascular: Negative.   Gastrointestinal: Negative.   Musculoskeletal: Negative.   Skin: Negative.   Neurological: Negative.   Psychiatric/Behavioral: Positive for depression and substance abuse. Negative for hallucinations, memory loss and suicidal ideas. The patient has insomnia. The patient is not nervous/anxious.     Blood pressure 125/73, pulse 81, temperature 98.1 F (36.7 C), temperature source Oral, resp. rate 19, height _0  (1.753 m), weight  136.1 kg (300 lb), SpO2 98 %.Body mass index is 44.3 kg/m.  General Appearance: Fairly Groomed  Eye Contact:  Good  Speech:  Clear and Coherent  Volume:  Normal  Mood:  Anxious  Affect:  Congruent  Thought Process:  Goal Directed  Orientation:  Full (Time, Place, and Person)  Thought Content:  Logical  Suicidal Thoughts:  No  Homicidal Thoughts:  No  Memory:  Immediate;   Good Recent;   Good Remote;   Good  Judgement:  Good  Insight:  Good  Psychomotor Activity:  Normal  Concentration:  Concentration: Fair  Recall:  [employer] of Knowledge:  Fair  Language:  Fair  Akathisia:  No  Handed:  Right  AIMS (if indicated):     Assets:  Communication Skills Desire for Improvement Housing Resilience  ADL's:  Intact  Cognition:  WNL  Sleep:        Treatment Plan Summary: Daily contact with patient to assess and evaluate symptoms and progress in treatment, Medication management and Plan We discussed treatment for bipolar disorder and I will add Seroquel 100 mg at night. Patient is aware of side effects and appreciates the assistance it will give him with sleeping. Continue buprenorphine as prescribed if that is his outpatient dose. Supportive counseling and discussion of long-term treatment. I will follow-up as needed.  Disposition: Patient does not meet criteria for psychiatric inpatient admission. Supportive therapy provided about ongoing stressors.  Alethia Berthold, MD 05/07/2016 6:37 PM

## 2016-05-07 NOTE — Progress Notes (Signed)
Patient ID: Randy Floodichard L Houchin Jr., male   DOB: 02-01-1981, 35 y.o.   MRN: [id number]  [employer] PROGRESS NOTE  Randy Floodichard L Prosperi Jr. JXB:[id number] DOB: 02-01-1981 DOA: 05/06/2016 PCP: [facility]  HPI/Subjective: Patient feels okay. Had fever at home. Some chest pain this morning. Note shortness of breath.  Objective: Vitals:   05/07/16 0522 05/07/16 1300  BP: 128/80 125/73  Pulse: 85 81  Resp: (!) 21 19  Temp: 98.3 F (36.8 C) 98.1 F (36.7 C)    Filed Weights   05/06/16 1601  Weight: 136.1 kg (300 lb)    ROS: Review of Systems  Constitutional: Negative for chills and fever.  Eyes: Negative for blurred vision.  Respiratory: Negative for cough and shortness of breath.   Cardiovascular: Positive for chest pain.  Gastrointestinal: Negative for abdominal pain, constipation, diarrhea, nausea and vomiting.  Genitourinary: Negative for dysuria.  Musculoskeletal: Negative for joint pain.  Neurological: Negative for dizziness and headaches.   Exam: Physical Exam  Constitutional: He is oriented to person, place, and time.  HENT:  Nose: No mucosal edema.  Mouth/Throat: No oropharyngeal exudate or posterior oropharyngeal edema.  Eyes: Conjunctivae, EOM and lids are normal. Pupils are equal, round, and reactive to light.  Neck: No JVD present. Carotid bruit is not present. No edema present. No thyroid mass and no thyromegaly present.  Cardiovascular: S1 normal and S2 normal.  Exam reveals no gallop.   No murmur heard. Pulses:      Dorsalis pedis pulses are 2+ on the right side, and 2+ on the left side.  Respiratory: No respiratory distress. He has no wheezes. He has no rhonchi. He has no rales.  GI: Soft. Bowel sounds are normal. There is no tenderness.  Musculoskeletal:       Right ankle: He exhibits swelling.       Left ankle: He exhibits swelling.  Lymphadenopathy:    He has no cervical adenopathy.  Neurological: He is alert and oriented to person, place, and  time. No cranial nerve deficit.  Skin: Skin is warm. No rash noted. Nails show no clubbing.  Psychiatric: He has a normal mood and affect.      Data Reviewed: Basic Metabolic Panel:  Recent Labs Lab 05/05/16 0923 05/06/16 1724  NA 131* 136  K 4.2 4.3  CL 99* 105  CO2 25 24  GLUCOSE 127* 120*  BUN 12 11  CREATININE 0.83 0.66  CALCIUM 8.7* 8.3*   Liver Function Tests:  Recent Labs Lab 05/05/16 0923 05/06/16 1724  AST 40 38  ALT 47 44  ALKPHOS 54 42  BILITOT 0.7 0.7  PROT 8.0 6.8  ALBUMIN 4.1 3.5   CBC:  Recent Labs Lab 05/05/16 0923 05/06/16 1724  WBC 13.1* 8.8  NEUTROABS 10.0* 5.9  HGB 15.9 14.8  HCT 45.6 42.4  MCV 87.0 88.3  PLT 148* 118*   Cardiac Enzymes:  Recent Labs Lab 05/05/16 0923  TROPONINI <0.03     Recent Results (from the past 240 hour(s))  Blood Culture (routine x 2)     Status: Abnormal (Preliminary result)   Collection Time: 05/05/16  9:23 AM  Result Value Ref Range Status   Specimen Description BLOOD LEFT HAND  Final   Special Requests   Final    BOTTLES DRAWN AEROBIC AND ANAEROBIC AER 5ML ANA 9ML   Culture  Setup Time   Final    GRAM POSITIVE COCCI IN BOTH AEROBIC AND ANAEROBIC BOTTLES CRITICAL RESULT CALLED TO, READ BACK  BY AND VERIFIED WITH: MATT MCBANE @ 0011 ON 05/06/2016 BY CAF Organism ID to follow    Culture (A)  Final    STAPHYLOCOCCUS AUREUS SUSCEPTIBILITIES TO FOLLOW Performed at [facility]    Report Status PENDING  Incomplete  Blood Culture ID Panel (Reflexed)     Status: Abnormal   Collection Time: 05/05/16  9:23 AM  Result Value Ref Range Status   Enterococcus species NOT DETECTED NOT DETECTED Final   Vancomycin resistance NOT DETECTED NOT DETECTED Final   Listeria monocytogenes NOT DETECTED NOT DETECTED Final   Staphylococcus species DETECTED (A) NOT DETECTED Final    Comment: CRITICAL RESULT CALLED TO, READ BACK BY AND VERIFIED WITH: MATT MCBANE @ 0011 ON 05/06/2016 BY CAF    Staphylococcus  aureus DETECTED (A) NOT DETECTED Final    Comment: CRITICAL RESULT CALLED TO, READ BACK BY AND VERIFIED WITH: MATT MCBANE @ 0011 ON 05/06/2016 BY CAF    Methicillin resistance NOT DETECTED NOT DETECTED Final   Streptococcus species NOT DETECTED NOT DETECTED Final   Streptococcus agalactiae NOT DETECTED NOT DETECTED Final   Streptococcus pneumoniae NOT DETECTED NOT DETECTED Final   Streptococcus pyogenes NOT DETECTED NOT DETECTED Final   Acinetobacter baumannii NOT DETECTED NOT DETECTED Final   Enterobacteriaceae species NOT DETECTED NOT DETECTED Final   Enterobacter cloacae complex NOT DETECTED NOT DETECTED Final   Escherichia coli NOT DETECTED NOT DETECTED Final   Klebsiella oxytoca NOT DETECTED NOT DETECTED Final   Klebsiella pneumoniae NOT DETECTED NOT DETECTED Final   Proteus species NOT DETECTED NOT DETECTED Final   Serratia marcescens NOT DETECTED NOT DETECTED Final   Carbapenem resistance NOT DETECTED NOT DETECTED Final   Haemophilus influenzae NOT DETECTED NOT DETECTED Final   Neisseria meningitidis NOT DETECTED NOT DETECTED Final   Pseudomonas aeruginosa NOT DETECTED NOT DETECTED Final   Candida albicans NOT DETECTED NOT DETECTED Final   Candida glabrata NOT DETECTED NOT DETECTED Final   Candida krusei NOT DETECTED NOT DETECTED Final   Candida parapsilosis NOT DETECTED NOT DETECTED Final   Candida tropicalis NOT DETECTED NOT DETECTED Final  Blood Culture (routine x 2)     Status: Abnormal (Preliminary result)   Collection Time: 05/05/16  9:28 AM  Result Value Ref Range Status   Specimen Description BLOOD LEFT AC  Final   Special Requests   Final    BOTTLES DRAWN AEROBIC AND ANAEROBIC AER ANA   Culture  Setup Time   Final    GRAM POSITIVE COCCI IN BOTH AEROBIC AND ANAEROBIC BOTTLES CRITICAL RESULT CALLED TO, READ BACK BY AND VERIFIED WITH: MATT MCBANE @ 0011 ON 05/06/2016 BY CAF    Culture STAPHYLOCOCCUS AUREUS (A)  Final   Report Status PENDING  Incomplete   Blood Culture (routine x 2)     Status: None (Preliminary result)   Collection Time: 05/06/16  5:34 PM  Result Value Ref Range Status   Specimen Description BLOOD LEFT HAND  Final   Special Requests   Final    BOTTLES DRAWN AEROBIC AND ANAEROBIC  ANA AER   Culture NO GROWTH < 24 HOURS  Final   Report Status PENDING  Incomplete  Blood Culture (routine x 2)     Status: None (Preliminary result)   Collection Time: 05/06/16 11:25 PM  Result Value Ref Range Status   Specimen Description BLOOD RIGHT HAND  Final   Special Requests   Final    BOTTLES DRAWN AEROBIC AND ANAEROBIC AER  ANA .   Culture NO GROWTH < 12 HOURS  Final   Report Status PENDING  Incomplete     Studies: Dg Chest 2 View  Result Date: 05/06/2016 CLINICAL DATA:  Sepsis, fever for 3 days EXAM: CHEST  2 VIEW COMPARISON:  05/05/2016 FINDINGS: The heart size and mediastinal contours are within normal limits. Both lungs are clear. The visualized skeletal structures are unremarkable. IMPRESSION: No active cardiopulmonary disease. Electronically Signed   By: [name] M.D.   On: 05/06/2016 16:44   Dg Chest Portable 1 View  Result Date: 05/06/2016 CLINICAL DATA:  PICC placement. EXAM: PORTABLE CHEST 1 VIEW COMPARISON:  Frontal and lateral views earlier this day. FINDINGS: Tip of the right upper extremity PICC in the mid SVC. No pneumothorax. The cardiomediastinal contours are normal. The lungs are clear. Pulmonary vasculature is normal. No consolidation, pleural effusion, or pneumothorax. No acute osseous abnormalities are seen. IMPRESSION: Tip of the right upper extremity PICC in the mid SVC. No pneumothorax or acute abnormality. Electronically Signed   By: [name] M.D.   On: 05/06/2016 21:09    Scheduled Meds: . buprenorphine  4 mg Sublingual PC lunch  . buprenorphine  8 mg Sublingual BID  . enoxaparin (LOVENOX) injection  40 mg Subcutaneous Q12H  . nicotine  21 mg Transdermal Daily  . vancomycin  1,500  mg Intravenous Q8H   Continuous Infusions: . sodium chloride 30 mL/hr at 05/07/16 1536    Assessment/Plan:  1. Sepsis Staphylococcus species. Patient with history of IVDA. TTE negative for endocarditis. Case discussed with Dr. [name] infectious disease and patient will need a TEE. I spoke with Dr. Welton Flakes cardiology to do a TEE on Monday. He will put an order for this TEE test. Length of therapy will depend on whether or not this is an endocarditis or not. Repeat cultures so far negative. Secondary to poor IV access patient already has a PICC line in. 2. Substance abuse on buprenorphine 3. Tobacco abuse on nicotine patch  Code Status:     Code Status Orders        Start     Ordered   05/06/16 2147  Full code  Continuous     05/06/16 2146    Code Status History    Date Active Date Inactive Code Status Order ID Comments User Context   This patient has a current code status but no historical code status.     Family Communication: Family at bedside Disposition Plan: Will receive IV antibiotics over the weekend TEE on Monday and then determination of plan after that.  Consultants:  Infectious disease  Antibiotics:  Vancomycin  Time spent: 25 minutes  Alford Highland  [employer]

## 2016-05-07 NOTE — Consult Note (Signed)
[facility] Infectious Disease     Reason for Consult: Staph aureus bacteremia in IVDU patient   Referring Physician:  Date of Admission:  05/06/2016   Active Problems:   Staphylococcal sepsis [facility])   HPI: Randy Woods. is a 35 y.o. 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  No prior hx of bacteremias or endocarditis. No active skin infections. No back pain, neck pain or joint pain or swelling.   Past Medical History:  Diagnosis Date  . Anxiety   . Bronchitis   . Cocaine abuse   . Depression   . Drug abuse   . Heroin abuse    Past Surgical History:  Procedure Laterality Date  . NOSE SURGERY    . TYMPANOPLASTY     Social History  Substance Use Topics  . Smoking status: Current Every Day Smoker  . Smokeless tobacco: Current User    Types: Snuff  . Alcohol use Yes   Family History  Problem Relation Age of Onset  . Diabetes Mother   . Hypertension Father   . Kidney failure Father   . Diabetes Father     Allergies:  Allergies  Allergen Reactions  . Bactrim [Sulfamethoxazole-Trimethoprim]   . Sulfa Antibiotics     Current antibiotics: Antibiotics Given (last 72 hours)    Date/Time Action Medication Dose Rate   05/06/16 2351 Given   vancomycin (VANCOCIN) 1,500 mg in sodium chloride 0.9 % 500 mL IVPB 1,500 mg 250 mL/hr   05/07/16 1109 Given  [med not avail. - Pharm informed]   vancomycin (VANCOCIN) 1,500 mg in sodium chloride 0.9 % 500 mL IVPB 1,500 mg 250 mL/hr      MEDICATIONS: . buprenorphine  4 mg Sublingual PC lunch  . buprenorphine  8 mg Sublingual BID  . enoxaparin (LOVENOX) injection  40 mg Subcutaneous Q12H  . linezolid  600 mg Oral Once  . nicotine  21 mg Transdermal Daily  . vancomycin  1,500 mg Intravenous Q8H    Review of Systems - 11 systems reviewed and negative per HPI   OBJECTIVE: Temp:  [98.1 F (36.7 C)-98.3 F (36.8 C)] 98.1 F (36.7 C) (08/25 1300) Pulse Rate:  [75-89] 81 (08/25  1300) Resp:  [15-21] 19 (08/25 1300) BP: (124-154)/(57-84) 125/73 (08/25 1300) SpO2:  [95 %-100 %] 98 % (08/25 1300) Weight:  [136.1 kg (300 lb)] 136.1 kg (300 lb) (08/24 1601) Physical Exam  Constitutional: He is oriented to person, place, and time. Obese HENT: anicteric,  Mouth/Throat: Oropharynx is clear and moist. No oropharyngeal exudate.  Cardiovascular: Normal rate, regular rhythm and normal heart sounds. No murmur heard.  Pulmonary/Chest: Effort normal and breath sounds normal. No respiratory distress. He has no wheezes.  Abdominal: Soft. Bowel sounds are normal. He exhibits no distension. There is no tenderness.  Lymphadenopathy: no cervical adenopathy.  Neurological: He is alert and oriented to person, place, and time.  Skin: Skin is warm and dry. No rash noted. No erythema. L forearm with small scab Psychiatric: He has a normal mood and affect. His behavior is normal.   LABS: Results for orders placed or performed during the hospital encounter of 05/06/16 (from the past 48 hour(s))  Comprehensive metabolic panel     Status: Abnormal   Collection Time: 05/06/16  5:24 PM  Result Value Ref Range   Sodium 136 135 - 145 mmol/L   Potassium 4.3 3.5 - 5.1 mmol/L    Comment: HEMOLYSIS AT THIS LEVEL MAY  AFFECT RESULT   Chloride 105 101 - 111 mmol/L   CO2 24 22 - 32 mmol/L   Glucose, Bld 120 (H) 65 - 99 mg/dL   BUN 11 6 - 20 mg/dL   Creatinine, Ser 0.66 0.61 - 1.24 mg/dL   Calcium 8.3 (L) 8.9 - 10.3 mg/dL   Total Protein 6.8 6.5 - 8.1 g/dL   Albumin 3.5 3.5 - 5.0 g/dL   AST 38 15 - 41 U/L    Comment: HEMOLYSIS AT THIS LEVEL MAY AFFECT RESULT   ALT 44 17 - 63 U/L   Alkaline Phosphatase 42 38 - 126 U/L   Total Bilirubin 0.7 0.3 - 1.2 mg/dL    Comment: HEMOLYSIS AT THIS LEVEL MAY AFFECT RESULT   GFR calc non Af Amer >60 >60 mL/min   GFR calc Af Amer >60 >60 mL/min    Comment: (NOTE) The eGFR has been calculated using the CKD EPI equation. This calculation has not been  validated in all clinical situations. eGFR's persistently <60 mL/min signify possible Chronic Kidney Disease.    Anion gap 7 5 - 15  CBC WITH DIFFERENTIAL     Status: Abnormal   Collection Time: 05/06/16  5:24 PM  Result Value Ref Range   WBC 8.8 3.8 - 10.6 K/uL   RBC 4.80 4.40 - 5.90 MIL/uL   Hemoglobin 14.8 13.0 - 18.0 g/dL   HCT 42.4 40.0 - 52.0 %   MCV 88.3 80.0 - 100.0 fL   MCH 30.9 26.0 - 34.0 pg   MCHC 35.0 32.0 - 36.0 g/dL   RDW 12.6 11.5 - 14.5 %   Platelets 118 (L) 150 - 440 K/uL   Neutrophils Relative % 67 %   Neutro Abs 5.9 1.4 - 6.5 K/uL   Lymphocytes Relative 22 %   Lymphs Abs 2.0 1.0 - 3.6 K/uL   Monocytes Relative 9 %   Monocytes Absolute 0.8 0.2 - 1.0 K/uL   Eosinophils Relative 2 %   Eosinophils Absolute 0.1 0 - 0.7 K/uL   Basophils Relative 0 %   Basophils Absolute 0.0 0 - 0.1 K/uL  Blood Culture (routine x 2)     Status: None (Preliminary result)   Collection Time: 05/06/16  5:34 PM  Result Value Ref Range   Specimen Description BLOOD LEFT HAND    Special Requests      BOTTLES DRAWN AEROBIC AND ANAEROBIC  ANA 7ML AER 3ML   Culture NO GROWTH < 24 HOURS    Report Status PENDING   Lactic acid, plasma     Status: None   Collection Time: 05/06/16  5:34 PM  Result Value Ref Range   Lactic Acid, Venous 1.5 0.5 - 1.9 mmol/L  Urinalysis complete, with microscopic ([facility] only)     Status: Abnormal   Collection Time: 05/06/16  6:30 PM  Result Value Ref Range   Color, Urine YELLOW (A) YELLOW   APPearance CLEAR (A) CLEAR   Glucose, UA NEGATIVE NEGATIVE mg/dL   Bilirubin Urine NEGATIVE NEGATIVE   Ketones, ur NEGATIVE NEGATIVE mg/dL   Specific Gravity, Urine 1.019 1.005 - 1.030   Hgb urine dipstick 2+ (A) NEGATIVE   pH 6.0 5.0 - 8.0   Protein, ur NEGATIVE NEGATIVE mg/dL   Nitrite NEGATIVE NEGATIVE   Leukocytes, UA NEGATIVE NEGATIVE   RBC / HPF 6-30 0 - 5 RBC/hpf   WBC, UA 0-5 0 - 5 WBC/hpf   Bacteria, UA NONE SEEN NONE SEEN   Squamous Epithelial / LPF 0-5  (  A) NONE SEEN  Lactic acid, plasma     Status: None   Collection Time: 05/06/16 11:02 PM  Result Value Ref Range   Lactic Acid, Venous 1.1 0.5 - 1.9 mmol/L  Blood Culture (routine x 2)     Status: None (Preliminary result)   Collection Time: 05/06/16 11:25 PM  Result Value Ref Range   Specimen Description BLOOD RIGHT HAND    Special Requests      BOTTLES DRAWN AEROBIC AND ANAEROBIC AER 5ML ANA .5ML   Culture NO GROWTH < 12 HOURS    Report Status PENDING    No components found for: ESR, C REACTIVE PROTEIN MICRO: Recent Results (from the past 720 hour(s))  Blood Culture (routine x 2)     Status: Abnormal (Preliminary result)   Collection Time: 05/05/16  9:23 AM  Result Value Ref Range Status   Specimen Description BLOOD LEFT HAND  Final   Special Requests   Final    BOTTLES DRAWN AEROBIC AND ANAEROBIC AER 5ML ANA 9ML   Culture  Setup Time   Final    GRAM POSITIVE COCCI IN BOTH AEROBIC AND ANAEROBIC BOTTLES CRITICAL RESULT CALLED TO, READ BACK BY AND VERIFIED WITH: MATT MCBANE @ 0011 ON 05/06/2016 BY CAF Organism ID to follow    Culture (A)  Final    STAPHYLOCOCCUS AUREUS SUSCEPTIBILITIES TO FOLLOW Performed at [facility]    Report Status PENDING  Incomplete  Blood Culture ID Panel (Reflexed)     Status: Abnormal   Collection Time: 05/05/16  9:23 AM  Result Value Ref Range Status   Enterococcus species NOT DETECTED NOT DETECTED Final   Vancomycin resistance NOT DETECTED NOT DETECTED Final   Listeria monocytogenes NOT DETECTED NOT DETECTED Final   Staphylococcus species DETECTED (A) NOT DETECTED Final    Comment: CRITICAL RESULT CALLED TO, READ BACK BY AND VERIFIED WITH: MATT MCBANE @ 0011 ON 05/06/2016 BY CAF    Staphylococcus aureus DETECTED (A) NOT DETECTED Final    Comment: CRITICAL RESULT CALLED TO, READ BACK BY AND VERIFIED WITH: MATT MCBANE @ 0011 ON 05/06/2016 BY CAF    Methicillin resistance NOT DETECTED NOT DETECTED Final   Streptococcus species NOT  DETECTED NOT DETECTED Final   Streptococcus agalactiae NOT DETECTED NOT DETECTED Final   Streptococcus pneumoniae NOT DETECTED NOT DETECTED Final   Streptococcus pyogenes NOT DETECTED NOT DETECTED Final   Acinetobacter baumannii NOT DETECTED NOT DETECTED Final   Enterobacteriaceae species NOT DETECTED NOT DETECTED Final   Enterobacter cloacae complex NOT DETECTED NOT DETECTED Final   Escherichia coli NOT DETECTED NOT DETECTED Final   Klebsiella oxytoca NOT DETECTED NOT DETECTED Final   Klebsiella pneumoniae NOT DETECTED NOT DETECTED Final   Proteus species NOT DETECTED NOT DETECTED Final   Serratia marcescens NOT DETECTED NOT DETECTED Final   Carbapenem resistance NOT DETECTED NOT DETECTED Final   Haemophilus influenzae NOT DETECTED NOT DETECTED Final   Neisseria meningitidis NOT DETECTED NOT DETECTED Final   Pseudomonas aeruginosa NOT DETECTED NOT DETECTED Final   Candida albicans NOT DETECTED NOT DETECTED Final   Candida glabrata NOT DETECTED NOT DETECTED Final   Candida krusei NOT DETECTED NOT DETECTED Final   Candida parapsilosis NOT DETECTED NOT DETECTED Final   Candida tropicalis NOT DETECTED NOT DETECTED Final  Blood Culture (routine x 2)     Status: Abnormal (Preliminary result)   Collection Time: 05/05/16  9:28 AM  Result Value Ref Range Status   Specimen Description BLOOD LEFT AC  Final   Special Requests   Final    BOTTLES DRAWN AEROBIC AND ANAEROBIC AER 3ML ANA 2ML   Culture  Setup Time   Final    GRAM POSITIVE COCCI IN BOTH AEROBIC AND ANAEROBIC BOTTLES CRITICAL RESULT CALLED TO, READ BACK BY AND VERIFIED WITH: MATT MCBANE @ 0011 ON 05/06/2016 BY CAF    Culture STAPHYLOCOCCUS AUREUS (A)  Final   Report Status PENDING  Incomplete  Blood Culture (routine x 2)     Status: None (Preliminary result)   Collection Time: 05/06/16  5:34 PM  Result Value Ref Range Status   Specimen Description BLOOD LEFT HAND  Final   Special Requests   Final    BOTTLES DRAWN AEROBIC AND  ANAEROBIC  ANA 7ML AER 3ML   Culture NO GROWTH < 24 HOURS  Final   Report Status PENDING  Incomplete  Blood Culture (routine x 2)     Status: None (Preliminary result)   Collection Time: 05/06/16 11:25 PM  Result Value Ref Range Status   Specimen Description BLOOD RIGHT HAND  Final   Special Requests   Final    BOTTLES DRAWN AEROBIC AND ANAEROBIC AER 5ML ANA .5ML   Culture NO GROWTH < 12 HOURS  Final   Report Status PENDING  Incomplete    IMAGING: Dg Chest 2 View  Result Date: 05/06/2016 CLINICAL DATA:  Sepsis, fever for 3 days EXAM: CHEST  2 VIEW COMPARISON:  05/05/2016 FINDINGS: The heart size and mediastinal contours are within normal limits. Both lungs are clear. The visualized skeletal structures are unremarkable. IMPRESSION: No active cardiopulmonary disease. Electronically Signed   By: Lahoma Crocker M.D.   On: 05/06/2016 16:44   Dg Chest Portable 1 View  Result Date: 05/06/2016 CLINICAL DATA:  PICC placement. EXAM: PORTABLE CHEST 1 VIEW COMPARISON:  Frontal and lateral views earlier this day. FINDINGS: Tip of the right upper extremity PICC in the mid SVC. No pneumothorax. The cardiomediastinal contours are normal. The lungs are clear. Pulmonary vasculature is normal. No consolidation, pleural effusion, or pneumothorax. No acute osseous abnormalities are seen. IMPRESSION: Tip of the right upper extremity PICC in the mid SVC. No pneumothorax or acute abnormality. Electronically Signed   By: Jeb Levering M.D.   On: 05/06/2016 21:09   Dg Chest Port 1 View  Result Date: 05/05/2016 CLINICAL DATA:  Fever with chest pain and shortness of breath EXAM: PORTABLE CHEST 1 VIEW COMPARISON:  May 31, 2015 FINDINGS: Lungs are clear. The heart size and pulmonary vascularity are normal. No adenopathy. No bone lesions. IMPRESSION: No edema or consolidation. Electronically Signed   By: Lowella Grip III M.D.   On: 05/05/2016 10:06    Assessment:   Randy Woods. is a 35 y.o. male with hx  IVDU now with staph aureus  (MSSA) bacteremia . TTE negative. FU bcx done 8/24 but had to have picc placed at admission since no IV access obtainable.   Will need TEE to eval for endocarditis   Recommendations Dc vanco and zosyn Nafcillin for MSSA bacteremia Check TEE on Monday If no veg on TEE will give 2 weeks IV abx (naficillin or ancef) Would not rec dc home with PICC line so will need to stay in house for course or be dced to SNF Check HIV and Hep C Thank you very much for allowing me to participate in the care of this patient. Please call with questions.   Cheral Marker. Ola Spurr, MD

## 2016-05-08 LAB — CULTURE, BLOOD (ROUTINE X 2)

## 2016-05-08 LAB — URINE CULTURE: Culture: NO GROWTH

## 2016-05-08 NOTE — Progress Notes (Signed)
Patient ID: Randy Woods., male   DOB: 1981/07/26, 35 y.o.   MRN: [id number]  [employer] PROGRESS NOTE  Randy Woods. [id number]:[id number] DOB: 26-May-1981 DOA: 05/06/2016 [facility]: [name]  HPI/Subjective: Feeling little anxious but no other complaints  Objective: Vitals:   05/08/16 0505 05/08/16 1148  BP: 125/84 128/76  Pulse: 65 69  Resp: 20 17  Temp: 98 F (36.7 C) 98.2 F (36.8 C)    Filed Weights   05/06/16 1601 05/08/16 0421  Weight: 136.1 kg (300 lb) (!) 151.7 kg (334 lb 8 oz)    ROS: Review of Systems  Constitutional: Negative for chills and fever.  Eyes: Negative for blurred vision.  Respiratory: Negative for cough and shortness of breath.   Cardiovascular: Negative for chest pain.  Gastrointestinal: Negative for abdominal pain, constipation, diarrhea, nausea and vomiting.  Genitourinary: Negative for dysuria.  Musculoskeletal: Negative for joint pain.  Neurological: Negative for dizziness and headaches.   Exam: Physical Exam  Constitutional: He is oriented to person, place, and time.  HENT:  Nose: No mucosal edema.  Mouth/Throat: No oropharyngeal exudate or posterior oropharyngeal edema.  Eyes: Conjunctivae, EOM and lids are normal. Pupils are equal, round, and reactive to light.  Neck: No JVD present. Carotid bruit is not present. No edema present. No thyroid mass and no thyromegaly present.  Cardiovascular: S1 normal and S2 normal.  Exam reveals no gallop.   No murmur heard. Pulses:      Dorsalis pedis pulses are 2+ on the right side, and 2+ on the left side.  Respiratory: No respiratory distress. He has no wheezes. He has no rhonchi. He has no rales.  GI: Soft. Bowel sounds are normal. There is no tenderness.  Musculoskeletal:       Right ankle: He exhibits swelling.       Left ankle: He exhibits swelling.  Lymphadenopathy:    He has no cervical adenopathy.  Neurological: He is alert and oriented to person, place, and time. No  cranial nerve deficit.  Skin: Skin is warm. No rash noted. Nails show no clubbing.  Psychiatric: He has a normal mood and affect.      Data Reviewed: Basic Metabolic Panel:  Recent Labs Lab 05/05/16 0923 05/06/16 1724  NA 131* 136  K 4.2 4.3  CL 99* 105  CO2 25 24  GLUCOSE 127* 120*  BUN 12 11  CREATININE 0.83 0.66  CALCIUM 8.7* 8.3*   Liver Function Tests:  Recent Labs Lab 05/05/16 0923 05/06/16 1724  AST 40 38  ALT 47 44  ALKPHOS 54 42  BILITOT 0.7 0.7  PROT 8.0 6.8  ALBUMIN 4.1 3.5   CBC:  Recent Labs Lab 05/05/16 0923 05/06/16 1724  WBC 13.1* 8.8  NEUTROABS 10.0* 5.9  HGB 15.9 14.8  HCT 45.6 42.4  MCV 87.0 88.3  PLT 148* 118*   Cardiac Enzymes:  Recent Labs Lab 05/05/16 0923  TROPONINI <0.03     Recent Results (from the past 240 hour(s))  Blood Culture (routine x 2)     Status: Abnormal   Collection Time: 05/05/16  9:23 AM  Result Value Ref Range Status   Specimen Description BLOOD LEFT HAND  Final   Special Requests   Final    BOTTLES DRAWN AEROBIC AND ANAEROBIC AER ANA   Culture  Setup Time   Final    GRAM POSITIVE COCCI IN BOTH AEROBIC AND ANAEROBIC BOTTLES CRITICAL RESULT CALLED TO, READ BACK BY AND VERIFIED  WITH: MATT MCBANE @ 0011 ON 05/06/2016 BY CAF Organism ID to follow    Culture STAPHYLOCOCCUS AUREUS (A)  Final   Report Status 05/08/2016 FINAL  Final   Organism ID, Bacteria STAPHYLOCOCCUS AUREUS  Final      Susceptibility   Staphylococcus aureus - MIC*    CIPROFLOXACIN <=0.5 SENSITIVE Sensitive     ERYTHROMYCIN <=0.25 SENSITIVE Sensitive     GENTAMICIN <=0.5 SENSITIVE Sensitive     OXACILLIN 0.5 SENSITIVE Sensitive     TETRACYCLINE >=16 RESISTANT Resistant     VANCOMYCIN <=0.5 SENSITIVE Sensitive     TRIMETH/SULFA <=10 SENSITIVE Sensitive     CLINDAMYCIN <=0.25 SENSITIVE Sensitive     RIFAMPIN <=0.5 SENSITIVE Sensitive     Inducible Clindamycin NEGATIVE Sensitive     * STAPHYLOCOCCUS AUREUS  Blood Culture ID  Panel (Reflexed)     Status: Abnormal   Collection Time: 05/05/16  9:23 AM  Result Value Ref Range Status   Enterococcus species NOT DETECTED NOT DETECTED Final   Vancomycin resistance NOT DETECTED NOT DETECTED Final   Listeria monocytogenes NOT DETECTED NOT DETECTED Final   Staphylococcus species DETECTED (A) NOT DETECTED Final    Comment: CRITICAL RESULT CALLED TO, READ BACK BY AND VERIFIED WITH: MATT MCBANE @ 0011 ON 05/06/2016 BY CAF    Staphylococcus aureus DETECTED (A) NOT DETECTED Final    Comment: CRITICAL RESULT CALLED TO, READ BACK BY AND VERIFIED WITH: MATT MCBANE @ 0011 ON 05/06/2016 BY CAF    Methicillin resistance NOT DETECTED NOT DETECTED Final   Streptococcus species NOT DETECTED NOT DETECTED Final   Streptococcus agalactiae NOT DETECTED NOT DETECTED Final   Streptococcus pneumoniae NOT DETECTED NOT DETECTED Final   Streptococcus pyogenes NOT DETECTED NOT DETECTED Final   Acinetobacter baumannii NOT DETECTED NOT DETECTED Final   Enterobacteriaceae species NOT DETECTED NOT DETECTED Final   Enterobacter cloacae complex NOT DETECTED NOT DETECTED Final   Escherichia coli NOT DETECTED NOT DETECTED Final   Klebsiella oxytoca NOT DETECTED NOT DETECTED Final   Klebsiella pneumoniae NOT DETECTED NOT DETECTED Final   Proteus species NOT DETECTED NOT DETECTED Final   Serratia marcescens NOT DETECTED NOT DETECTED Final   Carbapenem resistance NOT DETECTED NOT DETECTED Final   Haemophilus influenzae NOT DETECTED NOT DETECTED Final   Neisseria meningitidis NOT DETECTED NOT DETECTED Final   Pseudomonas aeruginosa NOT DETECTED NOT DETECTED Final   Candida albicans NOT DETECTED NOT DETECTED Final   Candida glabrata NOT DETECTED NOT DETECTED Final   Candida krusei NOT DETECTED NOT DETECTED Final   Candida parapsilosis NOT DETECTED NOT DETECTED Final   Candida tropicalis NOT DETECTED NOT DETECTED Final  Blood Culture (routine x 2)     Status: Abnormal   Collection Time: 05/05/16  9:28  AM  Result Value Ref Range Status   Specimen Description BLOOD LEFT AC  Final   Special Requests   Final    BOTTLES DRAWN AEROBIC AND ANAEROBIC AER 3ML ANA 2ML   Culture  Setup Time   Final    GRAM POSITIVE COCCI IN BOTH AEROBIC AND ANAEROBIC BOTTLES CRITICAL RESULT CALLED TO, READ BACK BY AND VERIFIED WITH: MATT MCBANE @ 0011 ON 05/06/2016 BY CAF    Culture (A)  Final    STAPHYLOCOCCUS AUREUS SUSCEPTIBILITIES PERFORMED ON PREVIOUS CULTURE WITHIN THE LAST 5 DAYS. Performed at [facility]    Report Status 05/08/2016 FINAL  Final  Blood Culture (routine x 2)     Status: None (Preliminary result)  Collection Time: 05/06/16  5:34 PM  Result Value Ref Range Status   Specimen Description BLOOD LEFT HAND  Final   Special Requests   Final    BOTTLES DRAWN AEROBIC AND ANAEROBIC  ANA AER   Culture NO GROWTH 2 DAYS  Final   Report Status PENDING  Incomplete  Urine culture     Status: None   Collection Time: 05/06/16  6:30 PM  Result Value Ref Range Status   Specimen Description URINE, RANDOM  Final   Special Requests NONE  Final   Culture NO GROWTH Performed at [facility]   Final   Report Status 05/08/2016 FINAL  Final  Blood Culture (routine x 2)     Status: None (Preliminary result)   Collection Time: 05/06/16 11:25 PM  Result Value Ref Range Status   Specimen Description BLOOD RIGHT HAND  Final   Special Requests   Final    BOTTLES DRAWN AEROBIC AND ANAEROBIC AER ANA .   Culture NO GROWTH 1 DAY  Final   Report Status PENDING  Incomplete     Studies: Dg Chest 2 View  Result Date: 05/06/2016 CLINICAL DATA:  Sepsis, fever for 3 days EXAM: CHEST  2 VIEW COMPARISON:  05/05/2016 FINDINGS: The heart size and mediastinal contours are within normal limits. Both lungs are clear. The visualized skeletal structures are unremarkable. IMPRESSION: No active cardiopulmonary disease. Electronically Signed   By: [name] M.D.   On: 05/06/2016 16:44   Dg Chest  Portable 1 View  Result Date: 05/06/2016 CLINICAL DATA:  PICC placement. EXAM: PORTABLE CHEST 1 VIEW COMPARISON:  Frontal and lateral views earlier this day. FINDINGS: Tip of the right upper extremity PICC in the mid SVC. No pneumothorax. The cardiomediastinal contours are normal. The lungs are clear. Pulmonary vasculature is normal. No consolidation, pleural effusion, or pneumothorax. No acute osseous abnormalities are seen. IMPRESSION: Tip of the right upper extremity PICC in the mid SVC. No pneumothorax or acute abnormality. Electronically Signed   By: [name] M.D.   On: 05/06/2016 21:09    Scheduled Meds: . buprenorphine  4 mg Sublingual PC lunch  . buprenorphine  8 mg Sublingual BID  . enoxaparin (LOVENOX) injection  40 mg Subcutaneous Q12H  . nafcillin IV  2 g Intravenous Q6H  . nicotine  21 mg Transdermal Daily  . QUEtiapine  100 mg Oral QHS   Continuous Infusions: . sodium chloride 30 mL/hr at 05/07/16 1536    Assessment/Plan:  1. Sepsis Staphylococcus species. Patient with history of IVDA. TTE negative for endocarditis. Seen by Dr. [name] infectious disease and patient will need a TEE. Patient will have TEE on Monday. He will put an order for this TEE test. Longer duration if it patient has endocarditis if not will need shorter duration of IV antibiotics continue nafcillin for now- 2. Substance abuse on buprenorphine 3. Tobacco abuse on nicotine patch  Code Status:     Code Status Orders        Start     Ordered   05/06/16 2147  Full code  Continuous     05/06/16 2146    Code Status History    Date Active Date Inactive Code Status Order ID Comments User Context   This patient has a current code status but no historical code status.     Family Communication: Family at bedside Disposition Plan: Will receive IV antibiotics over the weekend TEE on Monday and then determination of plan after  that.  Consultants:  Infectious  disease  Antibiotics:  Vancomycin  Time spent: 25 minutes  Veroncia Jezek, [facility]  [employer]

## 2016-05-08 NOTE — Plan of Care (Signed)
Problem: Health Behavior/Discharge Planning: Goal: Ability to manage health-related needs will improve Outcome: Progressing Patient advised on need for continued antibiotic treatment through picc line.  Planning on placement when discharged in order to receive antibiotic treatment safely.

## 2016-05-09 LAB — BASIC METABOLIC PANEL
Anion gap: 4 — ABNORMAL LOW (ref 5–15)
BUN: 10 mg/dL (ref 6–20)
CO2: 29 mmol/L (ref 22–32)
Calcium: 8.3 mg/dL — ABNORMAL LOW (ref 8.9–10.3)
Chloride: 105 mmol/L (ref 101–111)
Creatinine, Ser: 0.7 mg/dL (ref 0.61–1.24)
GFR calc Af Amer: >60 mL/min (ref >60)
GFR calc non Af Amer: >60 mL/min (ref >60)
Glucose, Bld: 105 mg/dL — ABNORMAL HIGH (ref 65–99)
Potassium: 4.1 mmol/L (ref 3.5–5.1)
Sodium: 138 mmol/L (ref 135–145)

## 2016-05-09 LAB — HEPATITIS C ANTIBODY (REFLEX): HCV Ab: >11 s/co ratio — ABNORMAL HIGH (ref 0.0–0.9)

## 2016-05-09 LAB — COMMENT2 - HEP PANEL

## 2016-05-09 NOTE — Progress Notes (Signed)
Patient ID: Randy Woods., male   DOB: December 14, 1980, 35 y.o.   MRN: [id number]  [employer] PROGRESS NOTE  Randy Woods. [id number]:[id number] DOB: [birth year] DOA: 05/06/2016 [name]  HPI/Subjective: Denies any complaints  Objective: Vitals:   05/09/16 0407 05/09/16 1202  BP: 127/81 135/82  Pulse: 91 92  Resp: 18 20  Temp: 97.7 F (36.5 C) 98.2 F (36.8 C)    Filed Weights   05/06/16 1601 05/08/16 0421  Weight: 136.1 kg (300 lb) (!) 151.7 kg (334 lb 8 oz)    ROS: Review of Systems  Constitutional: Negative for chills and fever.  Eyes: Negative for blurred vision.  Respiratory: Negative for cough and shortness of breath.   Cardiovascular: Negative for chest pain.  Gastrointestinal: Negative for abdominal pain, constipation, diarrhea, nausea and vomiting.  Genitourinary: Negative for dysuria.  Musculoskeletal: Negative for joint pain.  Neurological: Negative for dizziness and headaches.   Exam: Physical Exam  Constitutional: He is oriented to person, place, and time.  HENT:  Nose: No mucosal edema.  Mouth/Throat: No oropharyngeal exudate or posterior oropharyngeal edema.  Eyes: Conjunctivae, EOM and lids are normal. Pupils are equal, round, and reactive to light.  Neck: No JVD present. Carotid bruit is not present. No edema present. No thyroid mass and no thyromegaly present.  Cardiovascular: S1 normal and S2 normal.  Exam reveals no gallop.   No murmur heard. Pulses:      Dorsalis pedis pulses are 2+ on the right side, and 2+ on the left side.  Respiratory: No respiratory distress. He has no wheezes. He has no rhonchi. He has no rales.  GI: Soft. Bowel sounds are normal. There is no tenderness.  Musculoskeletal:       Right ankle: He exhibits swelling.       Left ankle: He exhibits swelling.  Lymphadenopathy:    He has no cervical adenopathy.  Neurological: He is alert and oriented to person, place, and time. No cranial nerve deficit.   Skin: Skin is warm. No rash noted. Nails show no clubbing.  Psychiatric: He has a normal mood and affect.      Data Reviewed: Basic Metabolic Panel:  Recent Labs Lab 05/05/16 0923 05/06/16 1724 05/09/16 0636  NA 131* 136 138  K 4.2 4.3 4.1  CL 99* 105 105  CO2 25 24 29   GLUCOSE 127* 120* 105*  BUN 12 11 10   CREATININE 0.83 0.66 0.70  CALCIUM 8.7* 8.3* 8.3*   Liver Function Tests:  Recent Labs Lab 05/05/16 0923 05/06/16 1724  AST 40 38  ALT 47 44  ALKPHOS 54 42  BILITOT 0.7 0.7  PROT 8.0 6.8  ALBUMIN 4.1 3.5   CBC:  Recent Labs Lab 05/05/16 0923 05/06/16 1724  WBC 13.1* 8.8  NEUTROABS 10.0* 5.9  HGB 15.9 14.8  HCT 45.6 42.4  MCV 87.0 88.3  PLT 148* 118*   Cardiac Enzymes:  Recent Labs Lab 05/05/16 0923  TROPONINI <0.03     Recent Results (from the past 240 hour(s))  Blood Culture (routine x 2)     Status: Abnormal   Collection Time: 05/05/16  9:23 AM  Result Value Ref Range Status   Specimen Description BLOOD LEFT HAND  Final   Special Requests   Final    BOTTLES DRAWN AEROBIC AND ANAEROBIC AER ANA   Culture  Setup Time   Final    GRAM POSITIVE COCCI IN BOTH AEROBIC AND ANAEROBIC BOTTLES CRITICAL RESULT CALLED  TO, READ BACK BY AND VERIFIED WITH: MATT MCBANE @ 0011 ON 05/06/2016 BY CAF Organism ID to follow    Culture STAPHYLOCOCCUS AUREUS (A)  Final   Report Status 05/08/2016 FINAL  Final   Organism ID, Bacteria STAPHYLOCOCCUS AUREUS  Final      Susceptibility   Staphylococcus aureus - MIC*    CIPROFLOXACIN <=0.5 SENSITIVE Sensitive     ERYTHROMYCIN <=0.25 SENSITIVE Sensitive     GENTAMICIN <=0.5 SENSITIVE Sensitive     OXACILLIN 0.5 SENSITIVE Sensitive     TETRACYCLINE >=16 RESISTANT Resistant     VANCOMYCIN <=0.5 SENSITIVE Sensitive     TRIMETH/SULFA <=10 SENSITIVE Sensitive     CLINDAMYCIN <=0.25 SENSITIVE Sensitive     RIFAMPIN <=0.5 SENSITIVE Sensitive     Inducible Clindamycin NEGATIVE Sensitive     * STAPHYLOCOCCUS  AUREUS  Blood Culture ID Panel (Reflexed)     Status: Abnormal   Collection Time: 05/05/16  9:23 AM  Result Value Ref Range Status   Enterococcus species NOT DETECTED NOT DETECTED Final   Vancomycin resistance NOT DETECTED NOT DETECTED Final   Listeria monocytogenes NOT DETECTED NOT DETECTED Final   Staphylococcus species DETECTED (A) NOT DETECTED Final    Comment: CRITICAL RESULT CALLED TO, READ BACK BY AND VERIFIED WITH: MATT MCBANE @ 0011 ON 05/06/2016 BY CAF    Staphylococcus aureus DETECTED (A) NOT DETECTED Final    Comment: CRITICAL RESULT CALLED TO, READ BACK BY AND VERIFIED WITH: MATT MCBANE @ 0011 ON 05/06/2016 BY CAF    Methicillin resistance NOT DETECTED NOT DETECTED Final   Streptococcus species NOT DETECTED NOT DETECTED Final   Streptococcus agalactiae NOT DETECTED NOT DETECTED Final   Streptococcus pneumoniae NOT DETECTED NOT DETECTED Final   Streptococcus pyogenes NOT DETECTED NOT DETECTED Final   Acinetobacter baumannii NOT DETECTED NOT DETECTED Final   Enterobacteriaceae species NOT DETECTED NOT DETECTED Final   Enterobacter cloacae complex NOT DETECTED NOT DETECTED Final   Escherichia coli NOT DETECTED NOT DETECTED Final   Klebsiella oxytoca NOT DETECTED NOT DETECTED Final   Klebsiella pneumoniae NOT DETECTED NOT DETECTED Final   Proteus species NOT DETECTED NOT DETECTED Final   Serratia marcescens NOT DETECTED NOT DETECTED Final   Carbapenem resistance NOT DETECTED NOT DETECTED Final   Haemophilus influenzae NOT DETECTED NOT DETECTED Final   Neisseria meningitidis NOT DETECTED NOT DETECTED Final   Pseudomonas aeruginosa NOT DETECTED NOT DETECTED Final   Candida albicans NOT DETECTED NOT DETECTED Final   Candida glabrata NOT DETECTED NOT DETECTED Final   Candida krusei NOT DETECTED NOT DETECTED Final   Candida parapsilosis NOT DETECTED NOT DETECTED Final   Candida tropicalis NOT DETECTED NOT DETECTED Final  Blood Culture (routine x 2)     Status: Abnormal    Collection Time: 05/05/16  9:28 AM  Result Value Ref Range Status   Specimen Description BLOOD LEFT AC  Final   Special Requests   Final    BOTTLES DRAWN AEROBIC AND ANAEROBIC AER ANA   Culture  Setup Time   Final    GRAM POSITIVE COCCI IN BOTH AEROBIC AND ANAEROBIC BOTTLES CRITICAL RESULT CALLED TO, READ BACK BY AND VERIFIED WITH: MATT MCBANE @ 0011 ON 05/06/2016 BY CAF    Culture (A)  Final    STAPHYLOCOCCUS AUREUS SUSCEPTIBILITIES PERFORMED ON PREVIOUS CULTURE WITHIN THE LAST 5 DAYS. Performed at [facility]    Report Status 05/08/2016 FINAL  Final  Blood Culture (routine x 2)  Status: None (Preliminary result)   Collection Time: 05/06/16  5:34 PM  Result Value Ref Range Status   Specimen Description BLOOD LEFT HAND  Final   Special Requests   Final    BOTTLES DRAWN AEROBIC AND ANAEROBIC  ANA 7ML AER 3ML   Culture NO GROWTH 3 DAYS  Final   Report Status PENDING  Incomplete  Urine culture     Status: None   Collection Time: 05/06/16  6:30 PM  Result Value Ref Range Status   Specimen Description URINE, RANDOM  Final   Special Requests NONE  Final   Culture NO GROWTH Performed at [facility]   Final   Report Status 05/08/2016 FINAL  Final  Blood Culture (routine x 2)     Status: None (Preliminary result)   Collection Time: 05/06/16 11:25 PM  Result Value Ref Range Status   Specimen Description BLOOD RIGHT HAND  Final   Special Requests   Final    BOTTLES DRAWN AEROBIC AND ANAEROBIC AER 5ML ANA .5ML   Culture NO GROWTH 2 DAYS  Final   Report Status PENDING  Incomplete     Studies: No results found.  Scheduled Meds: . buprenorphine  4 mg Sublingual PC lunch  . buprenorphine  8 mg Sublingual BID  . enoxaparin (LOVENOX) injection  40 mg Subcutaneous Q12H  . nafcillin IV  2 g Intravenous Q6H  . nicotine  21 mg Transdermal Daily  . QUEtiapine  100 mg Oral QHS   Continuous Infusions:    Assessment/Plan:  1. Sepsis Staphylococcus species.  Patient with history of IVDA. TTE negative for endocarditis.TEE tomorrow. If positive for endocarditis Longer duration if it patient has endocarditis if not will need shorter duration of IV antibiotics continue nafcillin for now- 2. Substance abuse on buprenorphine 3. Tobacco abuse on nicotine patch  Code Status:     Code Status Orders        Start     Ordered   05/06/16 2147  Full code  Continuous     05/06/16 2146    Code Status History    Date Active Date Inactive Code Status Order ID Comments User Context   This patient has a current code status but no historical code status.     Family Communication: Family at bedside Disposition Plan: Will receive IV antibiotics over the weekend TEE on Monday and then determination of plan after that.  Consultants:  Infectious disease  Antibiotics:  Vancomycin  Time spent: 25 minutes  Mozell Haber, [facility]  [employer]

## 2016-05-09 NOTE — Plan of Care (Signed)
Problem: Education: Goal: Knowledge of [facility] Education information/materials will improve Outcome: Progressing Educated on need to be NPO after midnight for procedure tomorrow. Patient compliant with plan of care. Discussed discharge planning for long term antibiotic treatment and patient is aware of plan and in agreement.

## 2016-05-10 ENCOUNTER — Inpatient Hospital Stay
Admit: 2016-05-10 | Discharge: 2016-05-10 | Disposition: A | Discharge status: Home / Self Care | Payer: Medicaid Other | Attending: Cardiovascular Disease | Admitting: Cardiovascular Disease

## 2016-05-10 ENCOUNTER — Encounter
Admission: EM | Disposition: A | Discharge status: Home / Self Care | Payer: Self-pay | Source: Home / Self Care | Attending: Internal Medicine

## 2016-05-10 ENCOUNTER — Encounter: Payer: Self-pay | Admitting: Anesthesiology

## 2016-05-10 HISTORY — PX: TEE WITHOUT CARDIOVERSION: SHX5443

## 2016-05-10 SURGERY — ECHOCARDIOGRAM, TRANSESOPHAGEAL
Anesthesia: Moderate Sedation

## 2016-05-10 SURGERY — Surgical Case
Anesthesia: *Unknown

## 2016-05-10 MED ORDER — FENTANYL CITRATE (PF) 100 MCG/2ML IJ SOLN
INTRAMUSCULAR | Status: AC | PRN
  Administered 2016-05-10: 50 ug via INTRAVENOUS
  Administered 2016-05-10 (×2): 25 ug via INTRAVENOUS

## 2016-05-10 MED ORDER — SODIUM CHLORIDE 0.9 % IV SOLN
INTRAVENOUS | Status: DC
  Administered 2016-05-10: 09:00:00 via INTRAVENOUS

## 2016-05-10 MED ORDER — LIDOCAINE VISCOUS 2 % MT SOLN
OROMUCOSAL | Status: AC
  Filled 2016-05-10: qty 15

## 2016-05-10 MED ORDER — MIDAZOLAM HCL 5 MG/5ML IJ SOLN
INTRAMUSCULAR | Status: AC | PRN
  Administered 2016-05-10: 2 mg via INTRAVENOUS

## 2016-05-10 MED ORDER — LORAZEPAM 2 MG/ML IJ SOLN: INTRAMUSCULAR | Status: AC | PRN

## 2016-05-10 MED ORDER — MIDAZOLAM HCL 2 MG/2ML IJ SOLN
INTRAMUSCULAR | Status: AC | PRN
  Administered 2016-05-10 (×3): 1 mg via INTRAVENOUS
  Administered 2016-05-10: 2 mg via INTRAVENOUS

## 2016-05-10 MED ORDER — MIDAZOLAM HCL 5 MG/5ML IJ SOLN
INTRAMUSCULAR | Status: AC
  Filled 2016-05-10: qty 5

## 2016-05-10 MED ORDER — FENTANYL CITRATE (PF) 100 MCG/2ML IJ SOLN
INTRAMUSCULAR | Status: AC
  Filled 2016-05-10: qty 2

## 2016-05-10 MED ORDER — BUTAMBEN-TETRACAINE-BENZOCAINE 2-2-14 % EX AERO
INHALATION_SPRAY | CUTANEOUS | Status: AC
  Filled 2016-05-10: qty 20

## 2016-05-10 MED ORDER — MIDAZOLAM HCL 2 MG/2ML IJ SOLN
INTRAMUSCULAR | Status: AC
  Filled 2016-05-10: qty 2

## 2016-05-10 NOTE — Progress Notes (Signed)
*  PRELIMINARY RESULTS* Echocardiogram 2D Echocardiogram has been performed.  Cristela BlueHege, Rayden Scheper 05/10/2016, 9:25 AM

## 2016-05-10 NOTE — Clinical Social Work Note (Signed)
Clinical Social Work Assessment  Patient Details  Name: Randy FloodRichard L Hirano Jr. MRN: [id number] Date of Birth: 1981/04/10  Date of referral:  05/10/16               Reason for consult:  Substance Use/ETOH Abuse (Needs IV ABX for 2 Weeks)                Permission sought to share information with:  Family Supports Permission granted to share information::  Yes, Verbal Permission Granted  Name::        Agency::     Relationship::     Contact Information:     Housing/Transportation Living arrangements for the past 2 months:  Single Family Home Source of Information:  Patient (girlfriend) Patient Interpreter Needed:  None Criminal Activity/Legal Involvement Pertinent to Current Situation/Hospitalization:  No - Comment as needed Significant Relationships:  Significant Other Lives with:  Self Do you feel safe going back to the place where you live?    Need for family participation in patient care:     Care giving concerns:  none   Social Worker assessment / plan:  MD requested CSW look into the possibility of nursing home placement for IV ABX. Patient is an active IV drug user and will have a PICC line and will need to have 2 more weeks of the IV ABX. CSW spoke with patient regarding the option of pursuing nursing home and patient has declined this option. Patient and girlfriend both stated that they were told by the physician that they could stay in the hospital for the remainder of the abx. Patient and girlfriend were agitated at the thought of needing to go anywhere else for the IV ABX and that a nursing home as an option was given. CSW signing off at this time. Re-consult CSW if needed.  Employment status:    Ins[place] information[place]  [place]    Information / Referral to community resources:     Patient/Family's Response to care:  Patient and girlfriend expressed concern over an option for pursuing nursing home facility for patient for his IV  ABX.  Patient/Family's Understanding of and Emotional Response to Diagnosis, Current Treatment, and Prognosis:  Patient and girlfriend stated that they were informed that they could stay at the hospital and finish out the course of IV ABX. Physician is not wanting to send patient home with PICC line access and patient being an IV drug user.   Emotional Assessment Appearance:  Appears stated age Attitude/Demeanor/Rapport:    Affect (typically observed):  Angry, Frustrated Orientation:  Oriented to Self, Oriented to Place, Oriented to  Time, Oriented to Situation Alcohol / Substance use:  Illicit Drugs Psych involvement (Current and /or in the community):  No (Comment)  Discharge Needs  Concerns to be addressed:  Compliance Issues Concerns Readmission within the last 30 days:    Current discharge risk:  Substance Abuse Barriers to Discharge:  Active Substance Use   Randy SpanielMonica Findley Blankenbaker, LCSW 05/10/2016, 4:38 PM

## 2016-05-10 NOTE — Consult Note (Signed)
[facility] Face-to-Face Psychiatry Consult   Reason for Consult:  Consult for 35-year-old man with a history of opiate abuse and mood instability. Referring Physician:  [place] Patient Identification: Randy L Wurth Jr. MRN:  [id number] Principal Diagnosis: Bipolar 1 disorder, mixed, moderate (HCC) Diagnosis:   Patient Active Problem List   Diagnosis Date Noted  . Bipolar 1 disorder, mixed, moderate (HCC) [F31.62] 05/07/2016  . Staphylococcal sepsis (HCC) [A41.2] 05/06/2016  . Severe recurrent major depression without psychotic features (HCC) [F33.2] 05/22/2015  . Opiate abuse, continuous [F11.10] 05/22/2015  . Cocaine abuse [F14.10] 05/22/2015    Total Time spent with patient: 20 minutes  Subjective:   Randy L Katt Jr. is a 35 y.o. male patient admitted with "I know I have bipolar issues".   Follow-up for this 35-year-old man with bipolar disorder opiate dependence and treatment now for sepsis related to IV drug abuse. Patient was started back on Seroquel for bipolar disorder. On interview today he says he is feeling much better. Sleeping well at night. Denies any current suicidal thoughts homicidal thoughts or hallucinations. Affect is brighter.  HPI:  Patient interviewed. Chart reviewed. Labs reviewed. 35-year-old man with heroin dependence. In the hospital for sepsis. Being treated with IV antibiotics. Patient admits that he had gone back to using intravenous heroin recently. He says that most of the time he is maintained on buprenorphine but occasionally he will sell it if he needs the money and then relapse into heroin use which is what happened recently. Patient is feeling comfortable and not having any withdrawal symptoms now that he has been put back on his buprenorphine. He still feels that his mood is depressed and labile at times. Has a great deal of trouble sleeping. Has had passive suicidal thoughts but nothing active no intent or plan. No psychosis. No violent behavior.  Social  history: Living with his mother. Not working regularly. Occasionally gets side jobs.  Medical history: Newly diagnosed sepsis probably related to intravenous drug use. Probably going to need IV antibiotics for several weeks. History of obesity  Substance abuse history: Long-standing intravenous drug use. Cocaine is also positive although the patient absolutely denies that he's been using any cocaine recently. He has engaged in appropriate outpatient treatment.  Past Psychiatric History: History of psychiatric hospitalization. No history of current suicidal thoughts but does have a history of self injury self-mutilation and suicidal ideation. Has been on Geodon and Seroquel in the past with good response.  Risk to Self: Is patient at risk for suicide?: No Risk to Others:   Prior Inpatient Therapy:   Prior Outpatient Therapy:    Past Medical History:  Past Medical History:  Diagnosis Date  . Anxiety   . Bronchitis   . Cocaine abuse   . Depression   . Drug abuse   . Heroin abuse     Past Surgical History:  Procedure Laterality Date  . NOSE SURGERY    . TYMPANOPLASTY     Family History:  Family History  Problem Relation Age of Onset  . Diabetes Mother   . Hypertension Father   . Kidney failure Father   . Diabetes Father    Family Psychiatric  History: Bipolar disorder present in her father and sister Social History:  History  Alcohol Use  . Yes     History  Drug Use    Comment: HEROIN    Social History   Social History  . Marital status: Widowed    Spouse name: N/A  . Number   of children: N/A  . Years of education: N/A   Social History Main Topics  . Smoking status: Current Every Day Smoker  . Smokeless tobacco: Current User    Types: Snuff  . Alcohol use Yes  . Drug use:      Comment: HEROIN  . Sexual activity: Not Asked   Other Topics Concern  . None   Social History Narrative  . None   Additional Social History:    Allergies:   Allergies   Allergen Reactions  . Bactrim [Sulfamethoxazole-Trimethoprim]   . Sulfa Antibiotics     Labs:  Results for orders placed or performed during the hospital encounter of 05/06/16 (from the past 48 hour(s))  Basic metabolic panel     Status: Abnormal   Collection Time: 05/09/16  6:36 AM  Result Value Ref Range   Sodium 138 135 - 145 mmol/L   Potassium 4.1 3.5 - 5.1 mmol/L   Chloride 105 101 - 111 mmol/L   CO2 29 22 - 32 mmol/L   Glucose, Bld 105 (H) 65 - 99 mg/dL   BUN 10 6 - 20 mg/dL   Creatinine, Ser 0.70 0.61 - 1.24 mg/dL   Calcium 8.3 (L) 8.9 - 10.3 mg/dL   GFR calc non Af Amer >60 >60 mL/min   GFR calc Af Amer >60 >60 mL/min    Comment: (NOTE) The eGFR has been calculated using the CKD EPI equation. This calculation has not been validated in all clinical situations. eGFR's persistently <60 mL/min signify possible Chronic Kidney Disease.    Anion gap 4 (L) 5 - 15    Current Facility-Administered Medications  Medication Dose Route Frequency Provider Last Rate Last Dose  . acetaminophen (TYLENOL) tablet 650 mg  650 mg Oral Q6H PRN Lance Coon, MD   650 mg at 05/07/16 1538  . buprenorphine (SUBUTEX) SL tablet 4 mg  4 mg Sublingual PC lunch Loletha Grayer, MD   4 mg at 05/10/16 1417  . buprenorphine (SUBUTEX) SL tablet 8 mg  8 mg Sublingual BID Loletha Grayer, MD   8 mg at 05/10/16 1027  . butamben-tetracaine-benzocaine (CETACAINE) 10-15-12 % spray           . enoxaparin (LOVENOX) injection 40 mg  40 mg Subcutaneous Q12H Fritzi Mandes, MD   40 mg at 05/10/16 1027  . fentaNYL (SUBLIMAZE) 100 MCG/2ML injection           . lidocaine (XYLOCAINE) 2 % viscous mouth solution           . midazolam (VERSED) 2 MG/2ML injection           . midazolam (VERSED) 5 MG/5ML injection           . nafcillin 2 g in dextrose 5 % 100 mL IVPB  2 g Intravenous Q6H Sheema M Hallaji, RPH   2 g at 05/10/16 1418  . nicotine (NICODERM CQ - dosed in mg/24 hours) patch 21 mg  21 mg Transdermal Daily Lance Coon, MD   21 mg at 05/10/16 1027  . ondansetron (ZOFRAN) tablet 4 mg  4 mg Oral Q6H PRN Fritzi Mandes, MD       Or  . ondansetron (ZOFRAN) injection 4 mg  4 mg Intravenous Q6H PRN Fritzi Mandes, MD      . QUEtiapine (SEROQUEL) tablet 100 mg  100 mg Oral QHS Gonzella Lex, MD   100 mg at 05/09/16 2101  . traMADol (ULTRAM) tablet 50 mg  50 mg Oral Q6H PRN  Fritzi Mandes, MD        Musculoskeletal: Strength & Muscle Tone: within normal limits Gait & Station: normal Patient leans: N/A  Psychiatric Specialty Exam: Physical Exam  Nursing note and vitals reviewed. Constitutional: He appears well-developed and well-nourished.  HENT:  Head: Normocephalic and atraumatic.  Eyes: Conjunctivae are normal. Pupils are equal, round, and reactive to light.  Neck: Normal range of motion.  Cardiovascular: Regular rhythm and normal heart sounds.   Respiratory: Effort normal. No respiratory distress.  GI: Soft.  Musculoskeletal: Normal range of motion.  Neurological: He is alert.  Skin: Skin is warm and dry.  Psychiatric: His speech is normal and behavior is normal. Judgment normal. His affect is blunt. Cognition and memory are normal. He expresses no suicidal ideation.    Review of Systems  Constitutional: Positive for chills, fever and malaise/fatigue.  HENT: Negative.   Eyes: Negative.   Respiratory: Negative.   Cardiovascular: Negative.   Gastrointestinal: Negative.   Musculoskeletal: Negative.   Skin: Negative.   Neurological: Negative.   Psychiatric/Behavioral: Positive for depression and substance abuse. Negative for hallucinations, memory loss and suicidal ideas. The patient has insomnia. The patient is not nervous/anxious.     Blood pressure 136/90, pulse 96, temperature 98 F (36.7 C), temperature source Oral, resp. rate 18, height 5' 9" (1.753 m), weight (!) 151.7 kg (334 lb 8 oz), SpO2 98 %.Body mass index is 49.4 kg/m.  General Appearance: Fairly Groomed  Eye Contact:  Good  Speech:   Clear and Coherent  Volume:  Normal  Mood:  Anxious  Affect:  Congruent  Thought Process:  Goal Directed  Orientation:  Full (Time, Place, and Person)  Thought Content:  Logical  Suicidal Thoughts:  No  Homicidal Thoughts:  No  Memory:  Immediate;   Good Recent;   Good Remote;   Good  Judgement:  Good  Insight:  Good  Psychomotor Activity:  Normal  Concentration:  Concentration: Fair  Recall:  [employer] of Knowledge:  Fair  Language:  Fair  Akathisia:  No  Handed:  Right  AIMS (if indicated):     Assets:  Communication Skills Desire for Improvement Housing Resilience  ADL's:  Intact  Cognition:  WNL  Sleep:        Treatment Plan Summary: Daily contact with patient to assess and evaluate symptoms and progress in treatment, Medication management and Plan Continue current medication. Supportive counseling and review of treatment goals. I will follow-up as needed.  Disposition: Patient does not meet criteria for psychiatric inpatient admission. Supportive therapy provided about ongoing stressors.  Alethia Berthold, MD 05/10/2016 7:55 PM

## 2016-05-10 NOTE — Progress Notes (Signed)
Randy FloodRichard L Bray Jr. is a 35 y.o. male  [id number]  Primary Cardiologist: shaukat khan     Reason for Consultation: endocarditis HPI: [id number] [id number] with blood cultures positive and needs endocarditis ruled out by TEE.   Review of Systems: unremarkable   Past Medical History:  Diagnosis Date  . Anxiety   . Bronchitis   . Cocaine abuse   . Depression   . Drug abuse   . Heroin abuse     Medications Prior to Admission  Medication Sig Dispense Refill  . buprenorphine-naloxone (SUBOXONE) 8-2 MG SUBL SL tablet Place 1 tablet under the tongue 2 (two) times daily.    Marland Kitchen. ibuprofen (ADVIL,MOTRIN) 200 MG tablet Take 600 mg by mouth 2 (two) times daily.    Marland Kitchen. amoxicillin (AMOXIL) 500 MG capsule Take 1 capsule (500 mg total) by mouth 3 (three) times daily. (Patient not taking: Reported on 05/06/2016) 30 capsule 0  . clindamycin (CLEOCIN) 300 MG capsule Take 1 capsule (300 mg total) by mouth 3 (three) times daily. (Patient not taking: Reported on 05/06/2016) 30 capsule 0  . predniSONE (DELTASONE) 10 MG tablet Take 2 tablets (20 mg total) by mouth daily with breakfast. (Patient not taking: Reported on 05/06/2016) 10 tablet 0  . QUEtiapine (SEROQUEL) 100 MG tablet Take 1 tablet (100 mg total) by mouth at bedtime. (Patient not taking: Reported on 05/06/2016) 30 tablet 0  . ziprasidone (GEODON) 40 MG capsule Take 1 capsule (40 mg total) by mouth 2 (two) times daily with a meal. (Patient not taking: Reported on 05/06/2016) 60 capsule 0     . [MAR Hold] buprenorphine  4 mg Sublingual PC lunch  . [MAR Hold] buprenorphine  8 mg Sublingual BID  . butamben-tetracaine-benzocaine      . [MAR Hold] enoxaparin (LOVENOX) injection  40 mg Subcutaneous Q12H  . fentaNYL      . lidocaine      . midazolam      . midazolam      . [MAR Hold] nafcillin IV  2 g Intravenous Q6H  . [MAR Hold] nicotine  21 mg Transdermal Daily  . [MAR Hold] QUEtiapine  100 mg Oral QHS    Infusions: . sodium chloride 20 mL/hr at 05/10/16  0848    Allergies  Allergen Reactions  . Bactrim [Sulfamethoxazole-Trimethoprim]   . Sulfa Antibiotics     Social History   Social History  . Marital status: Widowed    Spouse name: N/A  . Number of children: N/A  . Years of education: N/A   Occupational History  . Not on file.   Social History Main Topics  . Smoking status: Current Every Day Smoker  . Smokeless tobacco: Current User    Types: Snuff  . Alcohol use Yes  . Drug use:      Comment: HEROIN  . Sexual activity: Not on file   Other Topics Concern  . Not on file   Social History Narrative  . No narrative on file    Family History  Problem Relation Age of Onset  . Diabetes Mother   . Hypertension Father   . Kidney failure Father   . Diabetes Father     PHYSICAL EXAM: Vitals:   05/10/16 0920 05/10/16 0930  BP: (!) 143/101 (!) 143/82  Pulse: (!) 104 95  Resp: 18 12  Temp:       Intake/Output Summary (Last 24 hours) at 05/10/16 0954 Last data filed at 05/09/16 1600  Gross per 24 hour  Intake              200 ml  Output              350 ml  Net             -150 ml    General:  Well appearing. No respiratory difficulty HEENT: normal Neck: supple. no JVD. Carotids 2+ bilat; no bruits. No lymphadenopathy or thryomegaly appreciated. Cor: PMI nondisplaced. Regular rate & rhythm. No rubs, gallops or murmurs. Lungs: clear Abdomen: soft, nontender, nondistended. No hepatosplenomegaly. No bruits or masses. Good bowel sounds. Extremities: no cyanosis, clubbing, rash, edema Neuro: alert & oriented x 3, cranial nerves grossly intact. moves all 4 extremities w/o difficulty. Affect pleasant.    No results found for this or any previous visit (from the past 24 hour(s)). No results found.   ASSESSMENT AND PLAN: R/o Endocarditis by doing TEE. TEE was done which showed normal EF, mild MR and no vegetation or thrombi seen.  KHAN,SHAUKAT A

## 2016-05-10 NOTE — Progress Notes (Addendum)
Patient ID: Randy Floodichard L Labrosse Jr., male   DOB: [birth year], 35 y.o.   MRN: [id number]  [employer] PROGRESS NOTE  Randy Floodichard L Petrovic Jr. WUJ:[id number] DOB: [birth year] DOA: 05/06/2016 PCP: [facility]  HPI/Subjective: Denies any complaints patient had a TEE which was negative  Objective: Vitals:   05/10/16 0930 05/10/16 0955  BP: (!) 143/82 133/78  Pulse: 95 99  Resp: 12 20  Temp:  97.8 F (36.6 C)    Filed Weights   05/06/16 1601 05/08/16 0421  Weight: 136.1 kg (300 lb) (!) 151.7 kg (334 lb 8 oz)    ROS: Review of Systems  Constitutional: Negative for chills and fever.  Eyes: Negative for blurred vision.  Respiratory: Negative for cough and shortness of breath.   Cardiovascular: Negative for chest pain.  Gastrointestinal: Negative for abdominal pain, constipation, diarrhea, nausea and vomiting.  Genitourinary: Negative for dysuria.  Musculoskeletal: Negative for joint pain.  Neurological: Negative for dizziness and headaches.   Exam: Physical Exam  Constitutional: He is oriented to person, place, and time.  HENT:  Nose: No mucosal edema.  Mouth/Throat: No oropharyngeal exudate or posterior oropharyngeal edema.  Eyes: Conjunctivae, EOM and lids are normal. Pupils are equal, round, and reactive to light.  Neck: No JVD present. Carotid bruit is not present. No edema present. No thyroid mass and no thyromegaly present.  Cardiovascular: S1 normal and S2 normal.  Exam reveals no gallop.   No murmur heard. Pulses:      Dorsalis pedis pulses are 2+ on the right side, and 2+ on the left side.  Respiratory: No respiratory distress. He has no wheezes. He has no rhonchi. He has no rales.  GI: Soft. Bowel sounds are normal. There is no tenderness.  Musculoskeletal:       Right ankle: He exhibits swelling.       Left ankle: He exhibits swelling.  Lymphadenopathy:    He has no cervical adenopathy.  Neurological: He is alert and oriented to person, place, and time. No  cranial nerve deficit.  Skin: Skin is warm. No rash noted. Nails show no clubbing.  Psychiatric: He has a normal mood and affect.      Data Reviewed: Basic Metabolic Panel:  Recent Labs Lab 05/05/16 0923 05/06/16 1724 05/09/16 0636  NA 131* 136 138  K 4.2 4.3 4.1  CL 99* 105 105  CO2 25 24 29   GLUCOSE 127* 120* 105*  BUN 12 11 10   CREATININE 0.83 0.66 0.70  CALCIUM 8.7* 8.3* 8.3*   Liver Function Tests:  Recent Labs Lab 05/05/16 0923 05/06/16 1724  AST 40 38  ALT 47 44  ALKPHOS 54 42  BILITOT 0.7 0.7  PROT 8.0 6.8  ALBUMIN 4.1 3.5   CBC:  Recent Labs Lab 05/05/16 0923 05/06/16 1724  WBC 13.1* 8.8  NEUTROABS 10.0* 5.9  HGB 15.9 14.8  HCT 45.6 42.4  MCV 87.0 88.3  PLT 148* 118*   Cardiac Enzymes:  Recent Labs Lab 05/05/16 0923  TROPONINI <0.03     Recent Results (from the past 240 hour(s))  Blood Culture (routine x 2)     Status: Abnormal   Collection Time: 05/05/16  9:23 AM  Result Value Ref Range Status   Specimen Description BLOOD LEFT HAND  Final   Special Requests   Final    BOTTLES DRAWN AEROBIC AND ANAEROBIC AER 5ML ANA 9ML   Culture  Setup Time   Final    GRAM POSITIVE COCCI IN BOTH AEROBIC AND  ANAEROBIC BOTTLES CRITICAL RESULT CALLED TO, READ BACK BY AND VERIFIED WITH: MATT MCBANE @ 0011 ON 05/06/2016 BY CAF Organism ID to follow    Culture STAPHYLOCOCCUS AUREUS (A)  Final   Report Status 05/08/2016 FINAL  Final   Organism ID, Bacteria STAPHYLOCOCCUS AUREUS  Final      Susceptibility   Staphylococcus aureus - MIC*    CIPROFLOXACIN <=0.5 SENSITIVE Sensitive     ERYTHROMYCIN <=0.25 SENSITIVE Sensitive     GENTAMICIN <=0.5 SENSITIVE Sensitive     OXACILLIN 0.5 SENSITIVE Sensitive     TETRACYCLINE >=16 RESISTANT Resistant     VANCOMYCIN <=0.5 SENSITIVE Sensitive     TRIMETH/SULFA <=10 SENSITIVE Sensitive     CLINDAMYCIN <=0.25 SENSITIVE Sensitive     RIFAMPIN <=0.5 SENSITIVE Sensitive     Inducible Clindamycin NEGATIVE Sensitive      * STAPHYLOCOCCUS AUREUS  Blood Culture ID Panel (Reflexed)     Status: Abnormal   Collection Time: 05/05/16  9:23 AM  Result Value Ref Range Status   Enterococcus species NOT DETECTED NOT DETECTED Final   Vancomycin resistance NOT DETECTED NOT DETECTED Final   Listeria monocytogenes NOT DETECTED NOT DETECTED Final   Staphylococcus species DETECTED (A) NOT DETECTED Final    Comment: CRITICAL RESULT CALLED TO, READ BACK BY AND VERIFIED WITH: MATT MCBANE @ 0011 ON 05/06/2016 BY CAF    Staphylococcus aureus DETECTED (A) NOT DETECTED Final    Comment: CRITICAL RESULT CALLED TO, READ BACK BY AND VERIFIED WITH: MATT MCBANE @ 0011 ON 05/06/2016 BY CAF    Methicillin resistance NOT DETECTED NOT DETECTED Final   Streptococcus species NOT DETECTED NOT DETECTED Final   Streptococcus agalactiae NOT DETECTED NOT DETECTED Final   Streptococcus pneumoniae NOT DETECTED NOT DETECTED Final   Streptococcus pyogenes NOT DETECTED NOT DETECTED Final   Acinetobacter baumannii NOT DETECTED NOT DETECTED Final   Enterobacteriaceae species NOT DETECTED NOT DETECTED Final   Enterobacter cloacae complex NOT DETECTED NOT DETECTED Final   Escherichia coli NOT DETECTED NOT DETECTED Final   Klebsiella oxytoca NOT DETECTED NOT DETECTED Final   Klebsiella pneumoniae NOT DETECTED NOT DETECTED Final   Proteus species NOT DETECTED NOT DETECTED Final   Serratia marcescens NOT DETECTED NOT DETECTED Final   Carbapenem resistance NOT DETECTED NOT DETECTED Final   Haemophilus influenzae NOT DETECTED NOT DETECTED Final   Neisseria meningitidis NOT DETECTED NOT DETECTED Final   Pseudomonas aeruginosa NOT DETECTED NOT DETECTED Final   Candida albicans NOT DETECTED NOT DETECTED Final   Candida glabrata NOT DETECTED NOT DETECTED Final   Candida krusei NOT DETECTED NOT DETECTED Final   Candida parapsilosis NOT DETECTED NOT DETECTED Final   Candida tropicalis NOT DETECTED NOT DETECTED Final  Blood Culture (routine x 2)      Status: Abnormal   Collection Time: 05/05/16  9:28 AM  Result Value Ref Range Status   Specimen Description BLOOD LEFT AC  Final   Special Requests   Final    BOTTLES DRAWN AEROBIC AND ANAEROBIC AER ANA   Culture  Setup Time   Final    GRAM POSITIVE COCCI IN BOTH AEROBIC AND ANAEROBIC BOTTLES CRITICAL RESULT CALLED TO, READ BACK BY AND VERIFIED WITH: MATT MCBANE @ 0011 ON 05/06/2016 BY CAF    Culture (A)  Final    STAPHYLOCOCCUS AUREUS SUSCEPTIBILITIES PERFORMED ON PREVIOUS CULTURE WITHIN THE LAST 5 DAYS. Performed at [facility]    Report Status 05/08/2016 FINAL  Final  Blood Culture (routine x  2)     Status: None (Preliminary result)   Collection Time: 05/06/16  5:34 PM  Result Value Ref Range Status   Specimen Description BLOOD LEFT HAND  Final   Special Requests   Final    BOTTLES DRAWN AEROBIC AND ANAEROBIC  ANA AER   Culture NO GROWTH 4 DAYS  Final   Report Status PENDING  Incomplete  Urine culture     Status: None   Collection Time: 05/06/16  6:30 PM  Result Value Ref Range Status   Specimen Description URINE, RANDOM  Final   Special Requests NONE  Final   Culture NO GROWTH Performed at [facility]   Final   Report Status 05/08/2016 FINAL  Final  Blood Culture (routine x 2)     Status: None (Preliminary result)   Collection Time: 05/06/16 11:25 PM  Result Value Ref Range Status   Specimen Description BLOOD RIGHT HAND  Final   Special Requests   Final    BOTTLES DRAWN AEROBIC AND ANAEROBIC AER ANA .   Culture NO GROWTH 3 DAYS  Final   Report Status PENDING  Incomplete     Studies: No results found.  Scheduled Meds: . buprenorphine  4 mg Sublingual PC lunch  . buprenorphine  8 mg Sublingual BID  . butamben-tetracaine-benzocaine      . enoxaparin (LOVENOX) injection  40 mg Subcutaneous Q12H  . fentaNYL      . lidocaine      . midazolam      . midazolam      . nafcillin IV  2 g Intravenous Q6H  . nicotine  21 mg  Transdermal Daily  . QUEtiapine  100 mg Oral QHS   Continuous Infusions:    Assessment/Plan:  1. Sepsis Staphylococcus species. Patient with history of IVDA. TTE negative for endocarditis.Patient will need 2 weeks of IV antibiotics from the time the last blood cultures were negative. I have discussed the case with Dr. [name] who states that patient should not be discharged home with the PICC line 2. Substance abuse on buprenorphine 3. Tobacco abuse on nicotine patch  Code Status:     Code Status Orders        Start     Ordered   05/06/16 2147  Full code  Continuous     05/06/16 2146    Code Status History    Date Active Date Inactive Code Status Order ID Comments User Context   This patient has a current code status but no historical code status.     Family Communication: Family at bedside Disposition Plan: Will receive IV antibiotics  In hospital  Consultants:  Infectious disease  Antibiotics:  Vancomycin  Time spent: 25 minutes  Glynn Freas, [facility]  [employer]

## 2016-05-10 NOTE — Progress Notes (Signed)
[facility] Date of Admission:  05/06/2016     ID: Randy Floodichard L Homan Jr. is a 35 y.o. male with  MRSA bacteremia, IVDU Principal Problem:   Bipolar 1 disorder, mixed, moderate (HCC) Active Problems:   Opiate abuse, continuous   Staphylococcal sepsis (HCC)   Subjective: No fevers, TEE neg  ROS  Eleven systems are reviewed and negative except per hpi  Medications:  Antibiotics Given (last 72 hours)    Date/Time Action Medication Dose Rate   05/07/16 1808 Given   vancomycin (VANCOCIN) 1,500 mg in sodium chloride 0.9 % 500 mL IVPB 1,500 mg 250 mL/hr   05/07/16 2117 Given   nafcillin 2 g in dextrose 5 % 100 mL IVPB 2 g 200 mL/hr   05/08/16 0305 Given   nafcillin 2 g in dextrose 5 % 100 mL IVPB 2 g 200 mL/hr   05/08/16 1004 Given   nafcillin 2 g in dextrose 5 % 100 mL IVPB 2 g 200 mL/hr   05/08/16 1431 Given   nafcillin 2 g in dextrose 5 % 100 mL IVPB 2 g 200 mL/hr   05/08/16 2133 Given   nafcillin 2 g in dextrose 5 % 100 mL IVPB 2 g 200 mL/hr   05/09/16 0331 Given   nafcillin 2 g in dextrose 5 % 100 mL IVPB 2 g 200 mL/hr   05/09/16 [id number] Given   nafcillin 2 g in dextrose 5 % 100 mL IVPB 2 g 200 mL/hr   05/09/16 1500 Given   nafcillin 2 g in dextrose 5 % 100 mL IVPB 2 g 200 mL/hr   05/09/16 2051 Given   nafcillin 2 g in dextrose 5 % 100 mL IVPB 2 g 200 mL/hr   05/10/16 0320 Given   nafcillin 2 g in dextrose 5 % 100 mL IVPB 2 g 200 mL/hr   05/10/16 1418 Given   nafcillin 2 g in dextrose 5 % 100 mL IVPB 2 g 200 mL/hr     . buprenorphine  4 mg Sublingual PC lunch  . buprenorphine  8 mg Sublingual BID  . butamben-tetracaine-benzocaine      . enoxaparin (LOVENOX) injection  40 mg Subcutaneous Q12H  . fentaNYL      . lidocaine      . midazolam      . midazolam      . nafcillin IV  2 g Intravenous Q6H  . nicotine  21 mg Transdermal Daily  . QUEtiapine  100 mg Oral QHS    Objective: Vital signs in last 24 hours: Temp:  [97.6 F (36.4  C)-98.7 F (37.1 C)] 98 F (36.7 C) (08/28 1341) Pulse Rate:  [87-106] 96 (08/28 1341) Resp:  [12-25] 18 (08/28 1341) BP: (125-158)/(78-105) 136/90 (08/28 1341) SpO2:  [94 %-100 %] 98 % (08/28 1341) Constitutional: He is oriented to person, place, and time. Obese HENT: anicteric,  Mouth/Throat: Oropharynx is clear and moist. No oropharyngeal exudate.  Cardiovascular: Normal rate, regular rhythm and normal heart sounds. No murmur heard.  Pulmonary/Chest: Effort normal and breath sounds normal. No respiratory distress. He has no wheezes.  Abdominal: Soft. Bowel sounds are normal. He exhibits no distension. There is no tenderness.  Lymphadenopathy: no cervical adenopathy.  Neurological: He is alert and oriented to person, place, and time.  Skin: Skin is warm and dry. No rash noted. No erythema. L forearm with small scab Psychiatric: He has a normal mood and affect. His behavior is normal.   Lab Results  Recent Labs  05/09/16 0636  NA 138  K 4.1  CL 105  CO2 29  BUN 10  CREATININE 0.70    Microbiology: Results for orders placed or performed during the hospital encounter of 05/06/16  Blood Culture (routine x 2)     Status: None (Preliminary result)   Collection Time: 05/06/16  5:34 PM  Result Value Ref Range Status   Specimen Description BLOOD LEFT HAND  Final   Special Requests   Final    BOTTLES DRAWN AEROBIC AND ANAEROBIC  ANA AER   Culture NO GROWTH 4 DAYS  Final   Report Status PENDING  Incomplete  Urine culture     Status: None   Collection Time: 05/06/16  6:30 PM  Result Value Ref Range Status   Specimen Description URINE, RANDOM  Final   Special Requests NONE  Final   Culture NO GROWTH Performed at [facility]   Final   Report Status 05/08/2016 FINAL  Final  Blood Culture (routine x 2)     Status: None (Preliminary result)   Collection Time: 05/06/16 11:25 PM  Result Value Ref Range Status   Specimen Description BLOOD RIGHT HAND  Final    Special Requests   Final    BOTTLES DRAWN AEROBIC AND ANAEROBIC AER ANA .   Culture NO GROWTH 3 DAYS  Final   Report Status PENDING  Incomplete    Studies/Results: Dg Chest 2 View  Result Date: 05/06/2016 CLINICAL DATA:  Sepsis, fever for 3 days EXAM: CHEST  2 VIEW COMPARISON:  05/05/2016 FINDINGS: The heart size and mediastinal contours are within normal limits. Both lungs are clear. The visualized skeletal structures are unremarkable. IMPRESSION: No active cardiopulmonary disease. Electronically Signed   By: [name] M.D.   On: 05/06/2016 16:44   Dg Chest Portable 1 View  Result Date: 05/06/2016 CLINICAL DATA:  PICC placement. EXAM: PORTABLE CHEST 1 VIEW COMPARISON:  Frontal and lateral views earlier this day. FINDINGS: Tip of the right upper extremity PICC in the mid SVC. No pneumothorax. The cardiomediastinal contours are normal. The lungs are clear. Pulmonary vasculature is normal. No consolidation, pleural effusion, or pneumothorax. No acute osseous abnormalities are seen. IMPRESSION: Tip of the right upper extremity PICC in the mid SVC. No pneumothorax or acute abnormality. Electronically Signed   By: [name] M.D.   On: 05/06/2016 21:09   Dg Chest Port 1 View  Result Date: 05/05/2016 CLINICAL DATA:  Fever with chest pain and shortness of breath EXAM: PORTABLE CHEST 1 VIEW COMPARISON:  May 31, 2015 FINDINGS: Lungs are clear. The heart size and pulmonary vascularity are normal. No adenopathy. No bone lesions. IMPRESSION: No edema or consolidation. Electronically Signed   By: [name] M.D.   On: 05/05/2016 10:06    Assessment/Plan: Randy Germond. is a 35 y.o. male with hx IVDU now with staph aureus  (MSSA) bacteremia . TTE negative. FU bcx done 8/24 but had to have picc placed at admission since no IV access obtainable.   TEE neg  for endocarditis  Hep C + HIV negative Recommendations Cont  Nafcillin for MSSA bacteremia  Will need 2 weeks IV abx  (naficillin or ancef) Would NOT rec dc home with PICC line given very high risk of IVDU through the PICC and risk of infection -  so will need to stay in house for course or be dced to SNF  Hep C + - will check viral load  Thank you very much for the consult. Will follow with you.  Kaetlyn Noa P   05/10/2016, 3:50 PM

## 2016-05-11 ENCOUNTER — Encounter: Payer: Self-pay | Admitting: Cardiovascular Disease

## 2016-05-11 LAB — CULTURE, BLOOD (ROUTINE X 2): Culture: NO GROWTH

## 2016-05-11 LAB — CBC
HCT: 42.6 % (ref 40.0–52.0)
Hemoglobin: 15.2 g/dL (ref 13.0–18.0)
MCH: 31.1 pg (ref 26.0–34.0)
MCHC: 35.6 g/dL (ref 32.0–36.0)
MCV: 87.3 fL (ref 80.0–100.0)
Platelets: 161 10*3/uL (ref 150–440)
RBC: 4.88 MIL/uL (ref 4.40–5.90)
RDW: 12.8 % (ref 11.5–14.5)
WBC: 9.8 10*3/uL (ref 3.8–10.6)

## 2016-05-11 MED ORDER — NAFCILLIN SODIUM 2 G IJ SOLR
2.0000 g | INTRAVENOUS | Status: AC
  Administered 2016-05-11 – 2016-05-20 (×57): 2 g via INTRAVENOUS
  Filled 2016-05-11 (×58): qty 2000

## 2016-05-11 NOTE — Progress Notes (Signed)
Pharmacy Antibiotic Note  Randy FloodRichard L Pinnix Jr. is a 35 y.o. male admitted on 05/06/2016 with MSSA bacteremia.    Pharmacy has been consulted for nafcillin dosing.  This is day #7 of antibiotics. ID is following  Plan: Patient is currently ordered nafcillin 2 g IV q6h. Will change to nafcillin 2 g IV q4h.  Height: 5\' 9"  (175.3 cm) Weight: (!) 334 lb 8 oz (151.7 kg) IBW/kg (Calculated) : 70.7  Temp (24hrs), Avg:97.9 F (36.6 C), Min:97.7 F (36.5 C), Max:98 F (36.7 C)   Recent Labs Lab 05/05/16 0923 05/05/16 0924 05/06/16 1724 05/06/16 1734 05/06/16 2302 05/09/16 0636 05/11/16 0506  WBC 13.1*  --  8.8  --   --   --  9.8  CREATININE 0.83  --  0.66  --   --  0.70  --   LATICACIDVEN  --  1.6  --  1.5 1.1  --   --     Estimated Creatinine Clearance: 186.2 mL/min (by C-G formula based on SCr of 0.8 mg/dL).    Allergies  Allergen Reactions  . Bactrim [Sulfamethoxazole-Trimethoprim]   . Sulfa Antibiotics     Antimicrobials this admission: 8/24 Vancomycin  >> 8/25 8/25 Nafcillin  >>    Microbiology results: 8/23 BCx: MSSA  Thank you for allowing pharmacy to be a part of this patient's care.  Cindi CarbonMary M Lyrical Sowle, PharmD, BCPS Clinical Pharmacist 05/11/2016 10:17 AM

## 2016-05-11 NOTE — Progress Notes (Addendum)
Patient ID: Randy Flood., male   DOB: 1980-12-09, 35 y.o.   MRN: [id number]  [employer] PROGRESS NOTE  Randy Flood. WUJ:[id number] DOB: 1980/09/25 DOA: 05/06/2016 PCP: [name]  HPI/Subjective: No complaints case discussed with Dr. [name] geriatrics today he recommends patient stay in the house because she is not willing to go to a nursing home  Objective: Vitals:   05/10/16 1341 05/11/16 0455  BP: 136/90 112/65  Pulse: 96 86  Resp: 18 20  Temp: 98 F (36.7 C) 97.7 F (36.5 C)    Filed Weights   05/06/16 1601 05/08/16 0421  Weight: 136.1 kg (300 lb) (!) 151.7 kg (334 lb 8 oz)    ROS: Review of Systems  Constitutional: Negative for chills and fever.  Eyes: Negative for blurred vision.  Respiratory: Negative for cough and shortness of breath.   Cardiovascular: Negative for chest pain.  Gastrointestinal: Negative for abdominal pain, constipation, diarrhea, nausea and vomiting.  Genitourinary: Negative for dysuria.  Musculoskeletal: Negative for joint pain.  Neurological: Negative for dizziness and headaches.   Exam: Physical Exam  Constitutional: He is oriented to person, place, and time.  HENT:  Nose: No mucosal edema.  Mouth/Throat: No oropharyngeal exudate or posterior oropharyngeal edema.  Eyes: Conjunctivae, EOM and lids are normal. Pupils are equal, round, and reactive to light.  Neck: No JVD present. Carotid bruit is not present. No edema present. No thyroid mass and no thyromegaly present.  Cardiovascular: S1 normal and S2 normal.  Exam reveals no gallop.   No murmur heard. Pulses:      Dorsalis pedis pulses are 2+ on the right side, and 2+ on the left side.  Respiratory: No respiratory distress. He has no wheezes. He has no rhonchi. He has no rales.  GI: Soft. Bowel sounds are normal. There is no tenderness.  Musculoskeletal:       Right ankle: He exhibits swelling.       Left ankle: He exhibits swelling.  Lymphadenopathy:    He has no cervical adenopathy.  Neurological: He is alert and oriented to person, place, and time. No cranial nerve deficit.  Skin: Skin is warm. No rash noted. Nails show no clubbing.  Psychiatric: He has a normal mood and affect.      Data Reviewed: Basic Metabolic Panel:  Recent Labs Lab 05/05/16 0923 05/06/16 1724 05/09/16 0636  NA 131* 136 138  K 4.2 4.3 4.1  CL 99* 105 105  CO2 25 24 29   GLUCOSE 127* 120* 105*  BUN 12 11 10   CREATININE 0.83 0.66 0.70  CALCIUM 8.7* 8.3* 8.3*   Liver Function Tests:  Recent Labs Lab 05/05/16 0923 05/06/16 1724  AST 40 38  ALT 47 44  ALKPHOS 54 42  BILITOT 0.7 0.7  PROT 8.0 6.8  ALBUMIN 4.1 3.5   CBC:  Recent Labs Lab 05/05/16 0923 05/06/16 1724 05/11/16 0506  WBC 13.1* 8.8 9.8  NEUTROABS 10.0* 5.9  --   HGB 15.9 14.8 15.2  HCT 45.6 42.4 42.6  MCV 87.0 88.3 87.3  PLT 148* 118* 161   Cardiac Enzymes:  Recent Labs Lab 05/05/16 0923  TROPONINI <0.03     Recent Results (from the past 240 hour(s))  Blood Culture (routine x 2)     Status: Abnormal   Collection Time: 05/05/16  9:23 AM  Result Value Ref Range Status   Specimen Description BLOOD LEFT HAND  Final   Special Requests   Final  BOTTLES DRAWN AEROBIC AND ANAEROBIC AER ANA   Culture  Setup Time   Final    GRAM POSITIVE COCCI IN BOTH AEROBIC AND ANAEROBIC BOTTLES CRITICAL RESULT CALLED TO, READ BACK BY AND VERIFIED WITH: MATT MCBANE @ 0011 ON 05/06/2016 BY CAF Organism ID to follow    Culture STAPHYLOCOCCUS AUREUS (A)  Final   Report Status 05/08/2016 FINAL  Final   Organism ID, Bacteria STAPHYLOCOCCUS AUREUS  Final      Susceptibility   Staphylococcus aureus - MIC*    CIPROFLOXACIN <=0.5 SENSITIVE Sensitive     ERYTHROMYCIN <=0.25 SENSITIVE Sensitive     GENTAMICIN <=0.5 SENSITIVE Sensitive     OXACILLIN 0.5 SENSITIVE Sensitive     TETRACYCLINE >=16 RESISTANT Resistant     VANCOMYCIN <=0.5 SENSITIVE Sensitive     TRIMETH/SULFA <=10  SENSITIVE Sensitive     CLINDAMYCIN <=0.25 SENSITIVE Sensitive     RIFAMPIN <=0.5 SENSITIVE Sensitive     Inducible Clindamycin NEGATIVE Sensitive     * STAPHYLOCOCCUS AUREUS  Blood Culture ID Panel (Reflexed)     Status: Abnormal   Collection Time: 05/05/16  9:23 AM  Result Value Ref Range Status   Enterococcus species NOT DETECTED NOT DETECTED Final   Vancomycin resistance NOT DETECTED NOT DETECTED Final   Listeria monocytogenes NOT DETECTED NOT DETECTED Final   Staphylococcus species DETECTED (A) NOT DETECTED Final    Comment: CRITICAL RESULT CALLED TO, READ BACK BY AND VERIFIED WITH: MATT MCBANE @ 0011 ON 05/06/2016 BY CAF    Staphylococcus aureus DETECTED (A) NOT DETECTED Final    Comment: CRITICAL RESULT CALLED TO, READ BACK BY AND VERIFIED WITH: MATT MCBANE @ 0011 ON 05/06/2016 BY CAF    Methicillin resistance NOT DETECTED NOT DETECTED Final   Streptococcus species NOT DETECTED NOT DETECTED Final   Streptococcus agalactiae NOT DETECTED NOT DETECTED Final   Streptococcus pneumoniae NOT DETECTED NOT DETECTED Final   Streptococcus pyogenes NOT DETECTED NOT DETECTED Final   Acinetobacter baumannii NOT DETECTED NOT DETECTED Final   Enterobacteriaceae species NOT DETECTED NOT DETECTED Final   Enterobacter cloacae complex NOT DETECTED NOT DETECTED Final   Escherichia coli NOT DETECTED NOT DETECTED Final   Klebsiella oxytoca NOT DETECTED NOT DETECTED Final   Klebsiella pneumoniae NOT DETECTED NOT DETECTED Final   Proteus species NOT DETECTED NOT DETECTED Final   Serratia marcescens NOT DETECTED NOT DETECTED Final   Carbapenem resistance NOT DETECTED NOT DETECTED Final   Haemophilus influenzae NOT DETECTED NOT DETECTED Final   Neisseria meningitidis NOT DETECTED NOT DETECTED Final   Pseudomonas aeruginosa NOT DETECTED NOT DETECTED Final   Candida albicans NOT DETECTED NOT DETECTED Final   Candida glabrata NOT DETECTED NOT DETECTED Final   Candida krusei NOT DETECTED NOT DETECTED  Final   Candida parapsilosis NOT DETECTED NOT DETECTED Final   Candida tropicalis NOT DETECTED NOT DETECTED Final  Blood Culture (routine x 2)     Status: Abnormal   Collection Time: 05/05/16  9:28 AM  Result Value Ref Range Status   Specimen Description BLOOD LEFT AC  Final   Special Requests   Final    BOTTLES DRAWN AEROBIC AND ANAEROBIC AER ANA   Culture  Setup Time   Final    GRAM POSITIVE COCCI IN BOTH AEROBIC AND ANAEROBIC BOTTLES CRITICAL RESULT CALLED TO, READ BACK BY AND VERIFIED WITH: MATT MCBANE @ 0011 ON 05/06/2016 BY CAF    Culture (A)  Final    STAPHYLOCOCCUS AUREUS SUSCEPTIBILITIES  PERFORMED ON PREVIOUS CULTURE WITHIN THE LAST 5 DAYS. [facility]    Report Status 05/08/2016 FINAL  Final  Blood Culture (routine x 2)     Status: None   Collection Time: 05/06/16  5:34 PM  Result Value Ref Range Status   Specimen Description BLOOD LEFT HAND  Final   Special Requests   Final    BOTTLES DRAWN AEROBIC AND ANAEROBIC  ANA 7ML AER 3ML   Culture NO GROWTH 5 DAYS  Final   Report Status 05/11/2016 FINAL  Final  Urine culture     Status: None   Collection Time: 05/06/16  6:30 PM  Result Value Ref Range Status   Specimen Description URINE, RANDOM  Final   Special Requests NONE  Final   Culture NO GROWTH [facility]   Final   Report Status 05/08/2016 FINAL  Final  Blood Culture (routine x 2)     Status: None (Preliminary result)   Collection Time: 05/06/16 11:25 PM  Result Value Ref Range Status   Specimen Description BLOOD RIGHT HAND  Final   Special Requests   Final    BOTTLES DRAWN AEROBIC AND ANAEROBIC AER 5ML ANA .5ML   Culture NO GROWTH 4 DAYS  Final   Report Status PENDING  Incomplete     Studies: No results found.  Scheduled Meds: . buprenorphine  4 mg Sublingual PC lunch  . buprenorphine  8 mg Sublingual BID  . enoxaparin (LOVENOX) injection  40 mg Subcutaneous Q12H  . nafcillin IV  2 g Intravenous Q4H  .  nicotine  21 mg Transdermal Daily  . QUEtiapine  100 mg Oral QHS   Continuous Infusions:    Assessment/Plan:  1. Sepsis Staphylococcus species. Patient with history of IVDA. TTE negative for endocarditis.Patient will need 2 weeks of IV antibiotics from the time the last blood cultures were negative.Stop date 05/20/2016 2. Substance abuse on buprenorphine 3. Tobacco abuse on nicotine patch  Code Status:     Code Status Orders        Start     Ordered   05/06/16 2147  Full code  Continuous     05/06/16 2146    Code Status History    Date Active Date Inactive Code Status Order ID Comments User Context   This patient has a current code status but no historical code status.     Family Communication: Family at bedside Disposition Plan: Will receive IV antibiotics  09/07 Consultants:  Infectious disease  Antibiotics:  Vancomycin  Time spent: 25 minutes  Brittiney Dicostanzo, [facility]  [employer]

## 2016-05-12 LAB — CULTURE, BLOOD (ROUTINE X 2): Culture: NO GROWTH

## 2016-05-12 NOTE — Progress Notes (Addendum)
Pharmacy Antibiotic Note  Randy FloodRichard L Sandiford Jr. is a 35 y.o. male admitted on 05/06/2016 with MSSA bacteremia.    Pharmacy has been consulted for nafcillin dosing.  This is day #8 of antibiotics. ID is following  Plan: Patient is currently on nafcillin 2 g IV q4h. Stop date for nafcillin is 05/20/16 per ID consult.   Height: 5\' 9"  (175.3 cm) Weight: (!) 334 lb 8 oz (151.7 kg) IBW/kg (Calculated) : 70.7  Temp (24hrs), Avg:98 F (36.7 C), Min:97.5 F (36.4 C), Max:98.4 F (36.9 C)   Recent Labs Lab 05/06/16 1724 05/06/16 1734 05/06/16 2302 05/09/16 0636 05/11/16 0506  WBC 8.8  --   --   --  9.8  CREATININE 0.66  --   --  0.70  --   LATICACIDVEN  --  1.5 1.1  --   --     Estimated Creatinine Clearance: 186.2 mL/min (by C-G formula based on SCr of 0.8 mg/dL).    Allergies  Allergen Reactions  . Bactrim [Sulfamethoxazole-Trimethoprim]   . Sulfa Antibiotics     Antimicrobials this admission: 8/24 Vancomycin  >> 8/25 8/25 Nafcillin  >>    Microbiology results: 8/23 BCx: MSSA  Thank you for allowing pharmacy to be a part of this patient's care.  Delsa BernKelly m Fuhrmann, PharmD Clinical Pharmacist 05/12/2016 2:23 PM

## 2016-05-13 LAB — CREATININE, SERUM
Creatinine, Ser: 0.68 mg/dL (ref 0.61–1.24)
GFR calc Af Amer: >60 mL/min (ref >60)
GFR calc non Af Amer: >60 mL/min (ref >60)

## 2016-05-13 NOTE — Progress Notes (Signed)
Patient ID: Randy Woods., male   DOB: [birth year], 35 y.o.   MRN: [id number]  [employer] PROGRESS NOTE  Randy Woods. [id number] DOB: Feb 12, 1981 DOA: 05/06/2016 [name]  HPI/Subjective: Denies any complaints getting iv antibiotics  Objective: Vitals:   05/13/16 0520 05/13/16 1248  BP: 126/72 (!) 151/84  Pulse: 78 92  Resp: 20 16  Temp: 97.6 F (36.4 C) 98.1 F (36.7 C)    Filed Weights   05/06/16 1601 05/08/16 0421  Weight: 300 lb (136.1 kg) (!) 334 lb 8 oz (151.7 kg)    ROS: Review of Systems  Constitutional: Negative for chills and fever.  Eyes: Negative for blurred vision.  Respiratory: Negative for cough and shortness of breath.   Cardiovascular: Negative for chest pain.  Gastrointestinal: Negative for abdominal pain, constipation, diarrhea, nausea and vomiting.  Genitourinary: Negative for dysuria.  Musculoskeletal: Negative for joint pain.  Neurological: Negative for dizziness and headaches.   Exam: Physical Exam  Constitutional: He is oriented to person, place, and time.  HENT:  Nose: No mucosal edema.  Mouth/Throat: No oropharyngeal exudate or posterior oropharyngeal edema.  Eyes: Conjunctivae, EOM and lids are normal. Pupils are equal, round, and reactive to light.  Neck: No JVD present. Carotid bruit is not present. No edema present. No thyroid mass and no thyromegaly present.  Cardiovascular: S1 normal and S2 normal.  Exam reveals no gallop.   No murmur heard. Pulses:      Dorsalis pedis pulses are 2+ on the right side, and 2+ on the left side.  Respiratory: No respiratory distress. He has no wheezes. He has no rhonchi. He has no rales.  GI: Soft. Bowel sounds are normal. There is no tenderness.  Musculoskeletal:       Right ankle: He exhibits swelling.       Left ankle: He exhibits swelling.  Lymphadenopathy:    He has no cervical adenopathy.  Neurological: He is alert and oriented to person, place, and time. No  cranial nerve deficit.  Skin: Skin is warm. No rash noted. Nails show no clubbing.  Psychiatric: He has a normal mood and affect.      Data Reviewed: Basic Metabolic Panel:  Recent Labs Lab 05/06/16 1724 05/09/16 0636 05/13/16 0444  NA 136 138  --   K 4.3 4.1  --   CL 105 105  --   CO2 24 29  --   GLUCOSE 120* 105*  --   BUN 11 10  --   CREATININE 0.66 0.70 0.68  CALCIUM 8.3* 8.3*  --    Liver Function Tests:  Recent Labs Lab 05/06/16 1724  AST 38  ALT 44  ALKPHOS 42  BILITOT 0.7  PROT 6.8  ALBUMIN 3.5   CBC:  Recent Labs Lab 05/06/16 1724 05/11/16 0506  WBC 8.8 9.8  NEUTROABS 5.9  --   HGB 14.8 15.2  HCT 42.4 42.6  MCV 88.3 87.3  PLT 118* 161   Cardiac Enzymes: No results for input(s): CKTOTAL, CKMB, CKMBINDEX, TROPONINI in the last 168 hours.   Recent Results (from the past 240 hour(s))  Blood Culture (routine x 2)     Status: Abnormal   Collection Time: 05/05/16  9:23 AM  Result Value Ref Range Status   Specimen Description BLOOD LEFT HAND  Final   Special Requests   Final    BOTTLES DRAWN AEROBIC AND ANAEROBIC AER ANA   Culture  Setup Time   Final  GRAM POSITIVE COCCI IN BOTH AEROBIC AND ANAEROBIC BOTTLES CRITICAL RESULT CALLED TO, READ BACK BY AND VERIFIED WITH: MATT MCBANE @ 0011 ON 05/06/2016 BY CAF Organism ID to follow    Culture STAPHYLOCOCCUS AUREUS (A)  Final   Report Status 05/08/2016 FINAL  Final   Organism ID, Bacteria STAPHYLOCOCCUS AUREUS  Final      Susceptibility   Staphylococcus aureus - MIC*    CIPROFLOXACIN <=0.5 SENSITIVE Sensitive     ERYTHROMYCIN <=0.25 SENSITIVE Sensitive     GENTAMICIN <=0.5 SENSITIVE Sensitive     OXACILLIN 0.5 SENSITIVE Sensitive     TETRACYCLINE >=16 RESISTANT Resistant     VANCOMYCIN <=0.5 SENSITIVE Sensitive     TRIMETH/SULFA <=10 SENSITIVE Sensitive     CLINDAMYCIN <=0.25 SENSITIVE Sensitive     RIFAMPIN <=0.5 SENSITIVE Sensitive     Inducible Clindamycin NEGATIVE Sensitive      * STAPHYLOCOCCUS AUREUS  Blood Culture ID Panel (Reflexed)     Status: Abnormal   Collection Time: 05/05/16  9:23 AM  Result Value Ref Range Status   Enterococcus species NOT DETECTED NOT DETECTED Final   Vancomycin resistance NOT DETECTED NOT DETECTED Final   Listeria monocytogenes NOT DETECTED NOT DETECTED Final   Staphylococcus species DETECTED (A) NOT DETECTED Final    Comment: CRITICAL RESULT CALLED TO, READ BACK BY AND VERIFIED WITH: MATT MCBANE @ 0011 ON 05/06/2016 BY CAF    Staphylococcus aureus DETECTED (A) NOT DETECTED Final    Comment: CRITICAL RESULT CALLED TO, READ BACK BY AND VERIFIED WITH: MATT MCBANE @ 0011 ON 05/06/2016 BY CAF    Methicillin resistance NOT DETECTED NOT DETECTED Final   Streptococcus species NOT DETECTED NOT DETECTED Final   Streptococcus agalactiae NOT DETECTED NOT DETECTED Final   Streptococcus pneumoniae NOT DETECTED NOT DETECTED Final   Streptococcus pyogenes NOT DETECTED NOT DETECTED Final   Acinetobacter baumannii NOT DETECTED NOT DETECTED Final   Enterobacteriaceae species NOT DETECTED NOT DETECTED Final   Enterobacter cloacae complex NOT DETECTED NOT DETECTED Final   Escherichia coli NOT DETECTED NOT DETECTED Final   Klebsiella oxytoca NOT DETECTED NOT DETECTED Final   Klebsiella pneumoniae NOT DETECTED NOT DETECTED Final   Proteus species NOT DETECTED NOT DETECTED Final   Serratia marcescens NOT DETECTED NOT DETECTED Final   Carbapenem resistance NOT DETECTED NOT DETECTED Final   Haemophilus influenzae NOT DETECTED NOT DETECTED Final   Neisseria meningitidis NOT DETECTED NOT DETECTED Final   Pseudomonas aeruginosa NOT DETECTED NOT DETECTED Final   Candida albicans NOT DETECTED NOT DETECTED Final   Candida glabrata NOT DETECTED NOT DETECTED Final   Candida krusei NOT DETECTED NOT DETECTED Final   Candida parapsilosis NOT DETECTED NOT DETECTED Final   Candida tropicalis NOT DETECTED NOT DETECTED Final  Blood Culture (routine x 2)     Status:  Abnormal   Collection Time: 05/05/16  9:28 AM  Result Value Ref Range Status   Specimen Description BLOOD LEFT AC  Final   Special Requests   Final    BOTTLES DRAWN AEROBIC AND ANAEROBIC AER ANA   Culture  Setup Time   Final    GRAM POSITIVE COCCI IN BOTH AEROBIC AND ANAEROBIC BOTTLES CRITICAL RESULT CALLED TO, READ BACK BY AND VERIFIED WITH: MATT MCBANE @ 0011 ON 05/06/2016 BY CAF    Culture (A)  Final    STAPHYLOCOCCUS AUREUS SUSCEPTIBILITIES PERFORMED ON PREVIOUS CULTURE WITHIN THE LAST 5 DAYS. Performed at [facility]    Report Status 05/08/2016 FINAL  Final  Blood Culture (routine x 2)     Status: None   Collection Time: 05/06/16  5:34 PM  Result Value Ref Range Status   Specimen Description BLOOD LEFT HAND  Final   Special Requests   Final    BOTTLES DRAWN AEROBIC AND ANAEROBIC  ANA 7ML AER 3ML   Culture NO GROWTH 5 DAYS  Final   Report Status 05/11/2016 FINAL  Final  Urine culture     Status: None   Collection Time: 05/06/16  6:30 PM  Result Value Ref Range Status   Specimen Description URINE, RANDOM  Final   Special Requests NONE  Final   Culture NO GROWTH [facility]   Final   Report Status 05/08/2016 FINAL  Final  Blood Culture (routine x 2)     Status: None   Collection Time: 05/06/16 11:25 PM  Result Value Ref Range Status   Specimen Description BLOOD RIGHT HAND  Final   Special Requests   Final    BOTTLES DRAWN AEROBIC AND ANAEROBIC AER 5ML ANA .5ML   Culture NO GROWTH 5 DAYS  Final   Report Status 05/12/2016 FINAL  Final     Studies: No results found.  Scheduled Meds: . buprenorphine  4 mg Sublingual PC lunch  . buprenorphine  8 mg Sublingual BID  . enoxaparin (LOVENOX) injection  40 mg Subcutaneous Q12H  . nafcillin IV  2 g Intravenous Q4H  . nicotine  21 mg Transdermal Daily  . QUEtiapine  100 mg Oral QHS   Continuous Infusions:    Assessment/Plan:  1. Sepsis Staphylococcus species. Patient with history  of IVDA. TTE negative for endocarditis.Patient will need 2 weeks of IV antibiotics from the time the last blood cultures were negative.Stop date 05/20/2016 2. Substance abuse on buprenorphine 3. Tobacco abuse on nicotine patch  Code Status:     Code Status Orders        Start     Ordered   05/06/16 2147  Full code  Continuous     05/06/16 2146    Code Status History    Date Active Date Inactive Code Status Order ID Comments User Context   This patient has a current code status but no historical code status.     Family Communication: Family at bedside Disposition Plan: Will receive IV antibiotics Until September 7  Consultants:  Infectious disease  Antibiotics:  Vancomycin  Time spent: 25 minutes  Naaman Curro, [facility]  [employer]

## 2016-05-14 LAB — HCV RNA BY PCR, QN RFX GENO
HCV RNA Qnt(log copy/mL): 6.057 log10 IU/mL
HepC Qn: 1140000 IU/mL

## 2016-05-14 LAB — HEPATITIS C GENOTYPE

## 2016-05-14 MED ORDER — PREDNISONE 20 MG PO TABS
40.0000 mg | ORAL_TABLET | Freq: Every day | ORAL | Status: DC
  Administered 2016-05-15 – 2016-05-21 (×7): 40 mg via ORAL
  Filled 2016-05-14 (×7): qty 2

## 2016-05-14 NOTE — Progress Notes (Signed)
Pharmacy Antibiotic Note  Randy FloodRichard L Releford Jr. is a 35 y.o. male admitted on 05/06/2016 with MSSA bacteremia.    Pharmacy has been consulted for nafcillin dosing.  This is day #10 of antibiotics. ID is following  Plan: Patient is currently on nafcillin 2 g IV q4h. Stop date for nafcillin is 05/20/16 per ID consult.   Height: 5\' 9"  (175.3 cm) Weight: (!) 334 lb 8 oz (151.7 kg) IBW/kg (Calculated) : 70.7  Temp (24hrs), Avg:98.1 F (36.7 C), Min:98 F (36.7 C), Max:98.2 F (36.8 C)   Recent Labs Lab 05/09/16 0636 05/11/16 0506 05/13/16 0444  WBC  --  9.8  --   CREATININE 0.70  --  0.68    Estimated Creatinine Clearance: 186.2 mL/min (by C-G formula based on SCr of 0.8 mg/dL).    Allergies  Allergen Reactions  . Bactrim [Sulfamethoxazole-Trimethoprim]   . Sulfa Antibiotics     Antimicrobials this admission: 8/24 Vancomycin  >> 8/25 8/25 Nafcillin  >>    Microbiology results: 8/23 BCx: MSSA  Thank you for allowing pharmacy to be a part of this patient's care.  Delsa BernKelly m Fuhrmann, PharmD Clinical Pharmacist 05/14/2016 10:36 AM

## 2016-05-14 NOTE — Progress Notes (Signed)
Patient ID: Randy Floodichard L Lovering Jr., male   DOB: Aug 29, 1981, 35 y.o.   MRN: [id number]  [employer] PROGRESS NOTE  Randy Floodichard L Munsch Jr. [id number] DOB: Aug 29, 1981 DOA: 05/06/2016 [facility]  HPI/Subjective: Complains of pain in the left arm states that been going on for one week  Objective: Vitals:   05/14/16 0526 05/14/16 1249  BP: 130/80 130/76  Pulse: 86 (!) 104  Resp: 20 17  Temp: 98 F (36.7 C) 98.1 F (36.7 C)    Filed Weights   05/06/16 1601 05/08/16 0421  Weight: 300 lb (136.1 kg) (!) 334 lb 8 oz (151.7 kg)    ROS: Review of Systems  Constitutional: Negative for chills and fever.  Eyes: Negative for blurred vision.  Respiratory: Negative for cough and shortness of breath.   Cardiovascular: Negative for chest pain.  Gastrointestinal: Negative for abdominal pain, constipation, diarrhea, nausea and vomiting.  Genitourinary: Negative for dysuria.  Musculoskeletal: Positive for joint pain.  Neurological: Negative for dizziness and headaches.   Exam: Physical Exam  Constitutional: He is oriented to person, place, and time.  HENT:  Nose: No mucosal edema.  Mouth/Throat: No oropharyngeal exudate or posterior oropharyngeal edema.  Eyes: Conjunctivae, EOM and lids are normal. Pupils are equal, round, and reactive to light.  Neck: No JVD present. Carotid bruit is not present. No edema present. No thyroid mass and no thyromegaly present.  Cardiovascular: S1 normal and S2 normal.  Exam reveals no gallop.   No murmur heard. Pulses:      Dorsalis pedis pulses are 2+ on the right side, and 2+ on the left side.  Respiratory: No respiratory distress. He has no wheezes. He has no rhonchi. He has no rales.  GI: Soft. Bowel sounds are normal. There is no tenderness.  Musculoskeletal:       Right ankle: He exhibits swelling.       Left ankle: He exhibits swelling.  Lymphadenopathy:    He has no cervical adenopathy.  Neurological: He is alert and oriented to  person, place, and time. No cranial nerve deficit.  Skin: Skin is warm. No rash noted. Nails show no clubbing.  Psychiatric: He has a normal mood and affect.      Data Reviewed: Basic Metabolic Panel:  Recent Labs Lab 05/09/16 0636 05/13/16 0444  NA 138  --   K 4.1  --   CL 105  --   CO2 29  --   GLUCOSE 105*  --   BUN 10  --   CREATININE 0.70 0.68  CALCIUM 8.3*  --    Liver Function Tests: No results for input(s): AST, ALT, ALKPHOS, BILITOT, PROT, ALBUMIN in the last 168 hours. CBC:  Recent Labs Lab 05/11/16 0506  WBC 9.8  HGB 15.2  HCT 42.6  MCV 87.3  PLT 161   Cardiac Enzymes: No results for input(s): CKTOTAL, CKMB, CKMBINDEX, TROPONINI in the last 168 hours.   Recent Results (from the past 240 hour(s))  Blood Culture (routine x 2)     Status: Abnormal   Collection Time: 05/05/16  9:23 AM  Result Value Ref Range Status   Specimen Description BLOOD LEFT HAND  Final   Special Requests   Final    BOTTLES DRAWN AEROBIC AND ANAEROBIC AER 5ML ANA 9ML   Culture  Setup Time   Final    GRAM POSITIVE COCCI IN BOTH AEROBIC AND ANAEROBIC BOTTLES CRITICAL RESULT CALLED TO, READ BACK BY AND VERIFIED WITH: MATT MCBANE @ 0011  ON 05/06/2016 BY CAF Organism ID to follow    Culture STAPHYLOCOCCUS AUREUS (A)  Final   Report Status 05/08/2016 FINAL  Final   Organism ID, Bacteria STAPHYLOCOCCUS AUREUS  Final      Susceptibility   Staphylococcus aureus - MIC*    CIPROFLOXACIN <=0.5 SENSITIVE Sensitive     ERYTHROMYCIN <=0.25 SENSITIVE Sensitive     GENTAMICIN <=0.5 SENSITIVE Sensitive     OXACILLIN 0.5 SENSITIVE Sensitive     TETRACYCLINE >=16 RESISTANT Resistant     VANCOMYCIN <=0.5 SENSITIVE Sensitive     TRIMETH/SULFA <=10 SENSITIVE Sensitive     CLINDAMYCIN <=0.25 SENSITIVE Sensitive     RIFAMPIN <=0.5 SENSITIVE Sensitive     Inducible Clindamycin NEGATIVE Sensitive     * STAPHYLOCOCCUS AUREUS  Blood Culture ID Panel (Reflexed)     Status: Abnormal   Collection  Time: 05/05/16  9:23 AM  Result Value Ref Range Status   Enterococcus species NOT DETECTED NOT DETECTED Final   Vancomycin resistance NOT DETECTED NOT DETECTED Final   Listeria monocytogenes NOT DETECTED NOT DETECTED Final   Staphylococcus species DETECTED (A) NOT DETECTED Final    Comment: CRITICAL RESULT CALLED TO, READ BACK BY AND VERIFIED WITH: MATT MCBANE @ 0011 ON 05/06/2016 BY CAF    Staphylococcus aureus DETECTED (A) NOT DETECTED Final    Comment: CRITICAL RESULT CALLED TO, READ BACK BY AND VERIFIED WITH: MATT MCBANE @ 0011 ON 05/06/2016 BY CAF    Methicillin resistance NOT DETECTED NOT DETECTED Final   Streptococcus species NOT DETECTED NOT DETECTED Final   Streptococcus agalactiae NOT DETECTED NOT DETECTED Final   Streptococcus pneumoniae NOT DETECTED NOT DETECTED Final   Streptococcus pyogenes NOT DETECTED NOT DETECTED Final   Acinetobacter baumannii NOT DETECTED NOT DETECTED Final   Enterobacteriaceae species NOT DETECTED NOT DETECTED Final   Enterobacter cloacae complex NOT DETECTED NOT DETECTED Final   Escherichia coli NOT DETECTED NOT DETECTED Final   Klebsiella oxytoca NOT DETECTED NOT DETECTED Final   Klebsiella pneumoniae NOT DETECTED NOT DETECTED Final   Proteus species NOT DETECTED NOT DETECTED Final   Serratia marcescens NOT DETECTED NOT DETECTED Final   Carbapenem resistance NOT DETECTED NOT DETECTED Final   Haemophilus influenzae NOT DETECTED NOT DETECTED Final   Neisseria meningitidis NOT DETECTED NOT DETECTED Final   Pseudomonas aeruginosa NOT DETECTED NOT DETECTED Final   Candida albicans NOT DETECTED NOT DETECTED Final   Candida glabrata NOT DETECTED NOT DETECTED Final   Candida krusei NOT DETECTED NOT DETECTED Final   Candida parapsilosis NOT DETECTED NOT DETECTED Final   Candida tropicalis NOT DETECTED NOT DETECTED Final  Blood Culture (routine x 2)     Status: Abnormal   Collection Time: 05/05/16  9:28 AM  Result Value Ref Range Status   Specimen  Description BLOOD LEFT AC  Final   Special Requests   Final    BOTTLES DRAWN AEROBIC AND ANAEROBIC AER ANA   Culture  Setup Time   Final    GRAM POSITIVE COCCI IN BOTH AEROBIC AND ANAEROBIC BOTTLES CRITICAL RESULT CALLED TO, READ BACK BY AND VERIFIED WITH: MATT MCBANE @ 0011 ON 05/06/2016 BY CAF    Culture (A)  Final    STAPHYLOCOCCUS AUREUS SUSCEPTIBILITIES PERFORMED ON PREVIOUS CULTURE WITHIN THE LAST 5 DAYS. Performed at [facility]    Report Status 05/08/2016 FINAL  Final  Blood Culture (routine x 2)     Status: None   Collection Time: 05/06/16  5:34 PM  Result Value Ref Range Status   Specimen Description BLOOD LEFT HAND  Final   Special Requests   Final    BOTTLES DRAWN AEROBIC AND ANAEROBIC  ANA AER   Culture NO GROWTH 5 DAYS  Final   Report Status 05/11/2016 FINAL  Final  Urine culture     Status: None   Collection Time: 05/06/16  6:30 PM  Result Value Ref Range Status   Specimen Description URINE, RANDOM  Final   Special Requests NONE  Final   Culture NO GROWTH Performed at [facility]   Final   Report Status 05/08/2016 FINAL  Final  Blood Culture (routine x 2)     Status: None   Collection Time: 05/06/16 11:25 PM  Result Value Ref Range Status   Specimen Description BLOOD RIGHT HAND  Final   Special Requests   Final    BOTTLES DRAWN AEROBIC AND ANAEROBIC AER ANA .   Culture NO GROWTH 5 DAYS  Final   Report Status 05/12/2016 FINAL  Final     Studies: No results found.  Scheduled Meds: . buprenorphine  4 mg Sublingual PC lunch  . buprenorphine  8 mg Sublingual BID  . enoxaparin (LOVENOX) injection  40 mg Subcutaneous Q12H  . nafcillin IV  2 g Intravenous Q4H  . nicotine  21 mg Transdermal Daily  . [START ON 05/15/2016] predniSONE  40 mg Oral Q breakfast  . QUEtiapine  100 mg Oral QHS   Continuous Infusions:    Assessment/Plan:  1. Sepsis Staphylococcus species. Patient with history of IVDA. TTE negative for  endocarditis.Patient will need 2 weeks of IV antibiotics from the time the last blood cultures were negative.Stop date 05/20/2016 2. Substance abuse on buprenorphine 3. Left arm pain suspect due to muscle sprain 4. Tobacco abuse on nicotine patch  Code Status:     Code Status Orders        Start     Ordered   05/06/16 2147  Full code  Continuous     05/06/16 2146    Code Status History    Date Active Date Inactive Code Status Order ID Comments User Context   This patient has a current code status but no historical code status.     Family Communication: Family at bedside Disposition Plan: Will receive IV antibiotics Until September 7  Consultants:  Infectious disease  Antibiotics:  Vancomycin  Time spent: 22 minutes  Winifred Bodiford, [facility]  [employer]

## 2016-05-15 LAB — CBC
HCT: 40.2 % (ref 40.0–52.0)
Hemoglobin: 14.3 g/dL (ref 13.0–18.0)
MCH: 30.6 pg (ref 26.0–34.0)
MCHC: 35.5 g/dL (ref 32.0–36.0)
MCV: 86.3 fL (ref 80.0–100.0)
Platelets: 161 10*3/uL (ref 150–440)
RBC: 4.66 MIL/uL (ref 4.40–5.90)
RDW: 12.8 % (ref 11.5–14.5)
WBC: 11.4 10*3/uL — ABNORMAL HIGH (ref 3.8–10.6)

## 2016-05-15 NOTE — Progress Notes (Signed)
Patient ID: Randy Flood., male   DOB: [birth year], 35 y.o.   MRN: [id number]  [employer] PROGRESS NOTE  Randy Flood. [id number] DOB: 1980-10-22 DOA: 05/06/2016 [name]  HPI/Subjective: States that he did not receive steroid for the arm pain yesterday otherwise denies any complaints  Objective: Vitals:   05/14/16 2032 05/15/16 0436  BP: (!) 144/87 120/74  Pulse: 99 83  Resp: 20 18  Temp: 98.2 F (36.8 C) 97.9 F (36.6 C)    Filed Weights   05/06/16 1601 05/08/16 0421  Weight: 300 lb (136.1 kg) (!) 334 lb 8 oz (151.7 kg)    ROS: Review of Systems  Constitutional: Negative for chills and fever.  Eyes: Negative for blurred vision.  Respiratory: Negative for cough and shortness of breath.   Cardiovascular: Negative for chest pain.  Gastrointestinal: Negative for abdominal pain, constipation, diarrhea, nausea and vomiting.  Genitourinary: Negative for dysuria.  Musculoskeletal: Positive for joint pain.  Neurological: Negative for dizziness and headaches.   Exam: Physical Exam  Constitutional: He is oriented to person, place, and time.  HENT:  Nose: No mucosal edema.  Mouth/Throat: No oropharyngeal exudate or posterior oropharyngeal edema.  Eyes: Conjunctivae, EOM and lids are normal. Pupils are equal, round, and reactive to light.  Neck: No JVD present. Carotid bruit is not present. No edema present. No thyroid mass and no thyromegaly present.  Cardiovascular: S1 normal and S2 normal.  Exam reveals no gallop.   No murmur heard. Pulses:      Dorsalis pedis pulses are 2+ on the right side, and 2+ on the left side.  Respiratory: No respiratory distress. He has no wheezes. He has no rhonchi. He has no rales.  GI: Soft. Bowel sounds are normal. There is no tenderness.  Musculoskeletal:       Right ankle: He exhibits swelling.       Left ankle: He exhibits swelling.  Lymphadenopathy:    He has no cervical adenopathy.  Neurological: He  is alert and oriented to person, place, and time. No cranial nerve deficit.  Skin: Skin is warm. No rash noted. Nails show no clubbing.  Psychiatric: He has a normal mood and affect.      Data Reviewed: Basic Metabolic Panel:  Recent Labs Lab 05/09/16 0636 05/13/16 0444  NA 138  --   K 4.1  --   CL 105  --   CO2 29  --   GLUCOSE 105*  --   BUN 10  --   CREATININE 0.70 0.68  CALCIUM 8.3*  --    Liver Function Tests: No results for input(s): AST, ALT, ALKPHOS, BILITOT, PROT, ALBUMIN in the last 168 hours. CBC:  Recent Labs Lab 05/11/16 0506 05/15/16 0446  WBC 9.8 11.4*  HGB 15.2 14.3  HCT 42.6 40.2  MCV 87.3 86.3  PLT 161 161   Cardiac Enzymes: No results for input(s): CKTOTAL, CKMB, CKMBINDEX, TROPONINI in the last 168 hours.   Recent Results (from the past 240 hour(s))  Blood Culture (routine x 2)     Status: None   Collection Time: 05/06/16  5:34 PM  Result Value Ref Range Status   Specimen Description BLOOD LEFT HAND  Final   Special Requests   Final    BOTTLES DRAWN AEROBIC AND ANAEROBIC  ANA AER   Culture NO GROWTH 5 DAYS  Final   Report Status 05/11/2016 FINAL  Final  Urine culture     Status: None  Collection Time: 05/06/16  6:30 PM  Result Value Ref Range Status   Specimen Description URINE, RANDOM  Final   Special Requests NONE  Final   Culture NO GROWTH [facility]   Final   Report Status 05/08/2016 FINAL  Final  Blood Culture (routine x 2)     Status: None   Collection Time: 05/06/16 11:25 PM  Result Value Ref Range Status   Specimen Description BLOOD RIGHT HAND  Final   Special Requests   Final    BOTTLES DRAWN AEROBIC AND ANAEROBIC AER 5ML ANA .5ML   Culture NO GROWTH 5 DAYS  Final   Report Status 05/12/2016 FINAL  Final     Studies: No results found.  Scheduled Meds: . buprenorphine  4 mg Sublingual PC lunch  . buprenorphine  8 mg Sublingual BID  . enoxaparin (LOVENOX) injection  40 mg Subcutaneous  Q12H  . nafcillin IV  2 g Intravenous Q4H  . nicotine  21 mg Transdermal Daily  . predniSONE  40 mg Oral Q breakfast  . QUEtiapine  100 mg Oral QHS   Continuous Infusions:    Assessment/Plan:  1. Sepsis Staphylococcus species. Patient with history of IVDA. IV antibiotics till 05/20/2016 in the house 2.  Substance abuse on buprenorphine 3. Left arm pain suspect due to muscle sprain or tendinitis prednisone 4. Tobacco abuse on nicotine patch  Code Status:     Code Status Orders        Start     Ordered   05/06/16 2147  Full code  Continuous     05/06/16 2146    Code Status History    Date Active Date Inactive Code Status Order ID Comments User Context   This patient has a current code status but no historical code status.     Family Communication: Family at bedside Disposition Plan: Will receive IV antibiotics Until September 7  Consultants:  Infectious disease  Antibiotics:  Vancomycin  Time spent: 22 minutes  Mayur Duman, [facility]  [employer]

## 2016-05-16 NOTE — Progress Notes (Signed)
Patient ID: Randy Flood., male   DOB: [birth year], 35 y.o.   MRN: [id number]  [employer] PROGRESS NOTE  Randy Flood. [id number]:[id number] DOB: [birth year] DOA: 05/06/2016 [facility]: [name]  HPI/Subjective: [facility] of back pain which is chronic for him on pain improved  Objective: Vitals:   05/15/16 2033 05/16/16 0634  BP: 131/71 139/72  Pulse: 98 83  Resp: 18 18  Temp: 98 F (36.7 C) 98.1 F (36.7 C)    Filed Weights   05/06/16 1601 05/08/16 0421  Weight: 300 lb (136.1 kg) (!) 334 lb 8 oz (151.7 kg)    ROS: Review of Systems  Constitutional: Negative for chills and fever.  Eyes: Negative for blurred vision.  Respiratory: Negative for cough and shortness of breath.   Cardiovascular: Negative for chest pain.  Gastrointestinal: Negative for abdominal pain, constipation, diarrhea, nausea and vomiting.  Genitourinary: Negative for dysuria.  Musculoskeletal: Positive for back pain and joint pain.  Neurological: Negative for dizziness and headaches.   Exam: Physical Exam  Constitutional: He is oriented to person, place, and time.  HENT:  Nose: No mucosal edema.  Mouth/Throat: No oropharyngeal exudate or posterior oropharyngeal edema.  Eyes: Conjunctivae, EOM and lids are normal. Pupils are equal, round, and reactive to light.  Neck: No JVD present. Carotid bruit is not present. No edema present. No thyroid mass and no thyromegaly present.  Cardiovascular: S1 normal and S2 normal.  Exam reveals no gallop.   No murmur heard. Pulses:      Dorsalis pedis pulses are 2+ on the right side, and 2+ on the left side.  Respiratory: No respiratory distress. He has no wheezes. He has no rhonchi. He has no rales.  GI: Soft. Bowel sounds are normal. There is no tenderness.  Musculoskeletal:       Right ankle: He exhibits swelling.       Left ankle: He exhibits swelling.  Lymphadenopathy:    He has no cervical adenopathy.  Neurological: He is alert and oriented to  person, place, and time. No cranial nerve deficit.  Skin: Skin is warm. No rash noted. Nails show no clubbing.  Psychiatric: He has a normal mood and affect.      Data Reviewed: Basic Metabolic Panel:  Recent Labs Lab 05/13/16 0444  CREATININE 0.68   Liver Function Tests: No results for input(s): AST, ALT, ALKPHOS, BILITOT, PROT, ALBUMIN in the last 168 hours. CBC:  Recent Labs Lab 05/11/16 0506 05/15/16 0446  WBC 9.8 11.4*  HGB 15.2 14.3  HCT 42.6 40.2  MCV 87.3 86.3  PLT 161 161   Cardiac Enzymes: No results for input(s): CKTOTAL, CKMB, CKMBINDEX, TROPONINI in the last 168 hours.   Recent Results (from the past 240 hour(s))  Blood Culture (routine x 2)     Status: None   Collection Time: 05/06/16  5:34 PM  Result Value Ref Range Status   Specimen Description BLOOD LEFT HAND  Final   Special Requests   Final    BOTTLES DRAWN AEROBIC AND ANAEROBIC  ANA AER   Culture NO GROWTH 5 DAYS  Final   Report Status 05/11/2016 FINAL  Final  Urine culture     Status: None   Collection Time: 05/06/16  6:30 PM  Result Value Ref Range Status   Specimen Description URINE, RANDOM  Final   Special Requests NONE  Final   Culture NO GROWTH Performed at [facility]   Final   Report Status 05/08/2016  FINAL  Final  Blood Culture (routine x 2)     Status: None   Collection Time: 05/06/16 11:25 PM  Result Value Ref Range Status   Specimen Description BLOOD RIGHT HAND  Final   Special Requests   Final    BOTTLES DRAWN AEROBIC AND ANAEROBIC AER 5ML ANA .5ML   Culture NO GROWTH 5 DAYS  Final   Report Status 05/12/2016 FINAL  Final     Studies: No results found.  Scheduled Meds: . buprenorphine  4 mg Sublingual PC lunch  . buprenorphine  8 mg Sublingual BID  . enoxaparin (LOVENOX) injection  40 mg Subcutaneous Q12H  . nafcillin IV  2 g Intravenous Q4H  . nicotine  21 mg Transdermal Daily  . predniSONE  40 mg Oral Q breakfast  . QUEtiapine  100 mg Oral QHS    Continuous Infusions:    Assessment/Plan:  1. Sepsis Staphylococcus species. Patient with history of IVDA. IV antibiotics till 05/20/2016 will need to stay in the hospital for that 2.  Substance abuse on buprenorphine 3. Left arm pain suspect due to muscle sprain or tendinitis prednisone one more dose and then stop after today 4. Tobacco abuse on nicotine patch  Code Status:     Code Status Orders        Start     Ordered   05/06/16 2147  Full code  Continuous     05/06/16 2146    Code Status History    Date Active Date Inactive Code Status Order ID Comments User Context   This patient has a current code status but no historical code status.     Family Communication: Family at bedside Disposition Plan: Will receive IV antibiotics Until September 7  Consultants:  Infectious disease  Antibiotics:  Vancomycin  Time spent: 22 minutes  Randy Woods, [facility]  [employer]

## 2016-05-17 NOTE — Progress Notes (Signed)
Patient ID: Randy Woods., male   DOB: 1981/03/26, 35 y.o.   MRN: [id number]  [employer] PROGRESS NOTE  Randy Woods. WUJ:[id number] DOB: 05/06/1981 DOA: 05/06/2016 [facility]: [name]  HPI/Subjective: Feeling better denies any complaints no shortness of breath no nausea vomiting  Objective: Vitals:   05/16/16 2030 05/17/16 0422  BP: (!) 144/79 111/64  Pulse: 95 90  Resp: (!) 22 20  Temp: 98.2 F (36.8 C) 97.5 F (36.4 C)    Filed Weights   05/06/16 1601 05/08/16 0421  Weight: 300 lb (136.1 kg) (!) 334 lb 8 oz (151.7 kg)    ROS: Review of Systems  Constitutional: Negative for chills and fever.  Eyes: Negative for blurred vision.  Respiratory: Negative for cough and shortness of breath.   Cardiovascular: Negative for chest pain.  Gastrointestinal: Negative for abdominal pain, constipation, diarrhea, nausea and vomiting.  Genitourinary: Negative for dysuria.  Musculoskeletal: Positive for back pain and joint pain.  Neurological: Negative for dizziness and headaches.   Exam: Physical Exam  Constitutional: He is oriented to person, place, and time.  HENT:  Nose: No mucosal edema.  Mouth/Throat: No oropharyngeal exudate or posterior oropharyngeal edema.  Eyes: Conjunctivae, EOM and lids are normal. Pupils are equal, round, and reactive to light.  Neck: No JVD present. Carotid bruit is not present. No edema present. No thyroid mass and no thyromegaly present.  Cardiovascular: S1 normal and S2 normal.  Exam reveals no gallop.   No murmur heard. Pulses:      Dorsalis pedis pulses are 2+ on the right side, and 2+ on the left side.  Respiratory: No respiratory distress. He has no wheezes. He has no rhonchi. He has no rales.  GI: Soft. Bowel sounds are normal. There is no tenderness.  Musculoskeletal:       Right ankle: He exhibits swelling.       Left ankle: He exhibits swelling.  Lymphadenopathy:    He has no cervical adenopathy.  Neurological: He is  alert and oriented to person, place, and time. No cranial nerve deficit.  Skin: Skin is warm. No rash noted. Nails show no clubbing.  Psychiatric: He has a normal mood and affect.      Data Reviewed: Basic Metabolic Panel:  Recent Labs Lab 05/13/16 0444  CREATININE 0.68   Liver Function Tests: No results for input(s): AST, ALT, ALKPHOS, BILITOT, PROT, ALBUMIN in the last 168 hours. CBC:  Recent Labs Lab 05/11/16 0506 05/15/16 0446  WBC 9.8 11.4*  HGB 15.2 14.3  HCT 42.6 40.2  MCV 87.3 86.3  PLT 161 161   Cardiac Enzymes: No results for input(s): CKTOTAL, CKMB, CKMBINDEX, TROPONINI in the last 168 hours.   No results found for this or any previous visit (from the past 240 hour(s)).   Studies: No results found.  Scheduled Meds: . buprenorphine  4 mg Sublingual PC lunch  . buprenorphine  8 mg Sublingual BID  . enoxaparin (LOVENOX) injection  40 mg Subcutaneous Q12H  . nafcillin IV  2 g Intravenous Q4H  . nicotine  21 mg Transdermal Daily  . predniSONE  40 mg Oral Q breakfast  . QUEtiapine  100 mg Oral QHS   Continuous Infusions:    Assessment/Plan:  1. Sepsis Staphylococcus species. Patient with history of IVDA. IV antibiotics till 05/20/2016 will need to stay in the hospital for that 2.  Substance abuse on buprenorphine 3. Left arm pain suspect due to muscle sprain or tendinitis Stop predinosone  4. Tobacco abuse on nicotine patch  Code Status:     Code Status Orders        Start     Ordered   05/06/16 2147  Full code  Continuous     05/06/16 2146    Code Status History    Date Active Date Inactive Code Status Order ID Comments User Context   This patient has a current code status but no historical code status.     Family Communication: Family at bedside Disposition Plan: Will receive IV antibiotics Until September 7  Consultants:  Infectious disease  Antibiotics:  Vancomycin  Time spent: 22 minutes  Darbi Chandran, [facility]  [employer]

## 2016-05-17 NOTE — Plan of Care (Signed)
Problem: Physical Regulation: Goal: Will remain free from infection Outcome: Progressing Pt receiving antibiotics

## 2016-05-17 NOTE — Progress Notes (Signed)
Pharmacy Antibiotic Note  Randy FloodRichard L Gibb Jr. is a 35 y.o. male admitted on 05/06/2016 with MSSA bacteremia.    Pharmacy has been consulted for nafcillin dosing.  This is day #13 of antibiotics. ID is following  Plan: Patient is currently on nafcillin 2 g IV q4h. Stop date for nafcillin is 05/20/16 per ID consult.   Height: 5\' 9"  (175.3 cm) Weight: (!) 334 lb 8 oz (151.7 kg) IBW/kg (Calculated) : 70.7  Temp (24hrs), Avg:97.9 F (36.6 C), Min:97.5 F (36.4 C), Max:98.2 F (36.8 C)   Recent Labs Lab 05/11/16 0506 05/13/16 0444 05/15/16 0446  WBC 9.8  --  11.4*  CREATININE  --  0.68  --     Estimated Creatinine Clearance: 186.2 mL/min (by C-G formula based on SCr of 0.8 mg/dL).    Allergies  Allergen Reactions  . Bactrim [Sulfamethoxazole-Trimethoprim]   . Sulfa Antibiotics     Antimicrobials this admission: 8/24 Vancomycin  >> 8/25 8/25 Nafcillin  >>    Microbiology results: 8/23 BCx: MSSA  Thank you for allowing pharmacy to be a part of this patient's care.  Demetrius CharityJames,Virgilia Quigg D, PharmD Clinical Pharmacist 05/17/2016 10:07 AM

## 2016-05-18 MED ORDER — IBUPROFEN 400 MG PO TABS
400.0000 mg | ORAL_TABLET | Freq: Four times a day (QID) | ORAL | Status: DC | PRN
  Administered 2016-05-20: 400 mg via ORAL
  Filled 2016-05-18 (×2): qty 1

## 2016-05-18 NOTE — Progress Notes (Signed)
Patient ID: Randy Floodichard L Radwan Jr., male   DOB: 05/30/1981, 35 y.o.   MRN: [id number]  [employer] PROGRESS NOTE  Randy Floodichard L Kendall Jr. WUJ:[id number] DOB: 05/30/1981 DOA: 05/06/2016 [facility]  HPI/Subjective: C/o left elbow pain going down the left hand.  no numbness of left hand. Patient says that he had 5  Cigars since he is here.he was going off the floor and admits smoking. Told him that he cannot have privileges to go out anymore and needs to stay in the room all the time, offered nicotine patch. Objective: Vitals:   05/17/16 2049 05/18/16 0423  BP: (!) 144/80 126/62  Pulse: (!) 113 93  Resp: 20 20  Temp: 98.4 F (36.9 C) 98.2 F (36.8 C)    Filed Weights   05/06/16 1601 05/08/16 0421  Weight: 136.1 kg (300 lb) (!) 151.7 kg (334 lb 8 oz)    ROS: Review of Systems  Constitutional: Negative for chills and fever.  Eyes: Negative for blurred vision.  Respiratory: Negative for cough and shortness of breath.   Cardiovascular: Negative for chest pain.  Gastrointestinal: Negative for abdominal pain, constipation, diarrhea, nausea and vomiting.  Genitourinary: Negative for dysuria.  Musculoskeletal: Positive for back pain and joint pain.  Neurological: Negative for dizziness and headaches.   Exam: Physical Exam  Constitutional: He is oriented to person, place, and time.  HENT:  Nose: No mucosal edema.  Mouth/Throat: No oropharyngeal exudate or posterior oropharyngeal edema.  Eyes: Conjunctivae, EOM and lids are normal. Pupils are equal, round, and reactive to light.  Neck: No JVD present. Carotid bruit is not present. No edema present. No thyroid mass and no thyromegaly present.  Cardiovascular: S1 normal and S2 normal.  Exam reveals no gallop.   No murmur heard. Pulses:      Dorsalis pedis pulses are 2+ on the right side, and 2+ on the left side.  Respiratory: No respiratory distress. He has no wheezes. He has no rhonchi. He has no rales.  GI: Soft. Bowel  sounds are normal. There is no tenderness.  Musculoskeletal:       Right ankle: He exhibits swelling.       Left ankle: He exhibits swelling.  Lymphadenopathy:    He has no cervical adenopathy.  Neurological: He is alert and oriented to person, place, and time. No cranial nerve deficit.  Skin: Skin is warm. No rash noted. Nails show no clubbing.  Psychiatric: He has a normal mood and affect.      Data Reviewed: Basic Metabolic Panel:  Recent Labs Lab 05/13/16 0444  CREATININE 0.68   Liver Function Tests: No results for input(s): AST, ALT, ALKPHOS, BILITOT, PROT, ALBUMIN in the last 168 hours. CBC:  Recent Labs Lab 05/15/16 0446  WBC 11.4*  HGB 14.3  HCT 40.2  MCV 86.3  PLT 161   Cardiac Enzymes: No results for input(s): CKTOTAL, CKMB, CKMBINDEX, TROPONINI in the last 168 hours.   No results found for this or any previous visit (from the past 240 hour(s)).   Studies: No results found.  Scheduled Meds: . buprenorphine  4 mg Sublingual PC lunch  . buprenorphine  8 mg Sublingual BID  . enoxaparin (LOVENOX) injection  40 mg Subcutaneous Q12H  . nafcillin IV  2 g Intravenous Q4H  . nicotine  21 mg Transdermal Daily  . predniSONE  40 mg Oral Q breakfast  . QUEtiapine  100 mg Oral QHS   Continuous Infusions:    Assessment/Plan:  1. Sepsis  Staphylococcus species. Patient with history of IVDA. IV antibiotics till 05/20/2016 will need to stay in the hospital for that, Continue nafcillin. Likely discharge on September 7. Patient is on nafcillin 2 g IV every 4 hours patient will continue on antibiotics till September 7 and then discharged home after the last dose of antibiotics on September 7. 2.  Substance abuse on buprenorphine 3. Left arm pain suspect due to muscle sprain or tendinitis use Advil 400 MG every 6 hours.; 4. Tobacco abuse on nicotine patch; patient cannot have privileges to out and smoke . He is agreeable for this. Appreciate presence of RN during  rounds,.  Code Status:     Code Status Orders        Start     Ordered   05/06/16 2147  Full code  Continuous     05/06/16 2146    Code Status History    Date Active Date Inactive Code Status Order ID Comments User Context   This patient has a current code status but no historical code status.     Family Communication: Family at bedside Disposition Plan: Will receive IV antibiotics Until September 7  Consultants:  Infectious disease  Antibiotics:Nafcillin.  Time spent: 22 minutes  [employer]

## 2016-05-18 NOTE — Progress Notes (Signed)
Pt states that he went outside to smoke this morning. Advised pt on non-smoking policy, also advised that provider would have to authorize for pt to leave nursing unit. Educated on safety implications and risks. Pt verbalized understanding.

## 2016-05-18 NOTE — Progress Notes (Signed)
Initial Nutrition Assessment  DOCUMENTATION CODES:   Morbid obesity  INTERVENTION:  -Monitor intake and cater to pt preferences   NUTRITION DIAGNOSIS:    (none at this time) related to   as evidenced by  .    GOAL:   Patient will meet greater than or equal to 90% of their needs    MONITOR:   PO intake  REASON FOR ASSESSMENT:   LOS    ASSESSMENT:      Pt admitted with sepsis, MSSA bactermia.  IV antibiotics being administered via PICC line in house due to history of IVDA.    Pt eating lunch on visit this pm and tolerating well.  Pt eating 100% of meals.  Reports normal intake prior to admission  Medications reviewed:   Labs reviewed: glucose 105  Diet Order:  Diet regular Room service appropriate? Yes; Fluid consistency: Thin  Skin:  Reviewed, no issues  Last BM:  9/2  Height:   Ht Readings from Last 1 Encounters:  05/06/16 5\' 9"  (1.753 m)    Weight:   Wt Readings from Last 1 Encounters:  05/08/16 (!) 334 lb 8 oz (151.7 kg)    Ideal Body Weight:     BMI:  Body mass index is 49.4 kg/m.  Estimated Nutritional Needs:   Kcal:  2400 kcals/d  Protein:  120 g/d  Fluid:  >/= 2.4 L/d  EDUCATION NEEDS:   No education needs identified at this time  Amedio Bowlby B. Freida BusmanAllen, RD, LDN [phone] (pager) Weekend/On-Call pager [phone])

## 2016-05-19 LAB — CREATININE, SERUM
Creatinine, Ser: 0.63 mg/dL (ref 0.61–1.24)
GFR calc Af Amer: >60 mL/min (ref >60)
GFR calc non Af Amer: >60 mL/min (ref >60)

## 2016-05-19 MED ORDER — ACETAMINOPHEN 325 MG PO TABS: 650.0000 mg | ORAL_TABLET | Freq: Four times a day (QID) | ORAL | Status: DC | PRN

## 2016-05-19 NOTE — Progress Notes (Signed)
Pharmacy Antibiotic Note  Randy FloodRichard L Poer Jr. is a 35 y.o. male admitted on 05/06/2016 with MSSA bacteremia.    Pharmacy has been consulted for nafcillin dosing.  This is day #15 of antibiotics. ID is following  Plan: Patient is currently on nafcillin 2 g IV q4h. Stop date for nafcillin is 05/20/16 per ID consult.   Height: 5\' 9"  (175.3 cm) Weight: (!) 334 lb 8 oz (151.7 kg) IBW/kg (Calculated) : 70.7  Temp (24hrs), Avg:98.6 F (37 C), Min:98.1 F (36.7 C), Max:99 F (37.2 C)   Recent Labs Lab 05/13/16 0444 05/15/16 0446 05/19/16 0553  WBC  --  11.4*  --   CREATININE 0.68  --  0.63    Estimated Creatinine Clearance: 186.2 mL/min (by C-G formula based on SCr of 0.8 mg/dL).    Allergies  Allergen Reactions  . Bactrim [Sulfamethoxazole-Trimethoprim]   . Sulfa Antibiotics     Antimicrobials this admission: 8/24 Vancomycin  >> 8/25 8/25 Nafcillin  >>    Microbiology results: 8/23 BCx: MSSA  Thank you for allowing pharmacy to be a part of this patient's care.  Delsa BernKelly m Kaige Whistler, PharmD Clinical Pharmacist 05/19/2016 10:14 AM

## 2016-05-19 NOTE — Progress Notes (Signed)
[facility] PROGRESS NOTE Date of Admission:  05/06/2016     ID: Randy Floodichard L Avina Jr. is a 35 y.o. male with  MRSA bacteremia, IVDU Principal Problem:   Bipolar 1 disorder, mixed, moderate (HCC) Active Problems:   Opiate abuse, continuous   Staphylococcal sepsis (HCC)   Subjective: No fevers, TEE neg  ROS  Eleven systems are reviewed and negative except per hpi  Medications:  Antibiotics Given (last 72 hours)    Date/Time Action Medication Dose Rate   05/16/16 1713 Given   nafcillin 2 g in dextrose 5 % 100 mL IVPB 2 g 200 mL/hr   05/16/16 2041 Given   nafcillin 2 g in dextrose 5 % 100 mL IVPB 2 g 200 mL/hr   05/17/16 0045 Given   nafcillin 2 g in dextrose 5 % 100 mL IVPB 2 g 200 mL/hr   05/17/16 0523 Given   nafcillin 2 g in dextrose 5 % 100 mL IVPB 2 g 200 mL/hr   05/17/16 0932 Given   nafcillin 2 g in dextrose 5 % 100 mL IVPB 2 g 200 mL/hr   05/17/16 1325 Given   nafcillin 2 g in dextrose 5 % 100 mL IVPB 2 g 200 mL/hr   05/17/16 1713 Given   nafcillin 2 g in dextrose 5 % 100 mL IVPB 2 g 200 mL/hr   05/17/16 2152 Given   nafcillin 2 g in dextrose 5 % 100 mL IVPB 2 g 200 mL/hr   05/18/16 0054 Given   nafcillin 2 g in dextrose 5 % 100 mL IVPB 2 g 200 mL/hr   05/18/16 0452 Given   nafcillin 2 g in dextrose 5 % 100 mL IVPB 2 g 200 mL/hr   05/18/16 0911 Given   nafcillin 2 g in dextrose 5 % 100 mL IVPB 2 g 200 mL/hr   05/18/16 1249 Given   nafcillin 2 g in dextrose 5 % 100 mL IVPB 2 g 200 mL/hr   05/18/16 1824 Given   nafcillin 2 g in dextrose 5 % 100 mL IVPB 2 g 200 mL/hr   05/18/16 2111 Given   nafcillin 2 g in dextrose 5 % 100 mL IVPB 2 g 200 mL/hr   05/19/16 0105 Given   nafcillin 2 g in dextrose 5 % 100 mL IVPB 2 g 200 mL/hr   05/19/16 0516 Given   nafcillin 2 g in dextrose 5 % 100 mL IVPB 2 g 200 mL/hr   05/19/16 1015 Given   nafcillin 2 g in dextrose 5 % 100 mL IVPB 2 g 200 mL/hr   05/19/16 1405 Given   nafcillin 2 g in dextrose 5 % 100 mL  IVPB 2 g 200 mL/hr     . buprenorphine  4 mg Sublingual PC lunch  . buprenorphine  8 mg Sublingual BID  . enoxaparin (LOVENOX) injection  40 mg Subcutaneous Q12H  . nafcillin IV  2 g Intravenous Q4H  . nicotine  21 mg Transdermal Daily  . predniSONE  40 mg Oral Q breakfast  . QUEtiapine  100 mg Oral QHS    Objective: Vital signs in last 24 hours: Temp:  [98.1 F (36.7 C)-99 F (37.2 C)] 98.3 F (36.8 C) (09/06 1358) Pulse Rate:  [92-116] 116 (09/06 1358) Resp:  [18-19] 19 (09/06 1358) BP: (127-142)/(71-89) 129/71 (09/06 1358) SpO2:  [95 %-100 %] 95 % (09/06 1358) Constitutional: He is oriented to person, place, and time. Obese HENT: anicteric,  Mouth/Throat: Oropharynx is clear and  moist. No oropharyngeal exudate.  Cardiovascular: Normal rate, regular rhythm and normal heart sounds. No murmur heard.  Pulmonary/Chest: Effort normal and breath sounds normal. No respiratory distress. He has no wheezes.  Abdominal: Soft. Bowel sounds are normal. He exhibits no distension. There is no tenderness.  Lymphadenopathy: no cervical adenopathy.  Neurological: He is alert and oriented to person, place, and time.  Skin: Skin is warm and dry. No rash noted. No erythema. L forearm with small scab Psychiatric: He has a normal mood and affect. His behavior is normal.   Lab Results  Recent Labs  05/19/16 0553  CREATININE 0.63    Microbiology: Results for orders placed or performed during the hospital encounter of 05/06/16  Blood Culture (routine x 2)     Status: None   Collection Time: 05/06/16  5:34 PM  Result Value Ref Range Status   Specimen Description BLOOD LEFT HAND  Final   Special Requests   Final    BOTTLES DRAWN AEROBIC AND ANAEROBIC  ANA AER   Culture NO GROWTH 5 DAYS  Final   Report Status 05/11/2016 FINAL  Final  Urine culture     Status: None   Collection Time: 05/06/16  6:30 PM  Result Value Ref Range Status   Specimen Description URINE, RANDOM  Final    Special Requests NONE  Final   Culture NO GROWTH Performed at [facility]   Final   Report Status 05/08/2016 FINAL  Final  Blood Culture (routine x 2)     Status: None   Collection Time: 05/06/16 11:25 PM  Result Value Ref Range Status   Specimen Description BLOOD RIGHT HAND  Final   Special Requests   Final    BOTTLES DRAWN AEROBIC AND ANAEROBIC AER ANA .   Culture NO GROWTH 5 DAYS  Final   Report Status 05/12/2016 FINAL  Final    Studies/Results: Dg Chest 2 View  Result Date: 05/06/2016 CLINICAL DATA:  Sepsis, fever for 3 days EXAM: CHEST  2 VIEW COMPARISON:  05/05/2016 FINDINGS: The heart size and mediastinal contours are within normal limits. Both lungs are clear. The visualized skeletal structures are unremarkable. IMPRESSION: No active cardiopulmonary disease. Electronically Signed   By: [name] M.D.   On: 05/06/2016 16:44   Dg Chest Portable 1 View  Result Date: 05/06/2016 CLINICAL DATA:  PICC placement. EXAM: PORTABLE CHEST 1 VIEW COMPARISON:  Frontal and lateral views earlier this day. FINDINGS: Tip of the right upper extremity PICC in the mid SVC. No pneumothorax. The cardiomediastinal contours are normal. The lungs are clear. Pulmonary vasculature is normal. No consolidation, pleural effusion, or pneumothorax. No acute osseous abnormalities are seen. IMPRESSION: Tip of the right upper extremity PICC in the mid SVC. No pneumothorax or acute abnormality. Electronically Signed   By: [name] M.D.   On: 05/06/2016 21:09   Dg Chest Port 1 View  Result Date: 05/05/2016 CLINICAL DATA:  Fever with chest pain and shortness of breath EXAM: PORTABLE CHEST 1 VIEW COMPARISON:  May 31, 2015 FINDINGS: Lungs are clear. The heart size and pulmonary vascularity are normal. No adenopathy. No bone lesions. IMPRESSION: No edema or consolidation. Electronically Signed   By: [name] M.D.   On: 05/05/2016 10:06    Assessment/Plan: [name]. is a  35 y.o. male with hx IVDU now with staph aureus  (MSSA) bacteremia . TTE negative. FU bcx done 8/24 but had to have picc placed at admission  since no IV access obtainable.   TEE neg  for endocarditis  Hep C + HIV negative Recommendations Once finished Nafcillin for MSSA bacteremia can dc picc and dc home  Hep C + - Genotype 1a Will need referral for treatment I can see in 1 mo or so in clinic to initiate treatment evaluation or can be referred to [facility] to see Dr Ephraim Hamburger if patient desires Thank you very much for the consult. Will follow with you.  Rainna Nearhood P   05/19/2016, 2:36 PM

## 2016-05-19 NOTE — Plan of Care (Signed)
Problem: Pain Managment: Goal: General experience of comfort will improve Outcome: Progressing Pt has complained of arm pain during my shift as the MD was rounding. Pt has not requested pain medication during my shift.   Problem: Physical Regulation: Goal: Ability to maintain clinical measurements within normal limits will improve Outcome: Progressing Pt has maintained all vital signs within normal limits during my shift.

## 2016-05-19 NOTE — Progress Notes (Signed)
Patient ID: Randy Floodichard L Hast Jr., male   DOB: [birth year], 35 y.o.   MRN: [id number]  [facility] PROGRESS NOTE  Randy Floodichard L Kovarik Jr. WUJ:[id number] DOB: [birth year] DOA: 05/06/2016 [facility]: [facility]  HPI/Subjective:  He denies any complaints except the left elbow pain. . Objective: Vitals:   05/19/16 0603 05/19/16 0854  BP: 127/73 (!) 142/83  Pulse: 92 (!) 102  Resp:  18  Temp: 98.4 F (36.9 C) 98.1 F (36.7 C)    Filed Weights   05/06/16 1601 05/08/16 0421  Weight: 136.1 kg (300 lb) (!) 151.7 kg (334 lb 8 oz)    ROS: Review of Systems  Constitutional: Negative for chills and fever.  Eyes: Negative for blurred vision.  Respiratory: Negative for cough and shortness of breath.   Cardiovascular: Negative for chest pain.  Gastrointestinal: Negative for abdominal pain, constipation, diarrhea, nausea and vomiting.  Genitourinary: Negative for dysuria.  Musculoskeletal: Positive for back pain and joint pain.  Neurological: Negative for dizziness and headaches.   Exam: Physical Exam  Constitutional: He is oriented to person, place, and time.  HENT:  Nose: No mucosal edema.  Mouth/Throat: No oropharyngeal exudate or posterior oropharyngeal edema.  Eyes: Conjunctivae, EOM and lids are normal. Pupils are equal, round, and reactive to light.  Neck: No JVD present. Carotid bruit is not present. No edema present. No thyroid mass and no thyromegaly present.  Cardiovascular: S1 normal and S2 normal.  Exam reveals no gallop.   No murmur heard. Pulses:      Dorsalis pedis pulses are 2+ on the right side, and 2+ on the left side.  Respiratory: No respiratory distress. He has no wheezes. He has no rhonchi. He has no rales.  GI: Soft. Bowel sounds are normal. There is no tenderness.  Musculoskeletal:       Right ankle: He exhibits swelling.       Left ankle: He exhibits swelling.  Lymphadenopathy:    He has no cervical adenopathy.  Neurological: He is alert and oriented to  person, place, and time. No cranial nerve deficit.  Skin: Skin is warm. No rash noted. Nails show no clubbing.  Psychiatric: He has a normal mood and affect.      Data Reviewed: Basic Metabolic Panel:  Recent Labs Lab 05/13/16 0444 05/19/16 0553  CREATININE 0.68 0.63   Liver Function Tests: No results for input(s): AST, ALT, ALKPHOS, BILITOT, PROT, ALBUMIN in the last 168 hours. CBC:  Recent Labs Lab 05/15/16 0446  WBC 11.4*  HGB 14.3  HCT 40.2  MCV 86.3  PLT 161   Cardiac Enzymes: No results for input(s): CKTOTAL, CKMB, CKMBINDEX, TROPONINI in the last 168 hours.   No results found for this or any previous visit (from the past 240 hour(s)).   Studies: No results found.  Scheduled Meds: . buprenorphine  4 mg Sublingual PC lunch  . buprenorphine  8 mg Sublingual BID  . enoxaparin (LOVENOX) injection  40 mg Subcutaneous Q12H  . nafcillin IV  2 g Intravenous Q4H  . nicotine  21 mg Transdermal Daily  . predniSONE  40 mg Oral Q breakfast  . QUEtiapine  100 mg Oral QHS   Continuous Infusions:    Assessment/Plan:  1. Sepsis Staphylococcus species. Patient with history of IVDA. IV antibiotics till 05/20/2016 will need to stay in the hospital for that, Continue nafcillin. Likely discharge on September 7. Patient is on nafcillin 2 g IV every 4 hours patient will continue on antibiotics till September 7 .  Last dose of antibiotic he is on September 7 at 9 PM. Patient prefers to go home on September 8. 2.  3.  Substance abuse on buprenorphine 4. Left arm pain suspect due to muscle sprain or tendinitis use Advil 400 MG every 6 hours.; Add Skelaxin in addition to Advil. 5. Tobacco abuse on nicotine patch; patient cannot have privileges to out and smoke . He is agreeable for this. Appreciate presence of RN during rounds,.  Code Status:     Code Status Orders        Start     Ordered   05/06/16 2147  Full code  Continuous     05/06/16 2146    Code Status History     Date Active Date Inactive Code Status Order ID Comments User Context   This patient has a current code status but no historical code status.     Family Communication: Family at bedside Disposition Plan: Will receive IV antibiotics Until September 7  Consultants:  Infectious disease  Antibiotics:Nafcillin.  Time spent: 22 minutes  [employer]

## 2016-05-20 LAB — CREATININE, SERUM
Creatinine, Ser: 0.7 mg/dL (ref 0.61–1.24)
GFR calc Af Amer: >60 mL/min (ref >60)
GFR calc non Af Amer: >60 mL/min (ref >60)

## 2016-05-20 NOTE — Plan of Care (Signed)
Problem: Pain Managment: Goal: General experience of comfort will improve Outcome: Progressing Minimal PRN pain meds given over last 24hrs.

## 2016-05-20 NOTE — Progress Notes (Signed)
Patient ID: Randy Flood., male   DOB: July 13, 1981, 35 y.o.   MRN: [id number]  [facility] PROGRESS NOTE  Randy Flood. WUJ:[id number] DOB: 1981/08/19 DOA: 05/06/2016 [name]  HPI/Subjective:  He denies any complaints except the left elbow pain. . Objective: Vitals:   05/19/16 2105 05/20/16 0529  BP: (!) 143/83 110/63  Pulse: (!) 111 87  Resp: 20 18  Temp: 98.4 F (36.9 C) 98.4 F (36.9 C)    Filed Weights   05/06/16 1601 05/08/16 0421  Weight: 136.1 kg (300 lb) (!) 151.7 kg (334 lb 8 oz)    ROS: Review of Systems  Constitutional: Negative for chills and fever.  Eyes: Negative for blurred vision.  Respiratory: Negative for cough and shortness of breath.   Cardiovascular: Negative for chest pain.  Gastrointestinal: Negative for abdominal pain, constipation, diarrhea, nausea and vomiting.  Genitourinary: Negative for dysuria.  Musculoskeletal: Positive for back pain and joint pain.  Neurological: Negative for dizziness and headaches.   Exam: Physical Exam  Constitutional: He is oriented to person, place, and time.  HENT:  Nose: No mucosal edema.  Mouth/Throat: No oropharyngeal exudate or posterior oropharyngeal edema.  Eyes: Conjunctivae, EOM and lids are normal. Pupils are equal, round, and reactive to light.  Neck: No JVD present. Carotid bruit is not present. No edema present. No thyroid mass and no thyromegaly present.  Cardiovascular: S1 normal and S2 normal.  Exam reveals no gallop.   No murmur heard. Pulses:      Dorsalis pedis pulses are 2+ on the right side, and 2+ on the left side.  Respiratory: No respiratory distress. He has no wheezes. He has no rhonchi. He has no rales.  GI: Soft. Bowel sounds are normal. There is no tenderness.  Musculoskeletal:       Right ankle: He exhibits swelling.       Left ankle: He exhibits swelling.  Lymphadenopathy:    He has no cervical adenopathy.  Neurological: He is alert and oriented to  person, place, and time. No cranial nerve deficit.  Skin: Skin is warm. No rash noted. Nails show no clubbing.  Psychiatric: He has a normal mood and affect.      Data Reviewed: Basic Metabolic Panel:  Recent Labs Lab 05/19/16 0553 05/20/16 0442  CREATININE 0.63 0.70   Liver Function Tests: No results for input(s): AST, ALT, ALKPHOS, BILITOT, PROT, ALBUMIN in the last 168 hours. CBC:  Recent Labs Lab 05/15/16 0446  WBC 11.4*  HGB 14.3  HCT 40.2  MCV 86.3  PLT 161   Cardiac Enzymes: No results for input(s): CKTOTAL, CKMB, CKMBINDEX, TROPONINI in the last 168 hours.   No results found for this or any previous visit (from the past 240 hour(s)).   Studies: No results found.  Scheduled Meds: . buprenorphine  4 mg Sublingual PC lunch  . buprenorphine  8 mg Sublingual BID  . enoxaparin (LOVENOX) injection  40 mg Subcutaneous Q12H  . nafcillin IV  2 g Intravenous Q4H  . nicotine  21 mg Transdermal Daily  . predniSONE  40 mg Oral Q breakfast  . QUEtiapine  100 mg Oral QHS   Continuous Infusions:    Assessment/Plan:  1. Sepsis Staphylococcus species. Patient with history of IVDA. IV antibiotics till 05/20/2016 will need to stay in the hospital for that, seen at bedside.discharge home tomorrow ,discontinue picc line. 2.  Substance abuse on buprenorphine 3. Left arm pain suspect due to muscle sprain or tendinitis  use Advil 400 MG every 6 hours.; Add Skelaxin in addition to Advil. 4. Tobacco abuse on nicotine patch; patient cannot have privileges to out and smoke . He is agreeable for this. Appreciate presence of RN during rounds,.  Code Status:     Code Status Orders        Start     Ordered   05/06/16 2147  Full code  Continuous     05/06/16 2146    Code Status History    Date Active Date Inactive Code Status Order ID Comments User Context   This patient has a current code status but no historical code status.     Family Communication: Family at  bedside Disposition Plan: Will receive IV antibiotics Until September 7  Consultants:  Infectious disease  Antibiotics:Nafcillin.  Time spent: 22 minutes  [employer] ComputerKONIDENA,Muath Hallam  [employer] [facility]

## 2016-05-20 NOTE — Progress Notes (Signed)
Pharmacy Antibiotic Note  Randy FloodRichard L Murcia Jr. is a 35 y.o. male admitted on 05/06/2016 with MSSA bacteremia.    Pharmacy has been consulted for nafcillin dosing.  This is day #16 of antibiotics. ID is following  Plan: Patient is currently on nafcillin 2 g IV q4h. Stop date for nafcillin is 05/20/16 per ID consult. Abx will be d/c'd tonight.   Height: 5\' 9"  (175.3 cm) Weight: (!) 334 lb 8 oz (151.7 kg) IBW/kg (Calculated) : 70.7  Temp (24hrs), Avg:98.3 F (36.8 C), Min:98.1 F (36.7 C), Max:98.4 F (36.9 C)   Recent Labs Lab 05/15/16 0446 05/19/16 0553 05/20/16 0442  WBC 11.4*  --   --   CREATININE  --  0.63 0.70    Estimated Creatinine Clearance: 186.2 mL/min (by C-G formula based on SCr of 0.8 mg/dL).    Allergies  Allergen Reactions  . Bactrim [Sulfamethoxazole-Trimethoprim]   . Sulfa Antibiotics     Antimicrobials this admission: 8/24 Vancomycin  >> 8/25 8/25 Nafcillin  >>    Microbiology results: 8/23 BCx: MSSA  Thank you for allowing pharmacy to be a part of this patient's care.  Delsa BernKelly m Fuhrmann, PharmD Clinical Pharmacist 05/20/2016 8:49 AM

## 2016-05-21 NOTE — Discharge Instructions (Signed)
Bacteremia °Bacteremia is the presence of bacteria in the blood. A small amount of bacteria may not cause any symptoms. °Sometimes, the bacteria spread and cause infection in other parts of the body, such as the heart, joints, bones, or brain. Having a great amount of bacteria can cause a serious, sometimes life-threatening infection called sepsis. °CAUSES °This condition is caused by bacteria that get into the blood. Bacteria can enter the blood: °· During a dental or medical procedure. °· After you brush your teeth so hard that the gums bleed. °· Through a scrape or cut on your skin. °More severe types of bacteremia can be caused by: °· A bacterial infection, such as pneumonia, that spreads to the blood. °· Using a dirty needle. °RISK FACTORS °This condition is more likely to develop in: °· Children and elderly adults. °· People who have a long-lasting (chronic) disease or medical condition. °· People who have an artificial joint or heart valve. °· People who have heart valve disease. °· People who have a tube, such as a catheter or IV tube, that has been inserted for a medical treatment. °· People who have a weak body defense system (immune system). °· People who use IV drugs. °SYMPTOMS °Usually, this condition does not cause symptoms when it is mild. When it is more serious, it may cause: °· Fever. °· Chills. °· Racing heart. °· Shortness of breath. °· Dizziness. °· Weakness. °· Confusion. °· Nausea or vomiting. °· Diarrhea. °Bacteremia that has spread to other parts of the body may cause symptoms in those areas. °DIAGNOSIS °This condition may be diagnosed with a physical exam and tests, such as: °· A complete blood count (CBC). This test looks for signs of infection. °· Blood cultures. These look for bacteria in your blood. °· Tests of any IV tubes. These look for a source of infection. °· Urine tests. °· Imaging tests, such as an X-ray, CT scan, MRI, or heart ultrasound. °TREATMENT °If the condition is mild,  treatment is usually not needed. Usually, the body's immune system will remove the bacteria. If the condition is more serious, it may be treated with: °· Antibiotic medicines through an IV tube. These may be given for about 2 weeks. At first, the antibiotic that is given may kill most types of blood bacteria. If your test results show that a certain kind of bacteria is causing problems, the antibiotic may be changed to kill only the bacteria that are causing problems. °· Antibiotics taken by mouth. °· Removing any catheter or IV tube that is a source of infection. °· Blood pressure and breathing support, if needed. °· Surgery to control the source or spread of infection, if needed. °HOME CARE INSTRUCTIONS °· Take over-the-counter and prescription medicines only as told by your health care provider. °· If you were prescribed an antibiotic, take it as told by your health care provider. Do not stop taking the antibiotic even if you start to feel better. °· Rest at home until your condition is under control. °· Drink enough fluid to keep your urine clear or pale yellow. °· Keep all follow-up visits as told by your health care provider. This is important. °PREVENTION °Take these actions to help prevent future episodes of bacteremia: °· Get all vaccinations as recommended by your health care provider. °· Clean and cover scrapes or cuts. °· Bathe regularly. °· Wash your hands often. °· Before any dental or surgical procedure, ask your health care provider if you should take an antibiotic. °SEEK MEDICAL   CARE IF: °· Your symptoms get worse. °· You continue to have symptoms after treatment. °· You develop new symptoms after treatment. °SEEK IMMEDIATE MEDICAL CARE IF: °· You have chest pain or trouble breathing. °· You develop confusion, dizziness, or weakness. °· You develop pale skin. °  °This information is not intended to replace advice given to you by your health care provider. Make sure you discuss any questions you have  with your health care provider. °  °Document Released: 06/13/2006 Document Revised: 05/21/2015 Document Reviewed: 11/02/2014 °Elsevier Interactive Patient Education ©2016 Elsevier Inc. ° °

## 2016-05-21 NOTE — Discharge Summary (Signed)
Randy Woods., is a 35 y.o. male  DOB May 13, 1981  MRN [id number].  Admission date:  05/06/2016  Admitting Physician  Enedina Finner, MD  Discharge Date:  05/21/2016   [facility]  [name]  Recommendations for primary care physician for things to follow:   Follow-up of with. Dr. [name] in one month   Admission Diagnosis  Bacteremia [R78.81]   Discharge Diagnosis  Bacteremia [R78.81]    Principal Problem:   Bipolar 1 disorder, mixed, moderate (HCC) Active Problems:   Opiate abuse, continuous   Staphylococcal sepsis [facility])      Past Medical History:  Diagnosis Date  . Anxiety   . Bronchitis   . Cocaine abuse   . Depression   . Drug abuse   . Heroin abuse     Past Surgical History:  Procedure Laterality Date  . NOSE SURGERY    . TEE WITHOUT CARDIOVERSION N/A 05/10/2016   Procedure: TRANSESOPHAGEAL ECHOCARDIOGRAM (TEE);  Surgeon: [name], MD;  Location: [facility] ORS;  Service: Cardiovascular;  Laterality: N/A;  . TYMPANOPLASTY         History of present illness and  Hospital Course:     Kindly see H&P for history of present illness and admission details, please review complete Labs, Consult reports and Test reports for all details in brief  HPI  from the history and physical done on the day of admission 35 year old male patient noted on August 24 because of positive blood cultures. Patient was seen the day before in the emergency room for generalized body aches, low-grade fever at that time he received IV fluids, clindamycin, discharged home. And patient blood cultures came back +2 out of 2 so he is admitted to hospitalist service.   Hospital Course  #1 strep viridans bacteremia: Patient had poor IV access. PICC line is inserted, started on vancomycin, blood cultures showed staph aureus  with MSSA bacteremia so antibiotics were changed to nafcillin. Patient seen by ID Dr. [name]. Patient has history of drug abuse with IV  Heroin so we had to keep him in the hospital to finish the course of IV antibiotics and discontinue the PICC line. Patient finished 14 days of nafcillin on September 7 at 9 PM. And patient repeat blood cultures d  From August 24 are negative. Patient also had a TEE, TEE did not show any vegetation. EF 65%. A  2 history of substance abuse: Patient is on Suboxone, goes to  Clinic in [place] #3tobacco abuse;counseled to quit smoking.  Discharge Condition: stable   Follow UP  Follow-up Information    [name] up on 06/21/2016.   Specialty:  General Practice Why:  Monday at 9:00am for hospital follow-up Contact information: [address] [place] [address] [phone]        Mick Sell, MD Follow up in 1 month(s).   Specialty:  Infectious Diseases Contact information: [address] [facility] DISE[facility] [place] [address] [phone]             Discharge Instructions  and  Discharge Medications       Medication List    STOP taking these medications   amoxicillin 500 MG capsule Commonly known as:  AMOXIL   clindamycin 300 MG capsule Commonly known as:  CLEOCIN   predniSONE 10 MG tablet Commonly known as:  DELTASONE     TAKE these medications   buprenorphine-naloxone 8-2 MG Subl SL tablet Commonly  known as:  SUBOXONE Place 1 tablet under the tongue 2 (two) times daily.   ibuprofen 200 MG tablet Commonly known as:  ADVIL,MOTRIN Take 600 mg by mouth 2 (two) times daily.   QUEtiapine 100 MG tablet Commonly known as:  SEROQUEL Take 1 tablet (100 mg total) by mouth at bedtime.   ziprasidone 40 MG capsule Commonly known as:  GEODON Take 1 capsule (40 mg total) by mouth 2 (two) times daily with a meal.         Diet and Activity  recommendation: See Discharge Instructions above   Consults obtained -  Id,cardio   Major procedures and Radiology Reports - PLEASE review detailed and final reports for all details, in brief -      Dg Chest 2 View  Result Date: 05/06/2016 CLINICAL DATA:  Sepsis, fever for 3 days EXAM: CHEST  2 VIEW COMPARISON:  05/05/2016 FINDINGS: The heart size and mediastinal contours are within normal limits. Both lungs are clear. The visualized skeletal structures are unremarkable. IMPRESSION: No active cardiopulmonary disease. Electronically Signed   By: [name] M.D.   On: 05/06/2016 16:44   Dg Chest Portable 1 View  Result Date: 05/06/2016 CLINICAL DATA:  PICC placement. EXAM: PORTABLE CHEST 1 VIEW COMPARISON:  Frontal and lateral views earlier this day. FINDINGS: Tip of the right upper extremity PICC in the mid SVC. No pneumothorax. The cardiomediastinal contours are normal. The lungs are clear. Pulmonary vasculature is normal. No consolidation, pleural effusion, or pneumothorax. No acute osseous abnormalities are seen. IMPRESSION: Tip of the right upper extremity PICC in the mid SVC. No pneumothorax or acute abnormality. Electronically Signed   By: [name] M.D.   On: 05/06/2016 21:09   Dg Chest Port 1 View  Result Date: 05/05/2016 CLINICAL DATA:  Fever with chest pain and shortness of breath EXAM: PORTABLE CHEST 1 VIEW COMPARISON:  May 31, 2015 FINDINGS: Lungs are clear. The heart size and pulmonary vascularity are normal. No adenopathy. No bone lesions. IMPRESSION: No edema or consolidation. Electronically Signed   By: [name] M.D.   On: 05/05/2016 10:06    Micro Results    No results found for this or any previous visit (from the past 240 hour(s)).     Today   Subjective:   Randy Woods today has no headache,no chest abdominal pain,no new weakness tingling or numbness, feels much better wants to go home today.   Objective:   Blood pressure 134/80,  pulse 94, temperature 98.1 F (36.7 C), temperature source Oral, resp. rate 17, height 5\' 9"  (1.753 m), weight (!) 151.7 kg (334 lb 8 oz), SpO2 98 %.   Intake/Output Summary (Last 24 hours) at 05/21/16 1241 Last data filed at 05/21/16 0944  Gross per 24 hour  Intake              820 ml  Output             1800 ml  Net             -980 ml    Exam Awake Alert, Oriented x 3, No new F.N deficits, Normal affect [place].AT,PERRAL Supple Neck,No JVD, No cervical lymphadenopathy appriciated.  Symmetrical Chest wall movement, Good air movement bilaterally, CTAB RRR,No Gallops,Rubs or new Murmurs, No Parasternal Heave +ve B.Sounds, Abd Soft, Non tender, No organomegaly appriciated, No rebound -guarding or rigidity. No Cyanosis, Clubbing or edema, No new Rash or bruise  Data Review   CBC w Diff: Lab Results  Component Value Date   WBC 11.4 (H) 05/15/2016   HGB 14.3 05/15/2016   HGB 14.3 05/27/2014   HCT 40.2 05/15/2016   HCT 42.8 05/27/2014   PLT 161 05/15/2016   PLT 154 05/27/2014   LYMPHOPCT 22 05/06/2016   LYMPHOPCT 23.2 05/27/2014   MONOPCT 9 05/06/2016   MONOPCT 5.3 05/27/2014   EOSPCT 2 05/06/2016   EOSPCT 1.6 05/27/2014   BASOPCT 0 05/06/2016   BASOPCT 0.6 05/27/2014    CMP: Lab Results  Component Value Date   NA 138 05/09/2016   NA 140 05/27/2014   K 4.1 05/09/2016   K 3.8 05/27/2014   CL 105 05/09/2016   CL 107 05/27/2014   CO2 29 05/09/2016   CO2 31 05/27/2014   BUN 10 05/09/2016   BUN 11 05/27/2014   CREATININE 0.70 05/20/2016   CREATININE 0.92 05/27/2014   PROT 6.8 05/06/2016   PROT 7.5 09/16/2012   ALBUMIN 3.5 05/06/2016   ALBUMIN 3.9 09/16/2012   BILITOT 0.7 05/06/2016   BILITOT 0.3 09/16/2012   ALKPHOS 42 05/06/2016   ALKPHOS 70 09/16/2012   AST 38 05/06/2016   AST 24 09/16/2012   ALT 44 05/06/2016   ALT 50 09/16/2012  .   Total Time in preparing paper work, data evaluation and todays exam - 35 minutes  Camaya Gannett M.D on 05/21/2016 at  12:41 PM    Note: This dictation was prepared with Dragon dictation along with smaller phrase technology. Any transcriptional errors that result from this process are unintentional.

## 2016-05-21 NOTE — Progress Notes (Signed)
Patient A&O.  Pain medication given x1 with good relief.  PICC line removed without complications; patient tolerated well.  Per patient request contacted Dr. Luberta MutterKonidena regarding giving 1 p.m. dose of subutrex prior to discharge.  Dr. Luberta MutterKonidena denied request and patient declined to wait until after scheduled dose time for discharge.  Discharge instructions reviewed with patient and significant other.  Understanding was verbalized and all questions were answered.  Patient discharged home ambulatory escorted by nursing staff.

## 2016-09-30 ENCOUNTER — Emergency Department
Admission: EM | Admit: 2016-09-30 | Discharge: 2016-09-30 | Disposition: A | Discharge status: Home / Self Care | Payer: Medicaid Other | Attending: Emergency Medicine | Admitting: Emergency Medicine

## 2016-09-30 ENCOUNTER — Encounter: Payer: Self-pay | Admitting: Emergency Medicine

## 2016-09-30 DIAGNOSIS — F17220 Nicotine dependence, chewing tobacco, uncomplicated: Secondary | ICD-10-CM | POA: Diagnosis not present

## 2016-09-30 DIAGNOSIS — T401X1A Poisoning by heroin, accidental (unintentional), initial encounter: Secondary | ICD-10-CM | POA: Insufficient documentation

## 2016-09-30 DIAGNOSIS — F17210 Nicotine dependence, cigarettes, uncomplicated: Secondary | ICD-10-CM | POA: Diagnosis not present

## 2016-09-30 DIAGNOSIS — Z79899 Other long term (current) drug therapy: Secondary | ICD-10-CM | POA: Diagnosis not present

## 2016-09-30 MED ORDER — ACETAMINOPHEN 500 MG PO TABS
ORAL_TABLET | ORAL | Status: AC
  Administered 2016-09-30: 1000 mg via ORAL
  Filled 2016-09-30: qty 2

## 2016-09-30 MED ORDER — ACETAMINOPHEN 500 MG PO TABS
1000.0000 mg | ORAL_TABLET | Freq: Once | ORAL | Status: AC
  Administered 2016-09-30: 1000 mg via ORAL

## 2016-09-30 MED ORDER — CLONIDINE HCL 0.1 MG PO TABS: 0.1000 mg | ORAL_TABLET | Freq: Two times a day (BID) | ORAL | 0 refills | Status: DC

## 2016-09-30 MED ORDER — IBUPROFEN 800 MG PO TABS: 800.0000 mg | ORAL_TABLET | Freq: Three times a day (TID) | ORAL | 0 refills | Status: DC | PRN

## 2016-09-30 NOTE — ED Notes (Signed)
Pt given meal tray and coke with ice per EDP request. Pt updated on plan of care, awaiting TTS consult at this time.

## 2016-09-30 NOTE — ED Notes (Signed)

## 2016-09-30 NOTE — Discharge Instructions (Signed)
Please return immediately if condition worsens. Please contact her primary physician or the physician you were given for referral. If you have any specialist physicians involved in her treatment and plan please also contact them. Thank you for using [facility]. ° °

## 2016-09-30 NOTE — ED Provider Notes (Signed)
Time Seen: Approximately 1654  I have reviewed the triage notes  Chief Complaint: Drug Overdose   History of Present Illness: Randy FloodRichard L Burack Jr. is a 36 y.o. male who was transported here via EMS for a heroin overdose. Patient denies any associated other drugs. He states he doesn't feel as a long-lasting heroin. Patient was transported here by EMS after receiving 2 mg of Narcan intranasally. Patient describes a headache which he states he's had some early before in the past from his overdoses. He states he would like to get outpatient follow-up and drug treatment. He denies any suicidal thoughts, homicidal thoughts, hallucinations. He states he also gets some post heroin muscle cramps which she now has in both lower extremities. He is not aware of any fever.   Past Medical History:  Diagnosis Date  . Anxiety   . Bronchitis   . Cocaine abuse   . Depression   . Drug abuse   . Heroin abuse     Patient Active Problem List   Diagnosis Date Noted  . Bipolar 1 disorder, mixed, moderate (HCC) 05/07/2016  . Staphylococcal sepsis (HCC) 05/06/2016  . Severe recurrent major depression without psychotic features (HCC) 05/22/2015  . Opiate abuse, continuous 05/22/2015  . Cocaine abuse 05/22/2015    Past Surgical History:  Procedure Laterality Date  . NOSE SURGERY    . TEE WITHOUT CARDIOVERSION N/A 05/10/2016   Procedure: TRANSESOPHAGEAL ECHOCARDIOGRAM (TEE);  Surgeon: Laurier NancyShaukat A Khan, MD;  Location: [facility] ORS;  Service: Cardiovascular;  Laterality: N/A;  . TYMPANOPLASTY      Past Surgical History:  Procedure Laterality Date  . NOSE SURGERY    . TEE WITHOUT CARDIOVERSION N/A 05/10/2016   Procedure: TRANSESOPHAGEAL ECHOCARDIOGRAM (TEE);  Surgeon: Laurier NancyShaukat A Khan, MD;  Location: [facility] ORS;  Service: Cardiovascular;  Laterality: N/A;  . TYMPANOPLASTY      Current Outpatient Rx  . Order #: [id number] Class: Historical Med  . Order #: [id number] Class: Print  . Order #: [id number] Class: Print   . Order #: [id number] Class: Print  . Order #: [id number] Class: Print    Allergies:  Bactrim [sulfamethoxazole-trimethoprim] and Sulfa antibiotics  Family History: Family History  Problem Relation Age of Onset  . Diabetes Mother   . Hypertension Father   . Kidney failure Father   . Diabetes Father     Social History: Social History  Substance Use Topics  . Smoking status: Current Every Day Smoker    Packs/day: 1.50    Types: Cigarettes  . Smokeless tobacco: Current User    Types: Snuff  . Alcohol use Yes     Review of Systems:   10 point review of systems was performed and was otherwise negative:  Constitutional: No fever Eyes: No visual disturbances ENT: No sore throat, ear pain Cardiac: No chest pain Respiratory: No shortness of breath, wheezing, or stridor Abdomen: No abdominal pain, no vomiting, No diarrhea Endocrine: No weight loss, No night sweats Extremities: No peripheral edema, cyanosis Skin: No rashes, easy bruising Neurologic: No focal weakness, trouble with speech or swollowing Urologic: No dysuria, Hematuria, or urinary frequency   Physical Exam:  ED Triage Vitals  Enc Vitals Group     BP 09/30/16 1625 135/90     Pulse Rate 09/30/16 1625 99     Resp 09/30/16 1625 20     Temp 09/30/16 1625 98.8 F (37.1 C)     Temp Source 09/30/16 1625 Oral     SpO2 09/30/16 1625 97 %     Weight  09/30/16 1626 (!) 315 lb (142.9 kg)     Height 09/30/16 1626 5\' 10"  (1.778 m)     Head Circumference --      Peak Flow --      Pain Score 09/30/16 1626 10     Pain Loc --      Pain Edu? --      Excl. in GC? --     General: Awake , Alert , and Oriented times 3; GCS 15 Head: Normal cephalic , atraumatic Eyes: Pupils equal , round, reactive to light Nose/Throat: No nasal drainage, patent upper airway without erythema or exudate.  Neck: Supple, Full range of motion, No anterior adenopathy or palpable thyroid masses Lungs: Clear to ascultation without wheezes ,  rhonchi, or rales Heart: Regular rate, regular rhythm without murmurs , gallops , or rubs Abdomen: Soft, non tender without rebound, guarding , or rigidity; bowel sounds positive and symmetric in all 4 quadrants. No organomegaly .        Extremities: 2 plus symmetric pulses. No edema, clubbing or cyanosis Neurologic: normal ambulation, Motor symmetric without deficits, sensory intact Skin: warm, dry, no rashes Injection site of the right thumb was examined with no signs of cellulitis.   ED Course:  The patient was observed here for a period of time and had no relapse of his narcotic overdose. Patient is not suicidal, homicidal or having any hallucinations. He was given Tylenol for his physical complaints. Patient was seen and evaluated by TTS services and given information on outpatient drug treatment programs in the area.     Final Clinical Impression:   Final diagnoses:  Accidental overdose of heroin, initial encounter     Plan: * Patient was advised to return immediately if condition worsens. Patient was advised to follow up with their primary care physician or other specialized physicians involved in their outpatient care. The patient and/or family member/power of attorney had laboratory results reviewed at the bedside. All questions and concerns were addressed and appropriate discharge instructions were distributed by the nursing staff. " New Prescriptions   CLONIDINE (CATAPRES) 0.1 MG TABLET    Take 1 tablet (0.1 mg total) by mouth 2 (two) times daily.   IBUPROFEN (ADVIL,MOTRIN) 800 MG TABLET    Take 1 tablet (800 mg total) by mouth every 8 (eight) hours as needed.  "             Jennye Moccasin, MD 09/30/16 [phone]
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
## 2016-09-30 NOTE — ED Triage Notes (Addendum)
Pt presents to ED via [facility] s/p heroin overdose. Given 2mg  narcan intranasally by PD. Pt is currently A&Ox4, moves x4 extremities without difficulty. C/o generalized body aches/spasms, headache. Pt states "I know I'm dope sick and I need something for being dope sick. I'm okay with getting help as long as I GET HELP, you know what I'm saying?" Pt requesting suboxone by name, EDP at bedside.

## 2016-10-16 ENCOUNTER — Emergency Department
Admission: EM | Admit: 2016-10-16 | Discharge: 2016-10-16 | Disposition: A | Discharge status: Home / Self Care | Payer: Medicaid Other | Attending: Emergency Medicine | Admitting: Emergency Medicine

## 2016-10-16 ENCOUNTER — Encounter: Payer: Self-pay | Admitting: *Deleted

## 2016-10-16 DIAGNOSIS — F17210 Nicotine dependence, cigarettes, uncomplicated: Secondary | ICD-10-CM | POA: Diagnosis not present

## 2016-10-16 DIAGNOSIS — F17290 Nicotine dependence, other tobacco product, uncomplicated: Secondary | ICD-10-CM | POA: Diagnosis not present

## 2016-10-16 DIAGNOSIS — T401X1A Poisoning by heroin, accidental (unintentional), initial encounter: Secondary | ICD-10-CM | POA: Insufficient documentation

## 2016-10-16 NOTE — ED Notes (Signed)
Patient given ice water per Dr. Alphonzo LemmingsMcshane.

## 2016-10-16 NOTE — ED Notes (Signed)
Patient states he needs to be discharged, due to his only ride is out in the parking lot and he is concerned that they won't wait. Dr. Alphonzo LemmingsMcShane aware.

## 2016-10-16 NOTE — ED Provider Notes (Addendum)
[facility] Emergency Department Provider Note  ____________________________________________   I have reviewed the triage vital signs and the nursing notes.   HISTORY  Chief Complaint No chief complaint on file.    HPI Randy FloodRichard L Pickerill Jr. is a 36 y.o. male presents today complaining of a heroin overdose. Patient was shooting up heroin and passed out. He seemed Narcan from EMS and woke up. Was never pulseless. His hypoxic. Has done this before. No complaints. He did use narcotics between 10 and 11 this morning. Denies SI or HI. Wants to live.    Past Medical History:  Diagnosis Date  . Anxiety   . Bronchitis   . Cocaine abuse   . Depression   . Drug abuse   . Heroin abuse     Patient Active Problem List   Diagnosis Date Noted  . Bipolar 1 disorder, mixed, moderate (HCC) 05/07/2016  . Staphylococcal sepsis (HCC) 05/06/2016  . Severe recurrent major depression without psychotic features (HCC) 05/22/2015  . Opiate abuse, continuous 05/22/2015  . Cocaine abuse 05/22/2015    Past Surgical History:  Procedure Laterality Date  . NOSE SURGERY    . TEE WITHOUT CARDIOVERSION N/A 05/10/2016   Procedure: TRANSESOPHAGEAL ECHOCARDIOGRAM (TEE);  Surgeon: Laurier NancyShaukat A Khan, MD;  Location: [facility] ORS;  Service: Cardiovascular;  Laterality: N/A;  . TYMPANOPLASTY      Prior to Admission medications   Medication Sig Start Date End Date Taking? Authorizing Provider  buprenorphine-naloxone (SUBOXONE) 8-2 MG SUBL SL tablet Place 1 tablet under the tongue 2 (two) times daily.    Historical Provider, MD  cloNIDine (CATAPRES) 0.1 MG tablet Take 1 tablet (0.1 mg total) by mouth 2 (two) times daily. 09/30/16 10/07/16  Jennye MoccasinBrian S Quigley, MD  ibuprofen (ADVIL,MOTRIN) 800 MG tablet Take 1 tablet (800 mg total) by mouth every 8 (eight) hours as needed. 09/30/16   Jennye MoccasinBrian S Quigley, MD  QUEtiapine (SEROQUEL) 100 MG tablet Take 1 tablet (100 mg total) by mouth at bedtime. Patient not  taking: Reported on 05/06/2016 05/23/15   Audery AmelJohn T Clapacs, MD  ziprasidone (GEODON) 40 MG capsule Take 1 capsule (40 mg total) by mouth 2 (two) times daily with a meal. Patient not taking: Reported on 05/06/2016 05/23/15   Audery AmelJohn T Clapacs, MD    Allergies Bactrim [sulfamethoxazole-trimethoprim] and Sulfa antibiotics  Family History  Problem Relation Age of Onset  . Diabetes Mother   . Hypertension Father   . Kidney failure Father   . Diabetes Father     Social History Social History  Substance Use Topics  . Smoking status: Current Every Day Smoker    Packs/day: 1.50    Types: Cigarettes  . Smokeless tobacco: Current User    Types: Snuff  . Alcohol use Yes    Review of Systems Constitutional: No fever/chills Eyes: No visual changes. ENT: No sore throat. No stiff neck no neck pain Cardiovascular: Denies chest pain. Respiratory: Denies shortness of breath. Gastrointestinal:   no vomiting.  No diarrhea.  No constipation. Genitourinary: Negative for dysuria. Musculoskeletal: Negative lower extremity swelling Skin: Negative for rash. Neurological: Negative for severe headaches, focal weakness or numbness. 10-point ROS otherwise negative.  ____________________________________________   PHYSICAL EXAM:  VITAL SIGNS: ED Triage Vitals  Enc Vitals Group     BP      Pulse      Resp      Temp      Temp src      SpO2  Weight      Height      Head Circumference      Peak Flow      Pain Score      Pain Loc      Pain Edu?      Excl. in GC?     Constitutional: Alert and oriented. Well appearing and in no acute distress. Eyes: Conjunctivae are normal. PERRL. EOMI. Head: Atraumatic. Nose: No congestion/rhinnorhea. Mouth/Throat: Mucous membranes are moist.  Oropharynx non-erythematous. Neck: No stridor.   Nontender with no meningismus Cardiovascular: Normal rate, regular rhythm. Grossly normal heart sounds.  Good peripheral circulation. Respiratory: Normal respiratory  effort.  No retractions. Lungs CTAB. Abdominal: Soft and nontender. No distention. No guarding no rebound Back:  There is no focal tenderness or step off.  there is no midline tenderness there are no lesions noted. there is no CVA tenderness Musculoskeletal: No lower extremity tenderness, no upper extremity tenderness. No joint effusions, no DVT signs strong distal pulses no edema Neurologic:  Normal speech and language. No gross focal neurologic deficits are appreciated.  Skin:  Skin is warm, dry and intact. No rash noted. Psychiatric: Mood and affect are normal. Speech and behavior are normal.  ____________________________________________   LABS (all labs ordered are listed, but only abnormal results are displayed)  Labs Reviewed - No data to display ____________________________________________  EKG  I personally interpreted any EKGs ordered by me or triage nsr reate 84 nos ste no std ____________________________________________  RADIOLOGY  I reviewed any imaging ordered by me or triage that were performed during my shift and, if possible, patient and/or family made aware of any abnormal findings. ____________________________________________   PROCEDURES  Procedure(s) performed: None  Procedures  Critical Care performed: None  ____________________________________________   INITIAL IMPRESSION / ASSESSMENT AND PLAN / ED COURSE  Pertinent labs & imaging results that were available during my care of the patient were reviewed by me and considered in my medical decision making (see chart for details).  He would repeat a narcotic use report resulting in repeated overdose and repeated Narcan administration with repeated stating of his life. He denies any SI or HI he is not trying to kill himself. We will observe the patient in the emergency room  ----------------------------------------- 3:36 PM on 10/16/2016 -----------------------------------------  No si or hi. I asked  psychiatry to come see pt, however, pt states his ride is here. He is insisting on leaving. He has no thoughts of hurting himself or anyone else he is calm: Collected he understands he has an addiction issue and he will seek help as an outpatient as he sees fit however he refuses voluntarily to stay here and I do not have criteria to force him to against his will..  ----------------------------------------- 3:47 PM on 10/16/2016 -----------------------------------------  At no time was the patient somnolent here. He understands there is some risk to going home but after this degree of observation, his percussible going.   ____________________________________________   FINAL CLINICAL IMPRESSION(S) / ED DIAGNOSES  Final diagnoses:  None      This chart was dictated using voice recognition software.  Despite best efforts to proofread,  errors can occur which can change meaning.      Jeanmarie Plant, MD 10/16/16 1203    Jeanmarie Plant, MD 10/16/16 1539    Jeanmarie Plant, MD 10/16/16 [phone]

## 2016-10-16 NOTE — ED Notes (Signed)
Patient's mother called and wanted her son to be involuntarily committed. Dr. Alphonzo LemmingsMcShane aware.

## 2016-10-16 NOTE — ED Notes (Signed)
Patient given peanut butter and graham crackers.  

## 2016-10-16 NOTE — ED Triage Notes (Signed)
Per EMS report, family called EMS for patient being unresponsive. Patient is known heroin user and had used a new dealer today. EMS report that upon their arrival, patient was blue in the face, abdominal lividity present, respirations approximately 4, pulse ox was in the 70's. Patient was given 4mg  Narcan intranasally and 1 mg Narcan IM. Patient became more alert, pulse ox was 96%, b/P was 91/68. Patient is alert, tearful upon arrival to the ED.

## 2016-11-11 ENCOUNTER — Encounter: Payer: Self-pay | Admitting: Emergency Medicine

## 2016-11-11 ENCOUNTER — Emergency Department: Payer: Medicaid Other

## 2016-11-11 ENCOUNTER — Emergency Department
Admission: EM | Admit: 2016-11-11 | Discharge: 2016-11-12 | Disposition: A | Discharge status: Home / Self Care | Payer: Medicaid Other | Attending: Emergency Medicine | Admitting: Emergency Medicine

## 2016-11-11 DIAGNOSIS — M25552 Pain in left hip: Secondary | ICD-10-CM

## 2016-11-11 DIAGNOSIS — T401X1A Poisoning by heroin, accidental (unintentional), initial encounter: Secondary | ICD-10-CM | POA: Diagnosis not present

## 2016-11-11 DIAGNOSIS — T40601A Poisoning by unspecified narcotics, accidental (unintentional), initial encounter: Secondary | ICD-10-CM | POA: Diagnosis not present

## 2016-11-11 DIAGNOSIS — F17210 Nicotine dependence, cigarettes, uncomplicated: Secondary | ICD-10-CM | POA: Diagnosis not present

## 2016-11-11 DIAGNOSIS — Y939 Activity, unspecified: Secondary | ICD-10-CM | POA: Diagnosis not present

## 2016-11-11 DIAGNOSIS — M545 Low back pain, unspecified: Secondary | ICD-10-CM

## 2016-11-11 DIAGNOSIS — F17290 Nicotine dependence, other tobacco product, uncomplicated: Secondary | ICD-10-CM | POA: Diagnosis not present

## 2016-11-11 DIAGNOSIS — Y92012 Bathroom of single-family (private) house as the place of occurrence of the external cause: Secondary | ICD-10-CM | POA: Diagnosis not present

## 2016-11-11 DIAGNOSIS — Y999 Unspecified external cause status: Secondary | ICD-10-CM | POA: Diagnosis not present

## 2016-11-11 DIAGNOSIS — W19XXXA Unspecified fall, initial encounter: Secondary | ICD-10-CM | POA: Diagnosis not present

## 2016-11-11 MED ORDER — SODIUM CHLORIDE 0.9 % IV BOLUS (SEPSIS)
1000.0000 mL | Freq: Once | INTRAVENOUS | Status: AC
  Administered 2016-11-12: 1000 mL via INTRAVENOUS

## 2016-11-11 MED ORDER — LORAZEPAM 2 MG/ML IJ SOLN
1.0000 mg | Freq: Once | INTRAMUSCULAR | Status: AC
  Administered 2016-11-11: 1 mg via INTRAVENOUS
  Filled 2016-11-11: qty 1

## 2016-11-11 NOTE — ED Provider Notes (Signed)
[facility] Emergency Department Provider Note   ____________________________________________   First MD Initiated Contact with Patient 11/11/16 2305     (approximate)  I have reviewed the triage vital signs and the nursing notes.   HISTORY  Chief Complaint Drug Overdose  Limited by drug use  HPI Randy Woods. is a 36 y.o. male brought to the ED from home with a chief complaint of apparent overdose. Patient admits to regular use of IV heroin. He believes he overdosed and fell in his bathroom. Complains of lower back and left hip pain. Patient was given 6 mg of Narcan at the scene. Reportedly thrashed around and fought first responders, thereby suffering a bloody nose. Patient unsure if he suffer loss of consciousness. Denies headache, neck pain, chest pain, shortness of breath, abdominal pain, vomiting, diarrhea. Denies numbness, tingling, extremity weakness, bowel or bladder incontinence. Nothing makes his symptoms better; movement makes his symptoms worse.Denies suicide attempt. Denies self-harm. Denies HI/AH/VH.   Past Medical History:  Diagnosis Date  . Anxiety   . Bronchitis   . Cocaine abuse   . Depression   . Drug abuse   . Heroin abuse     Patient Active Problem List   Diagnosis Date Noted  . Bipolar 1 disorder, mixed, moderate (HCC) 05/07/2016  . Staphylococcal sepsis (HCC) 05/06/2016  . Severe recurrent major depression without psychotic features (HCC) 05/22/2015  . Opiate abuse, continuous 05/22/2015  . Cocaine abuse 05/22/2015    Past Surgical History:  Procedure Laterality Date  . NOSE SURGERY    . TEE WITHOUT CARDIOVERSION N/A 05/10/2016   Procedure: TRANSESOPHAGEAL ECHOCARDIOGRAM (TEE);  Surgeon: [name], MD;  Location: [facility] ORS;  Service: Cardiovascular;  Laterality: N/A;  . TYMPANOPLASTY      Prior to Admission medications   Medication Sig Start Date End Date Taking? Authorizing Provider  buprenorphine-naloxone  (SUBOXONE) 8-2 MG SUBL SL tablet Place 1 tablet under the tongue 2 (two) times daily.    Historical Provider, MD  cloNIDine (CATAPRES) 0.1 MG tablet Take 1 tablet (0.1 mg total) by mouth 2 (two) times daily. 09/30/16 10/07/16  Jennye Moccasin, MD  ibuprofen (ADVIL,MOTRIN) 800 MG tablet Take 1 tablet (800 mg total) by mouth every 8 (eight) hours as needed. 09/30/16   Jennye Moccasin, MD  QUEtiapine (SEROQUEL) 100 MG tablet Take 1 tablet (100 mg total) by mouth at bedtime. Patient not taking: Reported on 05/06/2016 05/23/15   Audery Amel, MD  ziprasidone (GEODON) 40 MG capsule Take 1 capsule (40 mg total) by mouth 2 (two) times daily with a meal. Patient not taking: Reported on 05/06/2016 05/23/15   Audery Amel, MD    Allergies Bactrim [sulfamethoxazole-trimethoprim] and Sulfa antibiotics  Family History  Problem Relation Age of Onset  . Diabetes Mother   . Hypertension Father   . Kidney failure Father   . Diabetes Father     Social History Social History  Substance Use Topics  . Smoking status: Current Every Day Smoker    Packs/day: 1.50    Types: Cigarettes  . Smokeless tobacco: Current User    Types: Snuff  . Alcohol use Yes    Review of Systems  Constitutional: No fever/chills. Eyes: No visual changes. ENT: No sore throat. Cardiovascular: Denies chest pain. Respiratory: Denies shortness of breath. Gastrointestinal: No abdominal pain.  No nausea, no vomiting.  No diarrhea.  No constipation. Genitourinary: Negative for dysuria. Musculoskeletal: Positive for back and left hip pain. Skin:  Negative for rash. Neurological: Negative for headaches, focal weakness or numbness.  10-point ROS otherwise negative.  ____________________________________________   PHYSICAL EXAM:  VITAL SIGNS: ED Triage Vitals  Enc Vitals Group     BP 11/11/16 2250 (!) 168/131     Pulse Rate 11/11/16 2250 95     Resp 11/11/16 2250 17     Temp 11/11/16 2250 97.9 F (36.6 C)     Temp Source  11/11/16 2250 Oral     SpO2 11/11/16 2250 99 %     Weight 11/11/16 2303 (!) 315 lb (142.9 kg)     Height 11/11/16 2303 5\' 10"  (1.778 m)     Head Circumference --      Peak Flow --      Pain Score 11/11/16 2303 10     Pain Loc --      Pain Edu? --      Excl. in GC? --     Constitutional: Alert and oriented. Well appearing and in mild acute distress. Yelling. Eyes: Conjunctivae are normal. PERRL. EOMI. Head: Atraumatic. Nose: Dried blood in both nares. Mouth/Throat: Mucous membranes are moist.  Oropharynx non-erythematous. Neck: No stridor.  No cervical spine tenderness to palpation.  No step-offs or deformities noted. Cardiovascular: Normal rate, regular rhythm. Grossly normal heart sounds.  Good peripheral circulation. Respiratory: Normal respiratory effort.  No retractions. Lungs CTAB. Gastrointestinal: Soft and nontender. No distention. No abdominal bruits. No CVA tenderness. Musculoskeletal: Lumbar spine tender to palpation without step-offs or deformities. Stable pelvis. Left hip tender to palpation. No external rotation or shortening. Neurologic:  Normal speech and language. No gross focal neurologic deficits are appreciated.  Skin:  Skin is warm, dry and intact. No rash noted. Psychiatric: Mood and affect are anxious. Speech and behavior are normal.  ____________________________________________   LABS (all labs ordered are listed, but only abnormal results are displayed)  Labs Reviewed  COMPREHENSIVE METABOLIC PANEL - Abnormal; Notable for the following:       Result Value   Calcium 8.0 (*)    AST 59 (*)    ALT 72 (*)    All other components within normal limits  ACETAMINOPHEN LEVEL - Abnormal; Notable for the following:    Acetaminophen (Tylenol), Serum <10 (*)    All other components within normal limits  ETHANOL  SALICYLATE LEVEL  CBC WITH DIFFERENTIAL/PLATELET  CBC WITH DIFFERENTIAL/PLATELET   ____________________________________________  EKG  ED ECG  REPORT I, Jordane Hisle J, the attending physician, personally viewed and interpreted this ECG.   Date: 11/11/2016  EKG Time: 2249  Rate: 94  Rhythm: normal EKG, normal sinus rhythm  Axis: Normal  Intervals:none  ST&T Change: Nonspecific  ____________________________________________  RADIOLOGY  CT head interpreted per Dr. Sterling BigKwon: No acute intracranial abnormality.  Chest 1 view interpreted per Dr. Cherly Hensenhang: No acute cardiopulmonary process seen. No displaced rib fractures  identified.   Left hip x-rays interpreted per Dr. Cherly Hensenhang: No evidence of fracture or dislocation.  Lumbar spine x-rays interpreted per Dr. Cherly Hensenhang: No evidence of fracture or subluxation along the lumbar spine. ____________________________________________   PROCEDURES  Procedure(s) performed: None  Procedures  Critical Care performed: No  ____________________________________________   INITIAL IMPRESSION / ASSESSMENT AND PLAN / ED COURSE  Pertinent labs & imaging results that were available during my care of the patient were reviewed by me and considered in my medical decision making (see chart for details).  36 year old male who presents status post heroin overdose with lower back and left hip pain. Unknown LOC.  Will proceed with CT and plain film imaging studies of head, neck, lower back and left hip. IV benzodiazepine given for both anxiety and discomfort.  Clinical Course as of Nov 13 230  Fri Nov 12, 2016  0227 Patient ambulating without difficulty. Has called family for transportation home. He is awake and alert. Updated him of imaging results. Strict return precautions given. Patient verbalizes understanding and agrees with plan of care.  [JS]    Clinical Course User Index [JS] Irean Hong, MD     ____________________________________________   FINAL CLINICAL IMPRESSION(S) / ED DIAGNOSES  Final diagnoses:  Accidental overdose of heroin, initial encounter  Lumbosacral pain  Left hip pain       NEW MEDICATIONS STARTED DURING THIS VISIT:  New Prescriptions   No medications on file     Note:  This document was prepared using Dragon voice recognition software and may include unintentional dictation errors.    Irean Hong, MD 11/12/16 [phone]

## 2016-11-11 NOTE — ED Triage Notes (Signed)
Pt comes into the ED via EMS from home w/ c/o heroin overdose.  Patient was given 6 of narcan.  Found in small 1/2 bath and unknown how long he was there.  Patient has pulses upon EMS arrival.  Patient has significant IV drug use history.  Patient in NAD at this time with even and unlabored respirations. CBG 197, VS stable.

## 2016-11-11 NOTE — ED Notes (Signed)
Lab at bedside, only able to get red tube, states will return after pt goes to CT to finger stick for rest

## 2016-11-12 ENCOUNTER — Emergency Department (EMERGENCY_DEPARTMENT_HOSPITAL)
Admission: EM | Admit: 2016-11-12 | Discharge: 2016-11-12 | Disposition: A | Discharge status: Home / Self Care | Payer: Medicaid Other | Source: Home / Self Care | Attending: Emergency Medicine | Admitting: Emergency Medicine

## 2016-11-12 ENCOUNTER — Encounter: Payer: Self-pay | Admitting: Emergency Medicine

## 2016-11-12 DIAGNOSIS — Y999 Unspecified external cause status: Secondary | ICD-10-CM | POA: Insufficient documentation

## 2016-11-12 DIAGNOSIS — F17290 Nicotine dependence, other tobacco product, uncomplicated: Secondary | ICD-10-CM | POA: Insufficient documentation

## 2016-11-12 DIAGNOSIS — Y939 Activity, unspecified: Secondary | ICD-10-CM | POA: Insufficient documentation

## 2016-11-12 DIAGNOSIS — F17210 Nicotine dependence, cigarettes, uncomplicated: Secondary | ICD-10-CM | POA: Insufficient documentation

## 2016-11-12 DIAGNOSIS — W19XXXA Unspecified fall, initial encounter: Secondary | ICD-10-CM | POA: Insufficient documentation

## 2016-11-12 DIAGNOSIS — F1410 Cocaine abuse, uncomplicated: Secondary | ICD-10-CM | POA: Diagnosis present

## 2016-11-12 DIAGNOSIS — F1910 Other psychoactive substance abuse, uncomplicated: Secondary | ICD-10-CM

## 2016-11-12 DIAGNOSIS — T404X1A Poisoning by other synthetic narcotics, accidental (unintentional), initial encounter: Secondary | ICD-10-CM

## 2016-11-12 DIAGNOSIS — T401X1A Poisoning by heroin, accidental (unintentional), initial encounter: Secondary | ICD-10-CM | POA: Insufficient documentation

## 2016-11-12 DIAGNOSIS — M25552 Pain in left hip: Secondary | ICD-10-CM | POA: Insufficient documentation

## 2016-11-12 DIAGNOSIS — Y92012 Bathroom of single-family (private) house as the place of occurrence of the external cause: Secondary | ICD-10-CM | POA: Insufficient documentation

## 2016-11-12 DIAGNOSIS — T40601A Poisoning by unspecified narcotics, accidental (unintentional), initial encounter: Secondary | ICD-10-CM | POA: Diagnosis not present

## 2016-11-12 DIAGNOSIS — M545 Low back pain: Secondary | ICD-10-CM | POA: Insufficient documentation

## 2016-11-12 DIAGNOSIS — F1110 Opioid abuse, uncomplicated: Secondary | ICD-10-CM | POA: Diagnosis present

## 2016-11-12 DIAGNOSIS — T40411A Poisoning by fentanyl or fentanyl analogs, accidental (unintentional), initial encounter: Secondary | ICD-10-CM

## 2016-11-12 LAB — ACETAMINOPHEN LEVEL
Acetaminophen (Tylenol), Serum: <10 ug/mL — ABNORMAL LOW (ref 10–30)
Acetaminophen (Tylenol), Serum: <10 ug/mL — ABNORMAL LOW (ref 10–30)

## 2016-11-12 LAB — COMPREHENSIVE METABOLIC PANEL
ALT: 72 U/L — ABNORMAL HIGH (ref 17–63)
ALT: 113 U/L — ABNORMAL HIGH (ref 17–63)
AST: 59 U/L — ABNORMAL HIGH (ref 15–41)
AST: 299 U/L — ABNORMAL HIGH (ref 15–41)
Albumin: 3.7 g/dL (ref 3.5–5.0)
Albumin: 3.8 g/dL (ref 3.5–5.0)
Alkaline Phosphatase: 53 U/L (ref 38–126)
Alkaline Phosphatase: 54 U/L (ref 38–126)
Anion gap: 9 (ref 5–15)
Anion gap: 12 (ref 5–15)
BUN: 11 mg/dL (ref 6–20)
BUN: 18 mg/dL (ref 6–20)
CO2: 23 mmol/L (ref 22–32)
CO2: 24 mmol/L (ref 22–32)
Calcium: 8 mg/dL — ABNORMAL LOW (ref 8.9–10.3)
Calcium: 8.3 mg/dL — ABNORMAL LOW (ref 8.9–10.3)
Chloride: 104 mmol/L (ref 101–111)
Chloride: 101 mmol/L (ref 101–111)
Creatinine, Ser: 1.06 mg/dL (ref 0.61–1.24)
Creatinine, Ser: 1.31 mg/dL — ABNORMAL HIGH (ref 0.61–1.24)
GFR calc Af Amer: >60 mL/min (ref >60)
GFR calc Af Amer: >60 mL/min (ref >60)
GFR calc non Af Amer: >60 mL/min (ref >60)
GFR calc non Af Amer: >60 mL/min (ref >60)
Glucose, Bld: 82 mg/dL (ref 65–99)
Glucose, Bld: 151 mg/dL — ABNORMAL HIGH (ref 65–99)
Potassium: 4.3 mmol/L (ref 3.5–5.1)
Potassium: 4.2 mmol/L (ref 3.5–5.1)
Sodium: 136 mmol/L (ref 135–145)
Sodium: 137 mmol/L (ref 135–145)
Total Bilirubin: 0.5 mg/dL (ref 0.3–1.2)
Total Bilirubin: 0.9 mg/dL (ref 0.3–1.2)
Total Protein: 7.1 g/dL (ref 6.5–8.1)
Total Protein: 6.9 g/dL (ref 6.5–8.1)

## 2016-11-12 LAB — CBC
HCT: 40.8 % (ref 40.0–52.0)
Hemoglobin: 13.7 g/dL (ref 13.0–18.0)
MCH: 29.2 pg (ref 26.0–34.0)
MCHC: 33.5 g/dL (ref 32.0–36.0)
MCV: 87.2 fL (ref 80.0–100.0)
Platelets: 150 10*3/uL (ref 150–440)
RBC: 4.68 MIL/uL (ref 4.40–5.90)
RDW: 13.5 % (ref 11.5–14.5)
WBC: 8.4 10*3/uL (ref 3.8–10.6)

## 2016-11-12 LAB — ETHANOL
Alcohol, Ethyl (B): <5 mg/dL (ref <5)
Alcohol, Ethyl (B): <5 mg/dL (ref <5)

## 2016-11-12 LAB — URINE DRUG SCREEN, QUALITATIVE (ARMC ONLY)
Amphetamines, Ur Screen: NOT DETECTED
Barbiturates, Ur Screen: NOT DETECTED
Benzodiazepine, Ur Scrn: POSITIVE — AB
Cannabinoid 50 Ng, Ur ~~LOC~~: POSITIVE — AB
Cocaine Metabolite,Ur ~~LOC~~: POSITIVE — AB
MDMA (Ecstasy)Ur Screen: NOT DETECTED
Methadone Scn, Ur: POSITIVE — AB
Opiate, Ur Screen: POSITIVE — AB
Phencyclidine (PCP) Ur S: NOT DETECTED
Tricyclic, Ur Screen: NOT DETECTED

## 2016-11-12 LAB — SALICYLATE LEVEL
Salicylate Lvl: <7 mg/dL (ref 2.8–30.0)
Salicylate Lvl: <7 mg/dL (ref 2.8–30.0)

## 2016-11-12 MED ORDER — SODIUM CHLORIDE 0.9 % IV BOLUS (SEPSIS): 500.0000 mL | Freq: Once | INTRAVENOUS | Status: DC

## 2016-11-12 MED ORDER — KETOROLAC TROMETHAMINE 30 MG/ML IJ SOLN
30.0000 mg | Freq: Once | INTRAMUSCULAR | Status: AC
  Administered 2016-11-12: 30 mg via INTRAVENOUS
  Filled 2016-11-12: qty 1

## 2016-11-12 MED ORDER — NALOXONE HCL 4 MG/0.1ML NA LIQD
1.0000 | Freq: Once | NASAL | Status: AC
  Administered 2016-11-12: 1 via NASAL
  Filled 2016-11-12: qty 4

## 2016-11-12 MED ORDER — SODIUM CHLORIDE 0.9 % IV BOLUS (SEPSIS)
1000.0000 mL | Freq: Once | INTRAVENOUS | Status: AC
  Administered 2016-11-12: 1000 mL via INTRAVENOUS

## 2016-11-12 NOTE — Progress Notes (Addendum)
LCSW consulted with Dr Toni Amendlapacs. He will assess this patient shortly  Randy SenateClaudine Tyreon Frigon LCSW [phone]

## 2016-11-12 NOTE — Discharge Instructions (Addendum)
Please seek help for your substance abuse. Return to the emergency department immediately for any concern for depression, suicidal ideation, or overdose.  You can obtain a Narcan medication at any pharmacy without a prescription.

## 2016-11-12 NOTE — Progress Notes (Signed)
LCSW met with patient he reported he is not suicidal or homicidal he just makes bad choices. Resources provided [facility]- Civil engineer, contracting, [employer] ( explained area [employer]) Shelter, Treatment, addiction issues) He was in good spirits and hopes to make better choices. As per Dr Weber Cooks this patient will DC home.   No further needs   [employer] LCSW [phone]

## 2016-11-12 NOTE — ED Provider Notes (Signed)
[facility] Emergency Department Provider Note   ____________________________________________   First MD Initiated Contact with Patient 11/12/16 1238     (approximate)  I have reviewed the triage vital signs and the nursing notes.   HISTORY  Chief Complaint "fentanyl"  HPI Randy Woods. is a 36 y.o. male tells me that he is be here because he intended and shoot heroin, but he is overdosed twice now in 24 hours and thinks it was in fact fentanyl.  He shoots heroin on a regular basis, using about $20 per bag. He's been shooting for several years. He reports that he was using today, was found unresponsive. He remembers waking up in the care of paramedics who brought him here because of concerns of recurrent overdoses and the patient was found almost an arrest requiring bag mouth ventilation  He reports no concerns. Patient reports he knows that this could actually kill him, but he is a long-standing user. Denies any other medical issue. Denies any desire to harm himself or anyone else. He reports he was using to get high.   Past Medical History:  Diagnosis Date  . Anxiety   . Bronchitis   . Cocaine abuse   . Depression   . Drug abuse   . Heroin abuse     Patient Active Problem List   Diagnosis Date Noted  . Bipolar 1 disorder, mixed, moderate (HCC) 05/07/2016  . Staphylococcal sepsis (HCC) 05/06/2016  . Severe recurrent major depression without psychotic features (HCC) 05/22/2015  . Opiate abuse, continuous 05/22/2015  . Cocaine abuse 05/22/2015    Past Surgical History:  Procedure Laterality Date  . NOSE SURGERY    . TEE WITHOUT CARDIOVERSION N/A 05/10/2016   Procedure: TRANSESOPHAGEAL ECHOCARDIOGRAM (TEE);  Surgeon: [name], MD;  Location: [facility] ORS;  Service: Cardiovascular;  Laterality: N/A;  . TYMPANOPLASTY      Prior to Admission medications   Medication Sig Start Date End Date Taking? Authorizing Provider    buprenorphine-naloxone (SUBOXONE) 8-2 MG SUBL SL tablet Place 1 tablet under the tongue 2 (two) times daily.    Historical Provider, MD  cloNIDine (CATAPRES) 0.1 MG tablet Take 1 tablet (0.1 mg total) by mouth 2 (two) times daily. 09/30/16 10/07/16  Jennye Moccasin, MD  ibuprofen (ADVIL,MOTRIN) 800 MG tablet Take 1 tablet (800 mg total) by mouth every 8 (eight) hours as needed. 09/30/16   Jennye Moccasin, MD  QUEtiapine (SEROQUEL) 100 MG tablet Take 1 tablet (100 mg total) by mouth at bedtime. Patient not taking: Reported on 05/06/2016 05/23/15   Audery Amel, MD  ziprasidone (GEODON) 40 MG capsule Take 1 capsule (40 mg total) by mouth 2 (two) times daily with a meal. Patient not taking: Reported on 05/06/2016 05/23/15   Audery Amel, MD    Allergies Bactrim [sulfamethoxazole-trimethoprim] and Sulfa antibiotics  Family History  Problem Relation Age of Onset  . Diabetes Mother   . Hypertension Father   . Kidney failure Father   . Diabetes Father     Social History Social History  Substance Use Topics  . Smoking status: Current Every Day Smoker    Packs/day: 1.50    Types: Cigarettes  . Smokeless tobacco: Current User    Types: Snuff  . Alcohol use Yes    Review of Systems Constitutional: No fever/chills Eyes: No visual changes. ENT: No sore throat. Cardiovascular: Denies chest pain. Respiratory: Denies shortness of breath. Gastrointestinal: No abdominal pain.  No nausea, no  vomiting.  No diarrhea.  No constipation. Genitourinary: Negative for dysuria. Musculoskeletal: Negative for back pain. Skin: Negative for rash. Neurological: Negative for headaches, focal weakness or numbness.  10-point ROS otherwise negative.  ____________________________________________   PHYSICAL EXAM:  VITAL SIGNS: ED Triage Vitals  Enc Vitals Group     BP 11/12/16 1226 127/80     Pulse Rate 11/12/16 1224 89     Resp 11/12/16 1224 16     Temp 11/12/16 1224 98.1 F (36.7 C)     Temp Source  11/12/16 1224 Oral     SpO2 11/12/16 1224 100 %     Weight 11/12/16 1224 300 lb (136.1 kg)     Height 11/12/16 1224 5\' 9"  (1.753 m)     Head Circumference --      Peak Flow --      Pain Score 11/12/16 1225 10     Pain Loc --      Pain Edu? --      Excl. in GC? --     Constitutional: Alert and oriented. Well appearing and in no acute distress. Eyes: Conjunctivae are normal. PERRL. EOMI. Head: Atraumatic. Nose: No congestion/rhinnorhea. Mouth/Throat: Mucous membranes are Slightly dry.  Oropharynx non-erythematous. Neck: No stridor.   Cardiovascular: Normal rate, regular rhythm. Grossly normal heart sounds.  Good peripheral circulation. Respiratory: Normal respiratory effort.  No retractions. Lungs CTAB. Gastrointestinal: Soft and nontender.  Musculoskeletal: No lower extremity tenderness nor edema.  No joint effusions. Neurologic:  Normal speech and language. No gross focal neurologic deficits are appreciated. No gait instability. Skin:  Skin is warm, dry and intact. No rash noted. Psychiatric: Mood and affect are normal. Speech and behavior are normal. No suicidal ideation. Appears remorseful for his overdose.  ____________________________________________   LABS (all labs ordered are listed, but only abnormal results are displayed)  Labs Reviewed  COMPREHENSIVE METABOLIC PANEL - Abnormal; Notable for the following:       Result Value   Glucose, Bld 151 (*)    Creatinine, Ser 1.31 (*)    Calcium 8.3 (*)    AST 299 (*)    ALT 113 (*)    All other components within normal limits  ACETAMINOPHEN LEVEL - Abnormal; Notable for the following:    Acetaminophen (Tylenol), Serum <10 (*)    All other components within normal limits  URINE DRUG SCREEN, QUALITATIVE ([facility] ONLY) - Abnormal; Notable for the following:    Cocaine Metabolite,Ur [place] POSITIVE (*)    Opiate, Ur Screen POSITIVE (*)    Cannabinoid 50 Ng, Ur [place] POSITIVE (*)    Benzodiazepine, Ur Scrn POSITIVE (*)    Methadone  Scn, Ur POSITIVE (*)    All other components within normal limits  ETHANOL  SALICYLATE LEVEL  CBC  CBG MONITORING, ED   ____________________________________________  EKG  Reviewed and interpreted by me at 12:40 AM Heart rate 90 QRS 100 QTc 460 Normal sinus rhythm, slight artifact is noted, no evidence of acute ischemia. ____________________________________________  RADIOLOGY   ____________________________________________   PROCEDURES  Procedure(s) performed: None  Procedures  Critical Care performed: No  ____________________________________________   INITIAL IMPRESSION / ASSESSMENT AND PLAN / ED COURSE  Pertinent labs & imaging results that were available during my care of the patient were reviewed by me and considered in my medical decision making (see chart for details).  ----------------------------------------- 1:16 PM on 11/12/2016 -----------------------------------------  Patient resting comfortably. No complaints. Watching television alert and oriented. Stable hemodynamics. Patient placed under involuntary commitment by  [facility] [employer]. I have placed a consult to psychiatry, and we'll transfer him to tear of the A side emergency physician Dr. Shaune Pollack at this time.    Patient has not recently dated after one hour plus from receiving naloxone. He'll continue to be monitored in the ER, but I feel he is medically cleared at this time.  ____________________________________________   FINAL CLINICAL IMPRESSION(S) / ED DIAGNOSES  Final diagnoses:  Polysubstance abuse  Accidental fentanyl overdose, initial encounter      NEW MEDICATIONS STARTED DURING THIS VISIT:  New Prescriptions   No medications on file     Note:  This document was prepared using Dragon voice recognition software and may include unintentional dictation errors.     Sharyn Creamer, MD 11/12/16 2047

## 2016-11-12 NOTE — Discharge Instructions (Signed)
Stop using drugs. Please call the number provided if you are interested in detox. Return to the ER for recurrent or worsening symptoms, persistent vomiting, difficulty breathing or other concerns.

## 2016-11-12 NOTE — ED Notes (Signed)
ED BHU PLACEMENT JUSTIFICATION Is the patient under IVC or is there intent for IVC: Yes.   Is the patient medically cleared: Yes.   Is there vacancy in the ED BHU: Yes.   Is the population mix appropriate for patient: Yes.   Is the patient awaiting placement in inpatient or outpatient setting: Yes.   Has the patient had a psychiatric consult: Yes.   Survey of unit performed for contraband, proper placement and condition of furniture, tampering with fixtures in bathroom, shower, and each patient room: Yes.  ; Findings:  APPEARANCE/BEHAVIOR Calm and cooperative NEURO ASSESSMENT Orientation: oriented x3  Denies pain Hallucinations: No.None noted (Hallucinations) Speech: Normal Gait: normal RESPIRATORY ASSESSMENT Even  Unlabored respirations  CARDIOVASCULAR ASSESSMENT Pulses equal   regular rate  Skin warm and dry   GASTROINTESTINAL ASSESSMENT no GI complaint EXTREMITIES Full ROM  PLAN OF CARE Provide calm/safe environment. Vital signs assessed twice daily. ED BHU Assessment once each 12-hour shift. Collaborate with TTS  daily or as condition indicates. Assure the ED provider has rounded once each shift. Provide and encourage hygiene. Provide redirection as needed. Assess for escalating behavior; address immediately and inform ED provider.  Assess family dynamic and appropriateness for visitation as needed: Yes.  ; If necessary, describe findings:  Educate the patient/family about BHU procedures/visitation: Yes.  ; If necessary, describe findings:   

## 2016-11-12 NOTE — ED Notes (Signed)
Patient in bathroom at this time.

## 2016-11-12 NOTE — ED Notes (Addendum)
Dr.Clapaac talking with patient at this time

## 2016-11-12 NOTE — ED Notes (Signed)
Patient given meal tray. Sitting in bed eating at this time. NAD noted.

## 2016-11-12 NOTE — ED Notes (Signed)

## 2016-11-12 NOTE — ED Triage Notes (Signed)
Pt to ED via EMS from home, pt found by family unresponsive. Known heroin overdose. Per EMS pt was being assisted with BVM by fire, EMS gave 8mg  narcan, pt A&Ox4 at this time, VS stable. Pt also overdosed last night and was given 8mg  by EMS , pt refused transport.

## 2016-11-12 NOTE — ED Provider Notes (Signed)
I spoke with the psychiatrist Dr. Toni Amendlapacs who rescinded the involuntary commitment paperwork. He offered the patient a couple of different options, patient wants to be discharged home.  Social worker Claudine did provide the patient with information for multiple substance abuse resources.  I spoke specifically with the patient about risk of overdose and death.  Is discharged instructions and paperwork.   Governor Rooksebecca Vayden Weinand, MD 11/12/16 [phone]

## 2016-11-12 NOTE — Consult Note (Signed)
[facility] Consult   Reason for Consult:  Consult for 36 year old man with a history of substance abuse who presents for the second day in a row with an opiate overdose Referring Physician:  Reita Cliche Patient Identification: Randy Woods. MRN:  [id number] Principal Diagnosis: Opiate overdose Diagnosis:   Patient Active Problem List   Diagnosis Date Noted  . Opiate overdose [T40.601A] 11/12/2016  . Bipolar 1 disorder, mixed, moderate ([place]) [F31.62] 05/07/2016  . Staphylococcal sepsis ([place]) [A41.2] 05/06/2016  . Severe recurrent major depression without psychotic features ([place]) [F33.2] 05/22/2015  . Opiate abuse, continuous [F11.10] 05/22/2015  . Cocaine abuse [F14.10] 05/22/2015    Total Time spent with patient: 1 hour  Subjective:   Randy Woods. is a 36 y.o. male patient admitted with "heroin overdose".  HPI:  Patient interviewed. Chart reviewed. Labs reviewed. 36 year old man brought in by EMS who found him unconscious. Patient responded to Narcan. This is the second day in a row that he has presented to the hospital with an opiate overdose. He says he remembers shooting up this morning. Totally denies he had any thoughts or wish of trying to hurt or kill himself. Patient has been using heroin intravenously regularly for about 7 months steady although his history of opiate abuse goes back much farther than that. He says he starting to suspect that may be the current batch of drugs he has has that fentanyl and it. Patient says that his mood is been up and down nothing very up out of the ordinary recently. Sleep is erratic as his life style is erratic. Patient denies any suicidal ideation denies any homicidal ideation. Denies any psychosis. He denied to me that he was using any other drugs other than occasional marijuana although his drug screen is positive for multiple substances. He is not currently getting any regular outpatient treatment.  Social history: Patient is  living with his mother and 2 sisters. Actually he mostly stays in a house by himself and they live nearby. Not working.  Medical history: Overweight. Just recovering today from his second heroin overdose. No other active ongoing medical problems.  Substance abuse history: Long history of abuse of multiple drugs with narcotics being prominent. He has been in Suboxone treatment in the past but has lost that privilege and for the last 7 months has been back to using heroin daily.  Past Psychiatric History: Patient has been diagnosed with bipolar depression in the past although most of his treatment and diagnosis has also resumed involved around his substance abuse. No clear history of psychosis. Denies any history of suicide attempts in the past.  Risk to Self: Is patient at risk for suicide?: No Risk to Others:   Prior Inpatient Therapy:   Prior Outpatient Therapy:    Past Medical History:  Past Medical History:  Diagnosis Date  . Anxiety   . Bronchitis   . Cocaine abuse   . Depression   . Drug abuse   . Heroin abuse     Past Surgical History:  Procedure Laterality Date  . NOSE SURGERY    . TEE WITHOUT CARDIOVERSION N/A 05/10/2016   Procedure: TRANSESOPHAGEAL ECHOCARDIOGRAM (TEE);  Surgeon: Dionisio David, MD;  Location: [facility] ORS;  Service: Cardiovascular;  Laterality: N/A;  . TYMPANOPLASTY     Family History:  Family History  Problem Relation Age of Onset  . Diabetes Mother   . Hypertension Father   . Kidney failure Father   . Diabetes Father  Family Psychiatric  History: None known Social History:  History  Alcohol Use  . Yes     History  Drug Use    Comment: HEROIN    Social History   Social History  . Marital status: Widowed    Spouse name: N/A  . Number of children: N/A  . Years of education: N/A   Social History Main Topics  . Smoking status: Current Every Day Smoker    Packs/day: 1.50    Types: Cigarettes  . Smokeless tobacco: Current User    Types:  Snuff  . Alcohol use Yes  . Drug use: Yes     Comment: HEROIN  . Sexual activity: Not Asked   Other Topics Concern  . None   Social History Narrative  . None   Additional Social History:    Allergies:   Allergies  Allergen Reactions  . Bactrim [Sulfamethoxazole-Trimethoprim]   . Sulfa Antibiotics Other (See Comments)    Pt states "the skin peels off of my penis"    Labs:  Results for orders placed or performed during the hospital encounter of 11/12/16 (from the past 48 hour(s))  Comprehensive metabolic panel     Status: Abnormal   Collection Time: 11/12/16 12:27 PM  Result Value Ref Range   Sodium 137 135 - 145 mmol/L   Potassium 4.2 3.5 - 5.1 mmol/L   Chloride 101 101 - 111 mmol/L   CO2 24 22 - 32 mmol/L   Glucose, Bld 151 (H) 65 - 99 mg/dL   BUN 18 6 - 20 mg/dL   Creatinine, Ser 1.31 (H) 0.61 - 1.24 mg/dL   Calcium 8.3 (L) 8.9 - 10.3 mg/dL   Total Protein 6.9 6.5 - 8.1 g/dL   Albumin 3.8 3.5 - 5.0 g/dL   AST 299 (H) 15 - 41 U/L   ALT 113 (H) 17 - 63 U/L   Alkaline Phosphatase 54 38 - 126 U/L   Total Bilirubin 0.9 0.3 - 1.2 mg/dL   GFR calc non Af Amer >60 >60 mL/min   GFR calc Af Amer >60 >60 mL/min    Comment: (NOTE) The eGFR has been calculated using the CKD EPI equation. This calculation has not been validated in all clinical situations. eGFR's persistently <60 mL/min signify possible Chronic Kidney Disease.    Anion gap 12 5 - 15  Ethanol     Status: None   Collection Time: 11/12/16 12:27 PM  Result Value Ref Range   Alcohol, Ethyl (B) <5 <5 mg/dL    Comment:        LOWEST DETECTABLE LIMIT FOR SERUM ALCOHOL IS 5 mg/dL FOR MEDICAL PURPOSES ONLY   Salicylate level     Status: None   Collection Time: 11/12/16 12:27 PM  Result Value Ref Range   Salicylate Lvl <0.1 2.8 - 30.0 mg/dL  Acetaminophen level     Status: Abnormal   Collection Time: 11/12/16 12:27 PM  Result Value Ref Range   Acetaminophen (Tylenol), Serum <10 (L) 10 - 30 ug/mL    Comment:         THERAPEUTIC CONCENTRATIONS VARY SIGNIFICANTLY. A RANGE OF 10-30 ug/mL MAY BE AN EFFECTIVE CONCENTRATION FOR MANY PATIENTS. HOWEVER, SOME ARE BEST TREATED AT CONCENTRATIONS OUTSIDE THIS RANGE. ACETAMINOPHEN CONCENTRATIONS >150 ug/mL AT 4 HOURS AFTER INGESTION AND >50 ug/mL AT 12 HOURS AFTER INGESTION ARE OFTEN ASSOCIATED WITH TOXIC REACTIONS.   cbc     Status: None   Collection Time: 11/12/16 12:27 PM  Result Value Ref Range  WBC 8.4 3.8 - 10.6 K/uL   RBC 4.68 4.40 - 5.90 MIL/uL   Hemoglobin 13.7 13.0 - 18.0 g/dL   HCT 40.8 40.0 - 52.0 %   MCV 87.2 80.0 - 100.0 fL   MCH 29.2 26.0 - 34.0 pg   MCHC 33.5 32.0 - 36.0 g/dL   RDW 13.5 11.5 - 14.5 %   Platelets 150 150 - 440 K/uL  Urine Drug Screen, Qualitative     Status: Abnormal   Collection Time: 11/12/16 12:27 PM  Result Value Ref Range   Tricyclic, Ur Screen NONE DETECTED NONE DETECTED   Amphetamines, Ur Screen NONE DETECTED NONE DETECTED   MDMA (Ecstasy)Ur Screen NONE DETECTED NONE DETECTED   Cocaine Metabolite,Ur [place] POSITIVE (A) NONE DETECTED   Opiate, Ur Screen POSITIVE (A) NONE DETECTED   Phencyclidine (PCP) Ur S NONE DETECTED NONE DETECTED   Cannabinoid 50 Ng, Ur [place] POSITIVE (A) NONE DETECTED   Barbiturates, Ur Screen NONE DETECTED NONE DETECTED   Benzodiazepine, Ur Scrn POSITIVE (A) NONE DETECTED   Methadone Scn, Ur POSITIVE (A) NONE DETECTED    Comment: (NOTE) 263  Tricyclics, urine               Cutoff 1000 ng/mL 200  Amphetamines, urine             Cutoff 1000 ng/mL 300  MDMA (Ecstasy), urine           Cutoff 500 ng/mL 400  Cocaine Metabolite, urine       Cutoff 300 ng/mL 500  Opiate, urine                   Cutoff 300 ng/mL 600  Phencyclidine (PCP), urine      Cutoff 25 ng/mL 700  Cannabinoid, urine              Cutoff 50 ng/mL 800  Barbiturates, urine             Cutoff 200 ng/mL 900  Benzodiazepine, urine           Cutoff 200 ng/mL 1000 Methadone, urine                Cutoff 300 ng/mL 1100 1200  The urine drug screen provides only a preliminary, unconfirmed 1300 analytical test result and should not be used for non-medical 1400 purposes. Clinical consideration and professional judgment should 1500 be applied to any positive drug screen result due to possible 1600 interfering substances. A more specific alternate chemical method 1700 must be used in order to obtain a confirmed analytical result.  1800 Gas chromato graphy / mass spectrometry (GC/MS) is the preferred 1900 confirmatory method.     Current Facility-Administered Medications  Medication Dose Route Frequency Provider Last Rate Last Dose  . naloxone (NARCAN) nasal spray 4 mg/0.1 mL  1 spray Nasal Once Delman Kitten, MD      . sodium chloride 0.9 % bolus 500 mL  500 mL Intravenous Once Delman Kitten, MD       Current Outpatient Prescriptions  Medication Sig Dispense Refill  . buprenorphine-naloxone (SUBOXONE) 8-2 MG SUBL SL tablet Place 1 tablet under the tongue 2 (two) times daily.    . cloNIDine (CATAPRES) 0.1 MG tablet Take 1 tablet (0.1 mg total) by mouth 2 (two) times daily. (Patient not taking: Reported on 11/12/2016) 14 tablet 0  . ibuprofen (ADVIL,MOTRIN) 800 MG tablet Take 1 tablet (800 mg total) by mouth every 8 (eight) hours as needed. (Patient not taking: Reported  on 11/12/2016) 30 tablet 0  . QUEtiapine (SEROQUEL) 100 MG tablet Take 1 tablet (100 mg total) by mouth at bedtime. (Patient not taking: Reported on 05/06/2016) 30 tablet 0  . ziprasidone (GEODON) 40 MG capsule Take 1 capsule (40 mg total) by mouth 2 (two) times daily with a meal. (Patient not taking: Reported on 05/06/2016) 60 capsule 0    Musculoskeletal: Strength & Muscle Tone: within normal limits Gait & Station: normal Patient leans: N/A  Psychiatric Specialty Exam: Physical Exam  Nursing note and vitals reviewed. Constitutional: He appears well-developed and well-nourished.  HENT:  Head: Normocephalic and atraumatic.  Eyes: Conjunctivae are normal.  Pupils are equal, round, and reactive to light.  Neck: Normal range of motion.  Cardiovascular: Regular rhythm and normal heart sounds.   Respiratory: Effort normal. No respiratory distress.  GI: Soft.  Musculoskeletal: Normal range of motion.  Neurological: He is alert.  Skin: Skin is warm and dry.  Psychiatric: His speech is normal and behavior is normal. Thought content normal. His mood appears not anxious. Cognition and memory are normal. He expresses impulsivity. He does not exhibit a depressed mood.    Review of Systems  Constitutional: Negative.   HENT: Negative.   Eyes: Negative.   Respiratory: Negative.   Cardiovascular: Negative.   Gastrointestinal: Negative.   Musculoskeletal: Negative.   Skin: Negative.   Neurological: Negative.   Psychiatric/Behavioral: Positive for substance abuse. Negative for depression, hallucinations, memory loss and suicidal ideas. The patient has insomnia. The patient is not nervous/anxious.     Blood pressure (!) 143/75, pulse 88, temperature 98.1 F (36.7 C), temperature source Oral, resp. rate 16, height _0  (1.753 m), weight 136.1 kg (300 lb), SpO2 100 %.Body mass index is 44.3 kg/m.  General Appearance: Fairly Groomed  Eye Contact:  Fair  Speech:  Normal Rate  Volume:  Normal  Mood:  Euthymic  Affect:  Congruent  Thought Process:  Goal Directed  Orientation:  Full (Time, Place, and Person)  Thought Content:  Logical  Suicidal Thoughts:  No  Homicidal Thoughts:  No  Memory:  Immediate;   Good Recent;   Fair Remote;   Fair  Judgement:  Fair  Insight:  Fair  Psychomotor Activity:  Normal  Concentration:  Concentration: Fair  Recall:  [employer] of Knowledge:  Fair  Language:  Fair  Akathisia:  No  Handed:  Right  AIMS (if indicated):     Assets:  Desire for Improvement Housing Physical Health Resilience  ADL's:  Intact  Cognition:  WNL  Sleep:        Treatment Plan Summary: Plan 36 year old man with opiate dependence  as well as dependence and abuse of multiple other drugs presents for the second day in a row having overdosed on intravenous narcotics. Patient currently presents as awake alert oriented. Fully understands his situation. Affect is reactive and euthymic. Denies any suicidal thoughts. No evidence of psychosis. Patient understands the risks of his continued behavior. I offered him the opportunity to stay in the emergency room if we were to look for any kind of short-term detox for him. Patient declines and says he would rather do that on his own. Does not meet commitment criteria. Discontinue IVC. Supportive counseling completed. Case reviewed with emergency room doctor and TTS.  Disposition: Patient does not meet criteria for psychiatric inpatient admission.  Alethia Berthold, MD 11/12/2016 3:16 PM

## 2016-11-12 NOTE — ED Notes (Signed)
Lab at bedside

## 2017-02-07 ENCOUNTER — Emergency Department
Admission: EM | Admit: 2017-02-07 | Discharge: 2017-02-07 | Disposition: A | Discharge status: Home / Self Care | Payer: Medicaid Other | Attending: Emergency Medicine | Admitting: Emergency Medicine

## 2017-02-07 DIAGNOSIS — F17290 Nicotine dependence, other tobacco product, uncomplicated: Secondary | ICD-10-CM | POA: Diagnosis not present

## 2017-02-07 DIAGNOSIS — F32A Depression, unspecified: Secondary | ICD-10-CM

## 2017-02-07 DIAGNOSIS — F1994 Other psychoactive substance use, unspecified with psychoactive substance-induced mood disorder: Secondary | ICD-10-CM

## 2017-02-07 DIAGNOSIS — F329 Major depressive disorder, single episode, unspecified: Secondary | ICD-10-CM | POA: Insufficient documentation

## 2017-02-07 DIAGNOSIS — R45851 Suicidal ideations: Secondary | ICD-10-CM

## 2017-02-07 DIAGNOSIS — F1110 Opioid abuse, uncomplicated: Secondary | ICD-10-CM

## 2017-02-07 DIAGNOSIS — F3162 Bipolar disorder, current episode mixed, moderate: Secondary | ICD-10-CM | POA: Diagnosis present

## 2017-02-07 DIAGNOSIS — F1910 Other psychoactive substance abuse, uncomplicated: Secondary | ICD-10-CM

## 2017-02-07 DIAGNOSIS — F17210 Nicotine dependence, cigarettes, uncomplicated: Secondary | ICD-10-CM | POA: Diagnosis not present

## 2017-02-07 DIAGNOSIS — F1410 Cocaine abuse, uncomplicated: Secondary | ICD-10-CM | POA: Diagnosis present

## 2017-02-07 LAB — COMPREHENSIVE METABOLIC PANEL
ALT: 84 U/L — ABNORMAL HIGH (ref 17–63)
AST: 53 U/L — ABNORMAL HIGH (ref 15–41)
Albumin: 4.1 g/dL (ref 3.5–5.0)
Alkaline Phosphatase: 57 U/L (ref 38–126)
Anion gap: 8 (ref 5–15)
BUN: 14 mg/dL (ref 6–20)
CO2: 26 mmol/L (ref 22–32)
Calcium: 8.8 mg/dL — ABNORMAL LOW (ref 8.9–10.3)
Chloride: 104 mmol/L (ref 101–111)
Creatinine, Ser: 0.99 mg/dL (ref 0.61–1.24)
GFR calc Af Amer: >60 mL/min (ref >60)
GFR calc non Af Amer: >60 mL/min (ref >60)
Glucose, Bld: 128 mg/dL — ABNORMAL HIGH (ref 65–99)
Potassium: 3.5 mmol/L (ref 3.5–5.1)
Sodium: 138 mmol/L (ref 135–145)
Total Bilirubin: 0.7 mg/dL (ref 0.3–1.2)
Total Protein: 7.5 g/dL (ref 6.5–8.1)

## 2017-02-07 LAB — URINE DRUG SCREEN, QUALITATIVE (ARMC ONLY)
Amphetamines, Ur Screen: NOT DETECTED
Barbiturates, Ur Screen: NOT DETECTED
Benzodiazepine, Ur Scrn: POSITIVE — AB
Cannabinoid 50 Ng, Ur ~~LOC~~: NOT DETECTED
Cocaine Metabolite,Ur ~~LOC~~: POSITIVE — AB
MDMA (Ecstasy)Ur Screen: NOT DETECTED
Methadone Scn, Ur: POSITIVE — AB
Opiate, Ur Screen: POSITIVE — AB
Phencyclidine (PCP) Ur S: NOT DETECTED
Tricyclic, Ur Screen: NOT DETECTED

## 2017-02-07 LAB — CBC
HCT: 43.5 % (ref 40.0–52.0)
Hemoglobin: 15.1 g/dL (ref 13.0–18.0)
MCH: 29.9 pg (ref 26.0–34.0)
MCHC: 34.7 g/dL (ref 32.0–36.0)
MCV: 86.1 fL (ref 80.0–100.0)
Platelets: 151 10*3/uL (ref 150–440)
RBC: 5.06 MIL/uL (ref 4.40–5.90)
RDW: 13.2 % (ref 11.5–14.5)
WBC: 7.7 10*3/uL (ref 3.8–10.6)

## 2017-02-07 LAB — ETHANOL: Alcohol, Ethyl (B): <5 mg/dL (ref <5)

## 2017-02-07 MED ORDER — CARBAMAZEPINE 100 MG PO CHEW
100.0000 mg | CHEWABLE_TABLET | Freq: Every day | ORAL | Status: DC
  Filled 2017-02-07: qty 1

## 2017-02-07 MED ORDER — LORAZEPAM 1 MG PO TABS: 1.0000 mg | ORAL_TABLET | Freq: Four times a day (QID) | ORAL | Status: DC | PRN

## 2017-02-07 MED ORDER — TRAZODONE HCL 100 MG PO TABS: 100.0000 mg | ORAL_TABLET | Freq: Every day | ORAL | Status: DC

## 2017-02-07 MED ORDER — ZIPRASIDONE MESYLATE 20 MG IM SOLR: 10.0000 mg | Freq: Four times a day (QID) | INTRAMUSCULAR | Status: DC | PRN

## 2017-02-07 MED ORDER — BUPRENORPHINE HCL-NALOXONE HCL 8-2 MG SL SUBL
3.0000 | SUBLINGUAL_TABLET | Freq: Once | SUBLINGUAL | Status: DC
Start: 2017-02-07 — End: 2017-02-07
  Filled 2017-02-07: qty 3

## 2017-02-07 NOTE — Consult Note (Signed)
[facility] Consult   Reason for Consult:  Consult for 36 year old man brought voluntarily to the emergency room because of relapse into substance abuse and mood symptoms Referring Physician:  Paduchowski Patient Identification: Randy Woods. MRN:  [id number] Principal Diagnosis: Substance induced mood disorder ([place]) Diagnosis:   Patient Active Problem List   Diagnosis Date Noted  . Opiate abuse, episodic [F11.10] 02/07/2017  . Substance induced mood disorder ([place]) [F19.94] 02/07/2017  . Opiate overdose [T40.601A] 11/12/2016  . Bipolar 1 disorder, mixed, moderate ([place]) [F31.62] 05/07/2016  . Staphylococcal sepsis ([place]) [A41.2] 05/06/2016  . Severe recurrent major depression without psychotic features ([place]) [F33.2] 05/22/2015  . Opiate abuse, continuous [F11.10] 05/22/2015  . Cocaine abuse [F14.10] 05/22/2015    Total Time spent with patient: 1 hour  Subjective:   Randy Woods. is a 36 y.o. male patient admitted with "I went back to cocaine and heroin 1 and I've been feeling depressed".  HPI:  Patient interviewed chart reviewed. This 36 year old man with a history of substance abuse and mood instability said that he had been doing very well participating in a substance abuse program in [facility] ever since he was here in March. He had been staying sober and staying compliant with Suboxone treatment. Last Thursday was his birthday and he relapsed into cocaine use and heroin use. The heroin was only 1 day but he has been using cocaine ever since then. Mood was feeling sad and disgusted with himself. Not sleeping very well. Passive suicidal thoughts. No psychosis. Patient rested here overnight and reports that he is now feeling very tired out. Able to talk about how disappointing that he is in his own behavior. No suicidal intent or plan. No evidence of psychosis.  Medical history: Overweight. Fairly stable medically otherwise.  Substance abuse history:  Long-standing problem with narcotics and cocaine. It sounds like this current program is involved in may be one of the best things that he's done about it in a while.  Social history: Not working. Lives at his mother's home. Mother and sister are closest supports for him.  Past Psychiatric History: Patient has been seen by psychiatric service in the past for his substance abuse symptoms and for mood instability in the context of it. He has a past diagnosis of bipolar disorder although his mood instability has always seemed to be uniquely in context of his relapses and heavy drug abuse. No past history of suicide attempts or violence.  Risk to Self: Suicidal Ideation: Yes-Currently Present Suicidal Intent: No Is patient at risk for suicide?: No Suicidal Plan?: No-Not Currently/Within Last 6 Months Access to Means: No What has been your use of drugs/alcohol within the last 12 months?: use of cocaine and heroin How many times?: 1 Other Self Harm Risks: no Triggers for Past Attempts: Unknown Intentional Self Injurious Behavior: None Risk to Others: Homicidal Ideation: No Thoughts of Harm to Others: No Current Homicidal Intent: No Current Homicidal Plan: No Access to Homicidal Means: No Identified Victim: None identified History of harm to others?: No Assessment of Violence: None Noted Does patient have access to weapons?: No Criminal Charges Pending?: Yes Describe Pending Criminal Charges: drugs on him when EMS called Does patient have a court date: Yes Court Date: 02/08/17 (02/22/2017 - [place] larcen[place]) Prior Inpatient Therapy: Prior Inpatient Therapy: Yes Prior Therapy Dates: 2015 Prior Therapy Facilty/Provider(s): [facility], [place] Reason for Treatment: Bipolar DIsorder Prior Outpatient Therapy: Prior Outpatient Therapy: Yes Prior Therapy Dates: Current Prior [facility] Reason for Treatment: Bipolar Disorder, substance abuse Does patient have an  ACCT team?: No Does patient have Intensive In-House Services?  : No Does patient have [facility] services? : No Does patient have P4CC services?: No  Past Medical History:  Past Medical History:  Diagnosis Date  . Anxiety   . Bronchitis   . Cocaine abuse   . Depression   . Drug abuse   . Heroin abuse     Past Surgical History:  Procedure Laterality Date  . NOSE SURGERY    . TEE WITHOUT CARDIOVERSION N/A 05/10/2016   Procedure: TRANSESOPHAGEAL ECHOCARDIOGRAM (TEE);  Surgeon: Dionisio David, MD;  Location: [facility] ORS;  Service: Cardiovascular;  Laterality: N/A;  . TYMPANOPLASTY     Family History:  Family History  Problem Relation Age of Onset  . Diabetes Mother   . Hypertension Father   . Kidney failure Father   . Diabetes Father    Family Psychiatric  History: Denies any family history Social History:  History  Alcohol Use  . Yes     History  Drug Use    Comment: HEROIN    Social History   Social History  . Marital status: Widowed    Spouse name: N/A  . Number of children: N/A  . Years of education: N/A   Social History Main Topics  . Smoking status: Current Every Day Smoker    Packs/day: 1.50    Types: Cigarettes  . Smokeless tobacco: Current User    Types: Snuff  . Alcohol use Yes  . Drug use: Yes     Comment: HEROIN  . Sexual activity: Not on file   Other Topics Concern  . Not on file   Social History Narrative  . No narrative on file   Additional Social History:    Allergies:   Allergies  Allergen Reactions  . Bactrim [Sulfamethoxazole-Trimethoprim]   . Sulfa Antibiotics Other (See Comments)    Pt states "the skin peels off of my penis"    Labs:  Results for orders placed or performed during the hospital encounter of 02/07/17 (from the past 48 hour(s))  Comprehensive metabolic panel     Status: Abnormal   Collection Time: 02/07/17  1:53 AM  Result Value Ref Range   Sodium 138 135 - 145 mmol/L   Potassium 3.5 3.5 - 5.1 mmol/L   Chloride  104 101 - 111 mmol/L   CO2 26 22 - 32 mmol/L   Glucose, Bld 128 (H) 65 - 99 mg/dL   BUN 14 6 - 20 mg/dL   Creatinine, Ser 0.99 0.61 - 1.24 mg/dL   Calcium 8.8 (L) 8.9 - 10.3 mg/dL   Total Protein 7.5 6.5 - 8.1 g/dL   Albumin 4.1 3.5 - 5.0 g/dL   AST 53 (H) 15 - 41 U/L   ALT 84 (H) 17 - 63 U/L   Alkaline Phosphatase 57 38 - 126 U/L   Total Bilirubin 0.7 0.3 - 1.2 mg/dL   GFR calc non Af Amer >60 >60 mL/min   GFR calc Af Amer >60 >60 mL/min    Comment: (NOTE) The eGFR has been calculated using the CKD EPI equation. This calculation has not been validated in all clinical situations. eGFR's persistently <60 mL/min signify possible Chronic Kidney Disease.    Anion gap 8 5 - 15  Ethanol     Status: None   Collection Time: 02/07/17  1:53 AM  Result Value Ref Range   Alcohol, Ethyl (B) <  5 <5 mg/dL    Comment:        LOWEST DETECTABLE LIMIT FOR SERUM ALCOHOL IS 5 mg/dL FOR MEDICAL PURPOSES ONLY   cbc     Status: None   Collection Time: 02/07/17  1:53 AM  Result Value Ref Range   WBC 7.7 3.8 - 10.6 K/uL   RBC 5.06 4.40 - 5.90 MIL/uL   Hemoglobin 15.1 13.0 - 18.0 g/dL   HCT 43.5 40.0 - 52.0 %   MCV 86.1 80.0 - 100.0 fL   MCH 29.9 26.0 - 34.0 pg   MCHC 34.7 32.0 - 36.0 g/dL   RDW 13.2 11.5 - 14.5 %   Platelets 151 150 - 440 K/uL  Urine Drug Screen, Qualitative     Status: Abnormal   Collection Time: 02/07/17  1:53 AM  Result Value Ref Range   Tricyclic, Ur Screen NONE DETECTED NONE DETECTED   Amphetamines, Ur Screen NONE DETECTED NONE DETECTED   MDMA (Ecstasy)Ur Screen NONE DETECTED NONE DETECTED   Cocaine Metabolite,Ur [place] POSITIVE (A) NONE DETECTED   Opiate, Ur Screen POSITIVE (A) NONE DETECTED   Phencyclidine (PCP) Ur S NONE DETECTED NONE DETECTED   Cannabinoid 50 Ng, Ur [place] NONE DETECTED NONE DETECTED   Barbiturates, Ur Screen NONE DETECTED NONE DETECTED   Benzodiazepine, Ur Scrn POSITIVE (A) NONE DETECTED   Methadone Scn, Ur POSITIVE (A) NONE DETECTED    Comment:  (NOTE) 308  Tricyclics, urine               Cutoff 1000 ng/mL 200  Amphetamines, urine             Cutoff 1000 ng/mL 300  MDMA (Ecstasy), urine           Cutoff 500 ng/mL 400  Cocaine Metabolite, urine       Cutoff 300 ng/mL 500  Opiate, urine                   Cutoff 300 ng/mL 600  Phencyclidine (PCP), urine      Cutoff 25 ng/mL 700  Cannabinoid, urine              Cutoff 50 ng/mL 800  Barbiturates, urine             Cutoff 200 ng/mL 900  Benzodiazepine, urine           Cutoff 200 ng/mL 1000 Methadone, urine                Cutoff 300 ng/mL 1100 1200 The urine drug screen provides only a preliminary, unconfirmed 1300 analytical test result and should not be used for non-medical 1400 purposes. Clinical consideration and professional judgment should 1500 be applied to any positive drug screen result due to possible 1600 interfering substances. A more specific alternate chemical method 1700 must be used in order to obtain a confirmed analytical result.  1800 Gas chromato graphy / mass spectrometry (GC/MS) is the preferred 1900 confirmatory method.     Current Facility-Administered Medications  Medication Dose Route Frequency Provider Last Rate Last Dose  . buprenorphine-naloxone (SUBOXONE) 8-2 mg per SL tablet 3 tablet  3 tablet Sublingual Once Elena Davia T, MD      . carbamazepine (TEGRETOL) chewable tablet 100 mg  100 mg Oral QHS Harvest Dark, MD      . LORazepam (ATIVAN) tablet 1 mg  1 mg Oral Q6H PRN Harvest Dark, MD      . traZODone (DESYREL) tablet 100 mg  100 mg  Oral QHS Harvest Dark, MD      . ziprasidone (GEODON) injection 10 mg  10 mg Intramuscular Q6H PRN Harvest Dark, MD       Current Outpatient Prescriptions  Medication Sig Dispense Refill  . amphetamine-dextroamphetamine (ADDERALL) 10 MG tablet Take 10 mg by mouth 2 (two) times daily.  0  . buprenorphine-naloxone (SUBOXONE) 8-2 MG SUBL SL tablet Place 1 tablet under the tongue 2 (two) times  daily.    . clonazePAM (KLONOPIN) 1 MG tablet Take 1 mg by mouth 2 (two) times daily as needed. for anxiety  0  . hydrOXYzine (ATARAX/VISTARIL) 50 MG tablet Take 1 tablet by mouth at bedtime.  1  . traZODone (DESYREL) 50 MG tablet Take 50 mg by mouth at bedtime as needed. for sleep  0    Musculoskeletal: Strength & Muscle Tone: within normal limits Gait & Station: normal Patient leans: N/A  Psychiatric Specialty Exam: Physical Exam  Nursing note and vitals reviewed. Constitutional: He appears well-developed and well-nourished.  HENT:  Head: Normocephalic and atraumatic.  Eyes: Conjunctivae are normal. Pupils are equal, round, and reactive to light.  Neck: Normal range of motion.  Cardiovascular: Regular rhythm and normal heart sounds.   Respiratory: Effort normal. No respiratory distress.  GI: Soft.  Musculoskeletal: Normal range of motion.  Neurological: He is alert.  Skin: Skin is warm and dry.  Psychiatric: His affect is blunt. His speech is delayed. He is slowed. Thought content is not paranoid. He expresses impulsivity. He expresses no homicidal and no suicidal ideation. He exhibits abnormal recent memory.    Review of Systems  Constitutional: Negative.   HENT: Negative.   Eyes: Negative.   Respiratory: Negative.   Cardiovascular: Negative.   Gastrointestinal: Negative.   Musculoskeletal: Negative.   Skin: Negative.   Neurological: Negative.   Psychiatric/Behavioral: Positive for depression, memory loss and substance abuse. Negative for hallucinations and suicidal ideas. The patient is nervous/anxious and has insomnia.     Blood pressure (!) 147/74, pulse 92, temperature 98 F (36.7 C), temperature source Oral, resp. rate (!) 24, height '5\' 8"'  (1.727 m), weight (!) 140.6 kg (310 lb), SpO2 96 %.Body mass index is 47.14 kg/m.  General Appearance: Fairly Groomed  Eye Contact:  Fair  Speech:  Slow  Volume:  Decreased  Mood:  Dysphoric  Affect:  Congruent  Thought  Process:  Goal Directed  Orientation:  Full (Time, Place, and Person)  Thought Content:  Logical  Suicidal Thoughts:  No  Homicidal Thoughts:  No  Memory:  Immediate;   Fair Recent;   Fair Remote;   Fair  Judgement:  Fair  Insight:  Fair  Psychomotor Activity:  Decreased  Concentration:  Concentration: Fair  Recall:  [employer] of Knowledge:  Fair  Language:  Fair  Akathisia:  No  Handed:  Right  AIMS (if indicated):     Assets:  Communication Skills Desire for Improvement Housing Physical Health Resilience  ADL's:  Intact  Cognition:  WNL  Sleep:        Treatment Plan Summary: Medication management and Plan This is a 35 year old man with a substance abuse disorder who relapsed into heavy drug abuse for several days. Subsequently felt depressed down and negative about himself. He has relaxed now in the emergency room and denies any intention or plan to kill himself. No evidence of psychosis. Insight is pretty good about his substance use disorder. I checked the database and confirmed that he is still prescribes Suboxone 24  mg a day and will order that is a 1 time dose today. Otherwise the patient can then be released from the emergency room. No need for inpatient psychiatric treatment. He is to continue following up with his outpatient provider [facility]. Case reviewed with the ER physician and TTS.  Disposition: Patient does not meet criteria for psychiatric inpatient admission. Supportive therapy provided about ongoing stressors.  Alethia Berthold, MD 02/07/2017 1:00 PM

## 2017-02-07 NOTE — ED Triage Notes (Addendum)
Pt reports that he is coming off a manic state, states that he has bipolar and depression, pt states that he last used heroin and cocaine on may 24th after having been clean for a little bit. Pt states that he is currently on suboxone, pt reports using cocaine for the last 3-4 days. Pt states he is depressed about using again. Pt also here to be seen for suicidal thoughts. Pt was dropped off by his sister. Pt states, " before I freak out, ya'll are going to give me my medicine right?" pt asked to clarify what medicine he is referring to, " you know tomorrow's a holiday?" pt asked if he is talking about his suboxone, pt states "yes."

## 2017-02-07 NOTE — ED Notes (Signed)
Tech within arms length of pt, pt resting in the recliner, reclined back, watching tv, no distress noted, cont to monitor

## 2017-02-07 NOTE — ED Notes (Signed)
BEHAVIORAL HEALTH ROUNDING Patient sleeping: Yes.   Patient alert and oriented: n/a Behavior appropriate: Yes.  ; If no, describe:  Nutrition and fluids offered: No Toileting and hygiene offered: No Sitter present: not applicable Law enforcement present: Yes  

## 2017-02-07 NOTE — ED Notes (Signed)
Dr Webster in to see pt.  

## 2017-02-07 NOTE — ED Notes (Signed)
Patient reconsults with psychiatrist and ERMD. Patient is now agreeable that he has adequate outpatient resources and that he is not at risk for suicide. Discharged by voucher to home with instructions. Patient ambulatory with steady gait to waiting room to await cab.

## 2017-02-07 NOTE — ED Notes (Signed)

## 2017-02-07 NOTE — ED Notes (Signed)
BEHAVIORAL HEALTH ROUNDING Patient sleeping: Yes.   Patient alert and oriented: not applicable Behavior appropriate: Yes.  ; If no, describe:  Nutrition and fluids offered: No Toileting and hygiene offered: No Sitter present: not applicable Law enforcement present: Yes  

## 2017-02-07 NOTE — ED Notes (Signed)
BEHAVIORAL HEALTH ROUNDING  Patient sleeping: No.  Patient alert and oriented: yes  Behavior appropriate: Yes. ; If no, describe:  Nutrition and fluids offered: Yes  Toileting and hygiene offered: Yes  Sitter present: not applicable, Q 15 min safety rounds and observation.  Law enforcement present: Yes ODS  

## 2017-02-07 NOTE — ED Notes (Addendum)
Patient has consulted with psychiatrist. Eating lunch. Will discharge patient after finishing his lunch.

## 2017-02-07 NOTE — ED Notes (Signed)
[facility] will review chart and return call.

## 2017-02-07 NOTE — ED Notes (Signed)
Patient states when preparing for discharge that he will kill himself is we discharge him. ERMD advised for orders.

## 2017-02-07 NOTE — ED Notes (Signed)
Report given to SOC MD. Camera placed in room and process explained to pt who verbalizes understanding.  

## 2017-02-07 NOTE — ED Notes (Signed)
BEHAVIORAL HEALTH ROUNDING Patient sleeping: No. Patient alert and oriented: yes Behavior appropriate: Yes.  ; If no, describe:  Nutrition and fluids offered: Yes  Toileting and hygiene offered: Yes  Sitter present: not applicable Law enforcement present: Yes  

## 2017-02-07 NOTE — ED Provider Notes (Signed)
[facility] Emergency Department Provider Note   ____________________________________________   First MD Initiated Contact with Patient 02/07/17 0402     (approximate)  I have reviewed the triage vital signs and the nursing notes.   HISTORY  Chief Complaint Psychiatric Evaluation    HPI Randy Woods. is a 36 y.o. male who comes into the hospital today with suicidal ideation. He reports that he thought started today. He has a lot going on with his family. He also reports that he relapsed on drugs on his birthday which was approximately 4 days ago. The patient reports that he had been clean for 3 weeks and that is not normal for him so he is very disappointed. The patient reports that he did have a suicide attempt in his 55s and he was admitted to [facility] at that time. He reports that he drank about a half of a 40 ounce today multiple hours before he came in. He reports though that he is sleepy because he took a sleeping medication before he came in. The patient reports that he is here for evaluation. He states that he has been hearing voices and he is paranoid that people are talking about him. The patient does have a court date tomorrow for a felony charge.   Past Medical History:  Diagnosis Date  . Anxiety   . Bronchitis   . Cocaine abuse   . Depression   . Drug abuse   . Heroin abuse     Patient Active Problem List   Diagnosis Date Noted  . Opiate overdose 11/12/2016  . Bipolar 1 disorder, mixed, moderate (HCC) 05/07/2016  . Staphylococcal sepsis ([facility]) 05/06/2016  . Severe recurrent major depression without psychotic features ([facility]) 05/22/2015  . Opiate abuse, continuous 05/22/2015  . Cocaine abuse 05/22/2015    Past Surgical History:  Procedure Laterality Date  . NOSE SURGERY    . TEE WITHOUT CARDIOVERSION N/A 05/10/2016   Procedure: TRANSESOPHAGEAL ECHOCARDIOGRAM (TEE);  Surgeon: [name], MD;  Location: [facility] ORS;   Service: Cardiovascular;  Laterality: N/A;  . TYMPANOPLASTY      Prior to Admission medications   Medication Sig Start Date End Date Taking? Authorizing Provider  amphetamine-dextroamphetamine (ADDERALL) 10 MG tablet Take 10 mg by mouth 2 (two) times daily. 01/21/17  Yes [provider]  buprenorphine-naloxone (SUBOXONE) 8-2 MG SUBL SL tablet Place 1 tablet under the tongue 2 (two) times daily.   Yes [provider]  clonazePAM (KLONOPIN) 1 MG tablet Take 1 mg by mouth 2 (two) times daily as needed. for anxiety 01/07/17  Yes [provider]  hydrOXYzine (ATARAX/VISTARIL) 50 MG tablet Take 1 tablet by mouth at bedtime. 01/28/17  Yes [provider]  traZODone (DESYREL) 50 MG tablet Take 50 mg by mouth at bedtime as needed. for sleep 01/07/17  Yes [provider]    Allergies Bactrim [sulfamethoxazole-trimethoprim] and Sulfa antibiotics  Family History  Problem Relation Age of Onset  . Diabetes Mother   . Hypertension Father   . Kidney failure Father   . Diabetes Father     Social History Social History  Substance Use Topics  . Smoking status: Current Every Day Smoker    Packs/day: 1.50    Types: Cigarettes  . Smokeless tobacco: Current User    Types: Snuff  . Alcohol use Yes    Review of Systems  Constitutional: No fever/chills Eyes: No visual changes. ENT: No sore throat. Cardiovascular: Denies chest pain.  Respiratory: Denies shortness of breath. Gastrointestinal: No abdominal pain.  No nausea, no vomiting.  No diarrhea.  No constipation. Genitourinary: Negative for dysuria. Musculoskeletal: Negative for back pain. Skin: Negative for rash. Neurological: Negative for headaches, focal weakness or numbness. Psychiatric:suicidal ideation   ____________________________________________   PHYSICAL EXAM:  VITAL SIGNS: ED Triage Vitals  Enc Vitals Group     BP 02/07/17 0150 (!) 147/74     Pulse Rate 02/07/17 0150 92      Resp 02/07/17 0150 (!) 24     Temp 02/07/17 0150 98 F (36.7 C)     Temp Source 02/07/17 0150 Oral     SpO2 02/07/17 0150 96 %     Weight 02/07/17 0152 (!) 310 lb (140.6 kg)     Height 02/07/17 0152 5\' 8"  (1.727 m)     Head Circumference --      Peak Flow --      Pain Score 02/07/17 0204 0     Pain Loc --      Pain Edu? --      Excl. in GC? --     Constitutional: Alert and oriented. Well appearing and in no acute distress. Eyes: Conjunctivae are normal. PERRL. EOMI. Head: Atraumatic. Nose: No congestion/rhinnorhea. Mouth/Throat: Mucous membranes are moist.  Oropharynx non-erythematous. Cardiovascular: Normal rate, regular rhythm. Grossly normal heart sounds.  Good peripheral circulation. Respiratory: Normal respiratory effort.  No retractions. Lungs CTAB. Gastrointestinal: Soft and nontender. No distention. Positive bowel sounds Musculoskeletal: No lower extremity tenderness nor edema.   Neurologic:  Normal speech and language.  Skin:  Skin is warm, dry and intact.  Psychiatric: Mood and affect are normal.   ____________________________________________   LABS (all labs ordered are listed, but only abnormal results are displayed)  Labs Reviewed  COMPREHENSIVE METABOLIC PANEL - Abnormal; Notable for the following:       Result Value   Glucose, Bld 128 (*)    Calcium 8.8 (*)    AST 53 (*)    ALT 84 (*)    All other components within normal limits  URINE DRUG SCREEN, QUALITATIVE ([facility] ONLY) - Abnormal; Notable for the following:    Cocaine Metabolite,Ur [place] POSITIVE (*)    Opiate, Ur Screen POSITIVE (*)    Benzodiazepine, Ur Scrn POSITIVE (*)    Methadone Scn, Ur POSITIVE (*)    All other components within normal limits  ETHANOL  CBC   ____________________________________________  EKG  none ____________________________________________  RADIOLOGY  none ____________________________________________   PROCEDURES  Procedure(s) performed:  None  Procedures  Critical Care performed: No  ____________________________________________   INITIAL IMPRESSION / ASSESSMENT AND PLAN / ED COURSE  Pertinent labs & imaging results that were available during my care of the patient were reviewed by me and considered in my medical decision making (see chart for details).  This is a 36 year old male who comes to the hospital today with suicidal ideation. The patient also has been using drugs. I will have the patient evaluated by TTS as well as by the specialist on-call.     The patient was seen by the specialist on-call psychiatrist. He felt that the patient was not actively suicidal or homicidal or psychotic at this time. He reports that he has situation depression but is future oriented. He does not meet criteria for inpatient psychiatric hospitalization. The patient can be discharged from the psychiatric perspective and needs to follow-up with outpatient psychiatry. ____________________________________________   FINAL CLINICAL IMPRESSION(S) / ED DIAGNOSES  Final diagnoses:  Suicidal ideation  Substance abuse      NEW MEDICATIONS STARTED DURING THIS VISIT:  New Prescriptions   No medications on file     Note:  This document was prepared using Dragon voice recognition software and may include unintentional dictation errors.    Rebecka ApleyWebster, Asees Manfredi P, MD 02/07/17 [id number]

## 2017-02-07 NOTE — ED Notes (Signed)
BEHAVIORAL HEALTH ROUNDING Patient sleeping: Yes.   Patient alert and oriented: not applicable SLEEPING Behavior appropriate: Yes.  ; If no, describe: SLEEPING Nutrition and fluids offered: No SLEEPING Toileting and hygiene offered: NoSLEEPING Sitter present: not applicable, Q 15 min safety rounds and observation. Law enforcement present: Yes ODS 

## 2017-02-07 NOTE — ED Provider Notes (Signed)
-----------------------------------------   11:18 AM on 02/07/2017 -----------------------------------------  Patient has been seen by repeat psychiatry. Upon attempted discharge the patient states he is going to kill himself. Repeat specialist on-call evaluation says to admit to psychiatric unit. I have ordered the recommended medications for the patient.   Minna AntisPaduchowski, Smaran Gaus, MD 02/07/17 1119

## 2017-02-07 NOTE — ED Provider Notes (Addendum)
-----------------------------------------   12:52 PM on 02/07/2017 -----------------------------------------  Patient has been seen and evaluated by psychiatry. They believe the patient is safe for discharge home. I believe the patient is safe for discharge home from a medical perspective. Patient has a history of substance abuse. He has outpatient follow-up already established.   Minna AntisPaduchowski, Jonas Goh, MD 02/07/17 1252  ----------------------------------------- 2:12 PM on 02/07/2017 -----------------------------------------  Upon attempted discharge the patient told the nurse once again a vague suicidal statement. I discussed this with the patient, he states he is just depressed but does not intend on killing himself. I discussed with patient the we have seen him from a medical perspective and psychiatry is evaluating him from a psychiatric perspective and they believe he is safe for discharge home. I see no medical reason to keep the patient in the emergency department. Patient is agreeable to going home. States he has outpatient follow-up. I discussed with the patient resources as far as the shelters in the area. Patient plans on going back to his mother's house where he currently resides. He is asking for help to get him to this location area and he states the walk is too long. We will attempt to obtain a taxi cab voucher for the patient. I believe the patient is safe for discharge home.    Minna AntisPaduchowski, Kia Stavros, MD 02/07/17 1414

## 2017-02-07 NOTE — ED Notes (Signed)
Patient again states he will kill himself if discharged. States will slit his wrists. Psychiatrist will resee.

## 2017-02-07 NOTE — Discharge Instructions (Addendum)
You have been seen in the emergency department for a  psychiatric concern. You have been evaluated both medically as well as psychiatrically. Please follow-up with your outpatient resources provided. Return to the emergency department for any worsening symptoms, or any thoughts of hurting yourself or anyone else so that we may attempt to help you. 

## 2017-02-07 NOTE — ED Notes (Signed)
Pt in subwait with Randy Woods, NT

## 2017-02-07 NOTE — ED Notes (Signed)
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
## 2017-05-23 IMAGING — DX DG CHEST 1V PORT
1 series · 1 of 1 positions shown · non-contrast
Comparison: May 31, 2015

CLINICAL DATA: Fever with chest pain and shortness of breath

EXAM:
PORTABLE CHEST 1 VIEW

[chest ap]
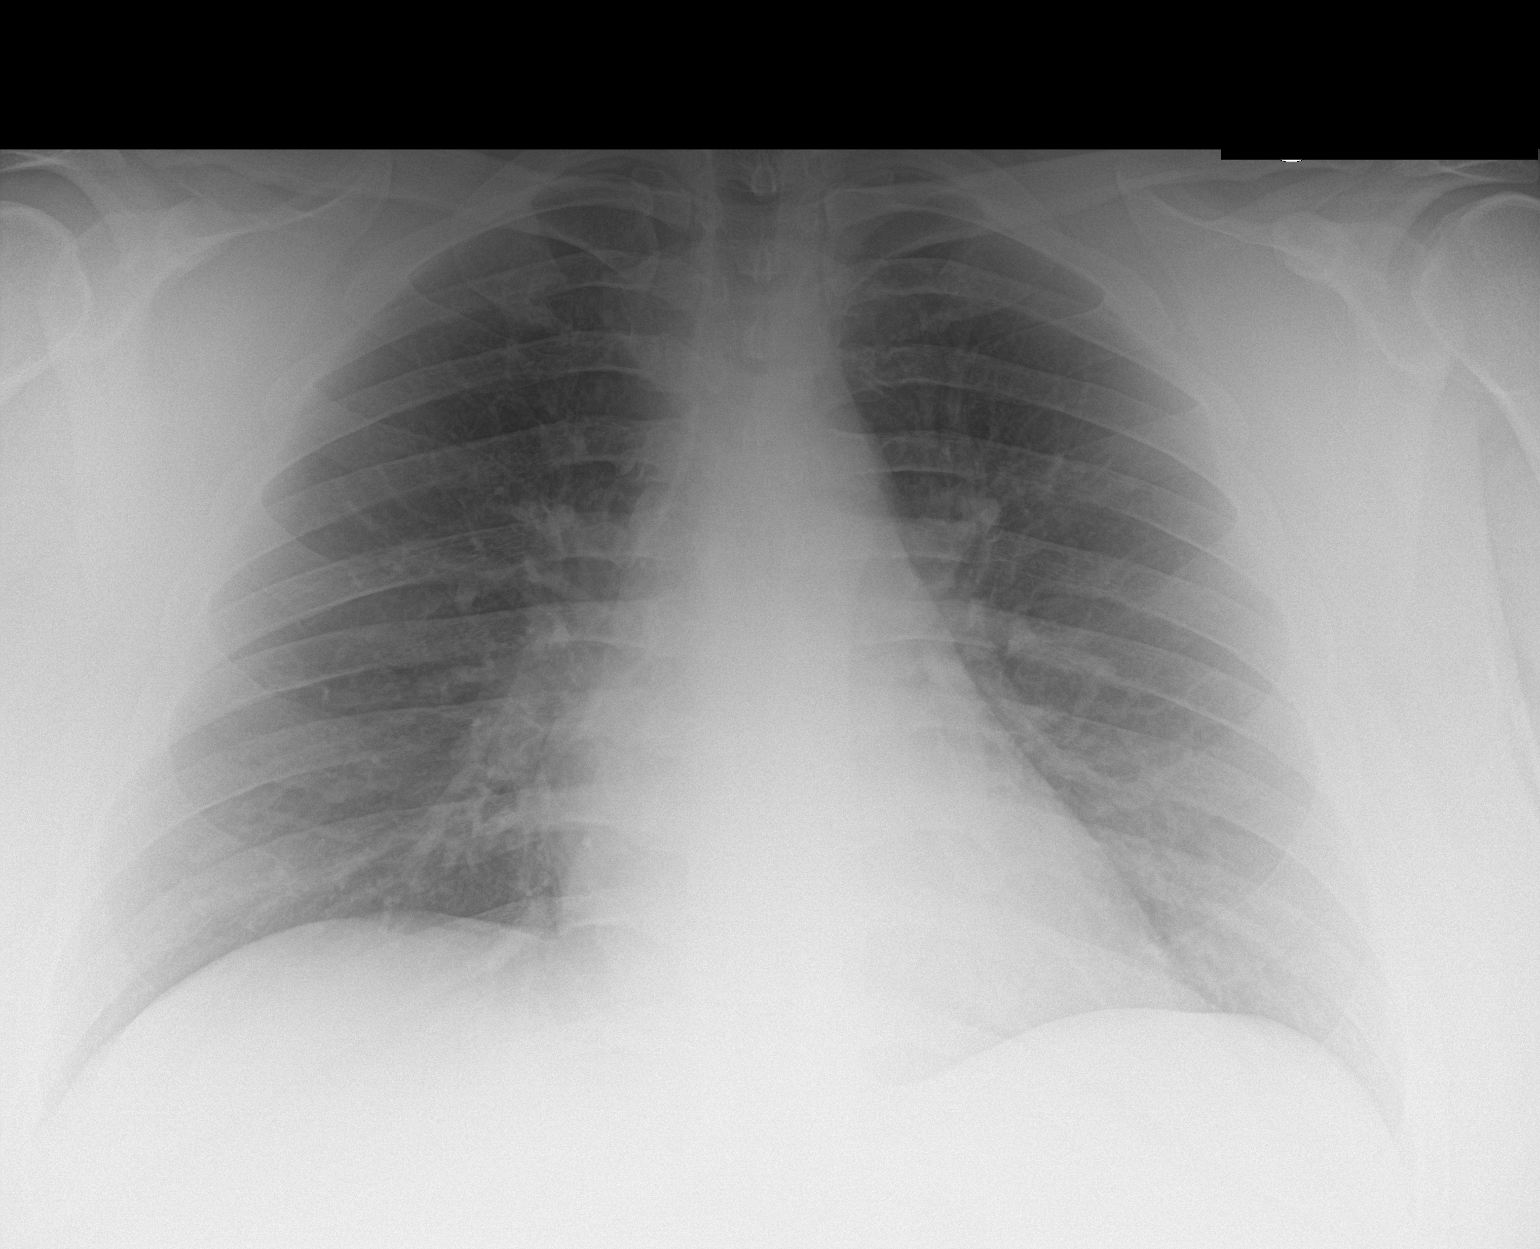

[1 of 1 positions shown; findings below may reference images not displayed]

FINDINGS: Lungs are clear. The heart size and pulmonary vascularity are
normal. No adenopathy. No bone lesions.
IMPRESSION: No edema or consolidation.

## 2017-05-24 IMAGING — CR DG CHEST 2V
1 series · 3 of 3 positions shown · non-contrast
Comparison: 05/05/2016

CLINICAL DATA: Sepsis, fever for 3 days

EXAM:
CHEST  2 VIEW

[Series 1: dg chest 2 view · 0.14mm/px · 3 of 3 slices shown]
[im 1/3]
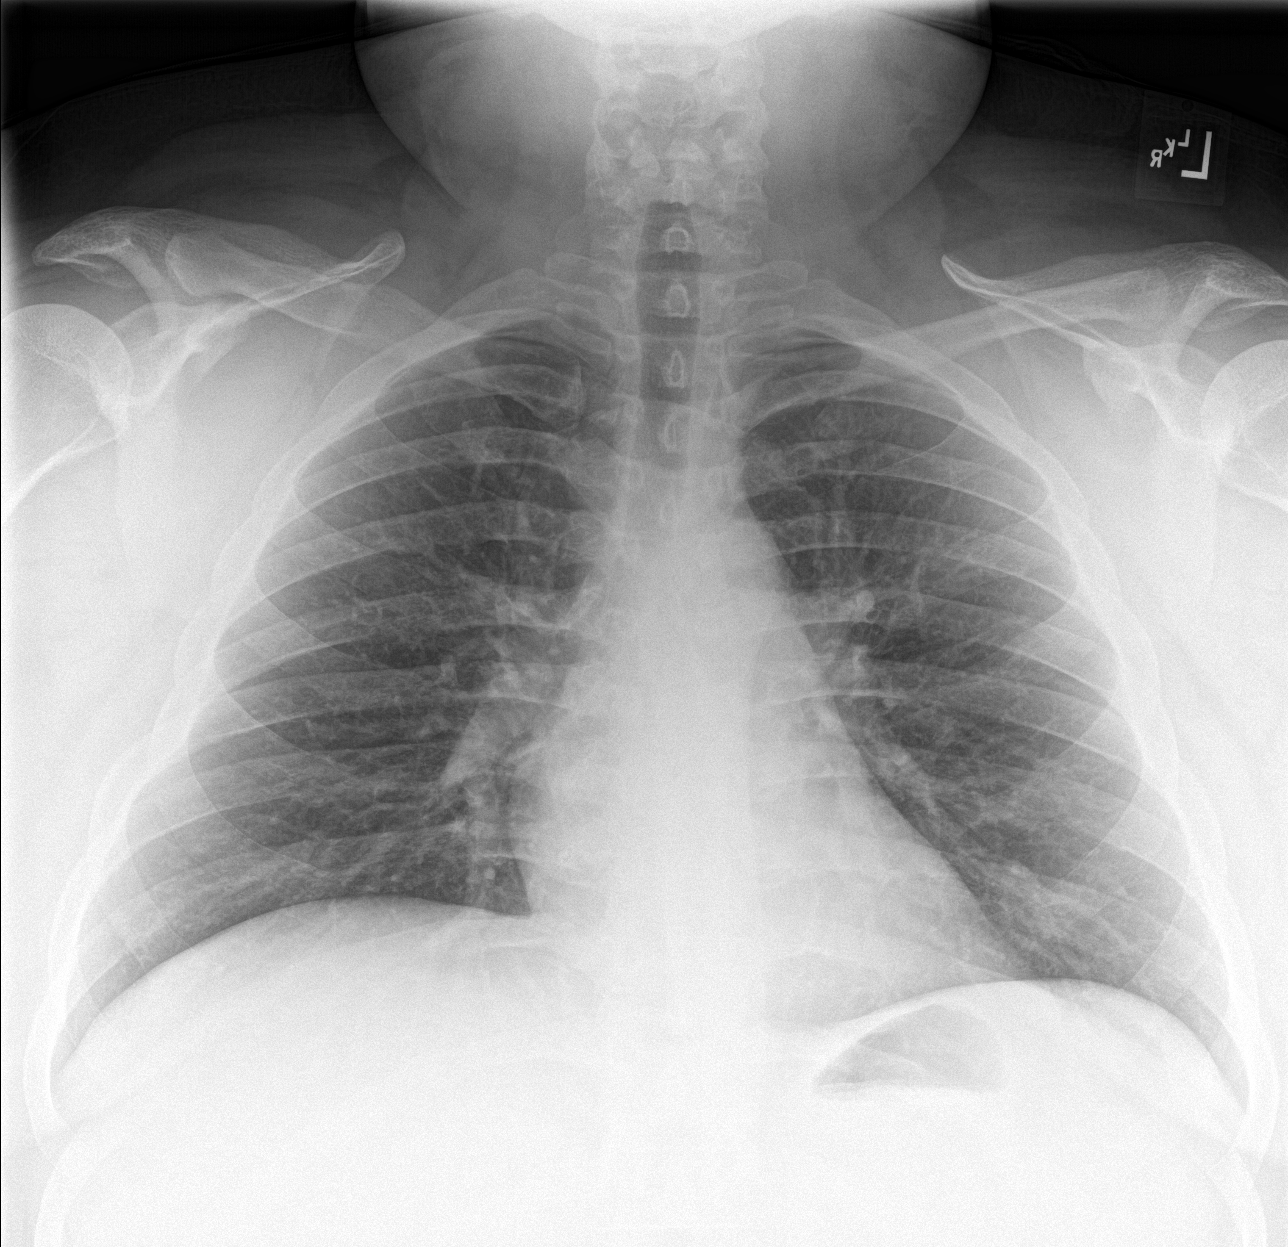
[im 2/3]
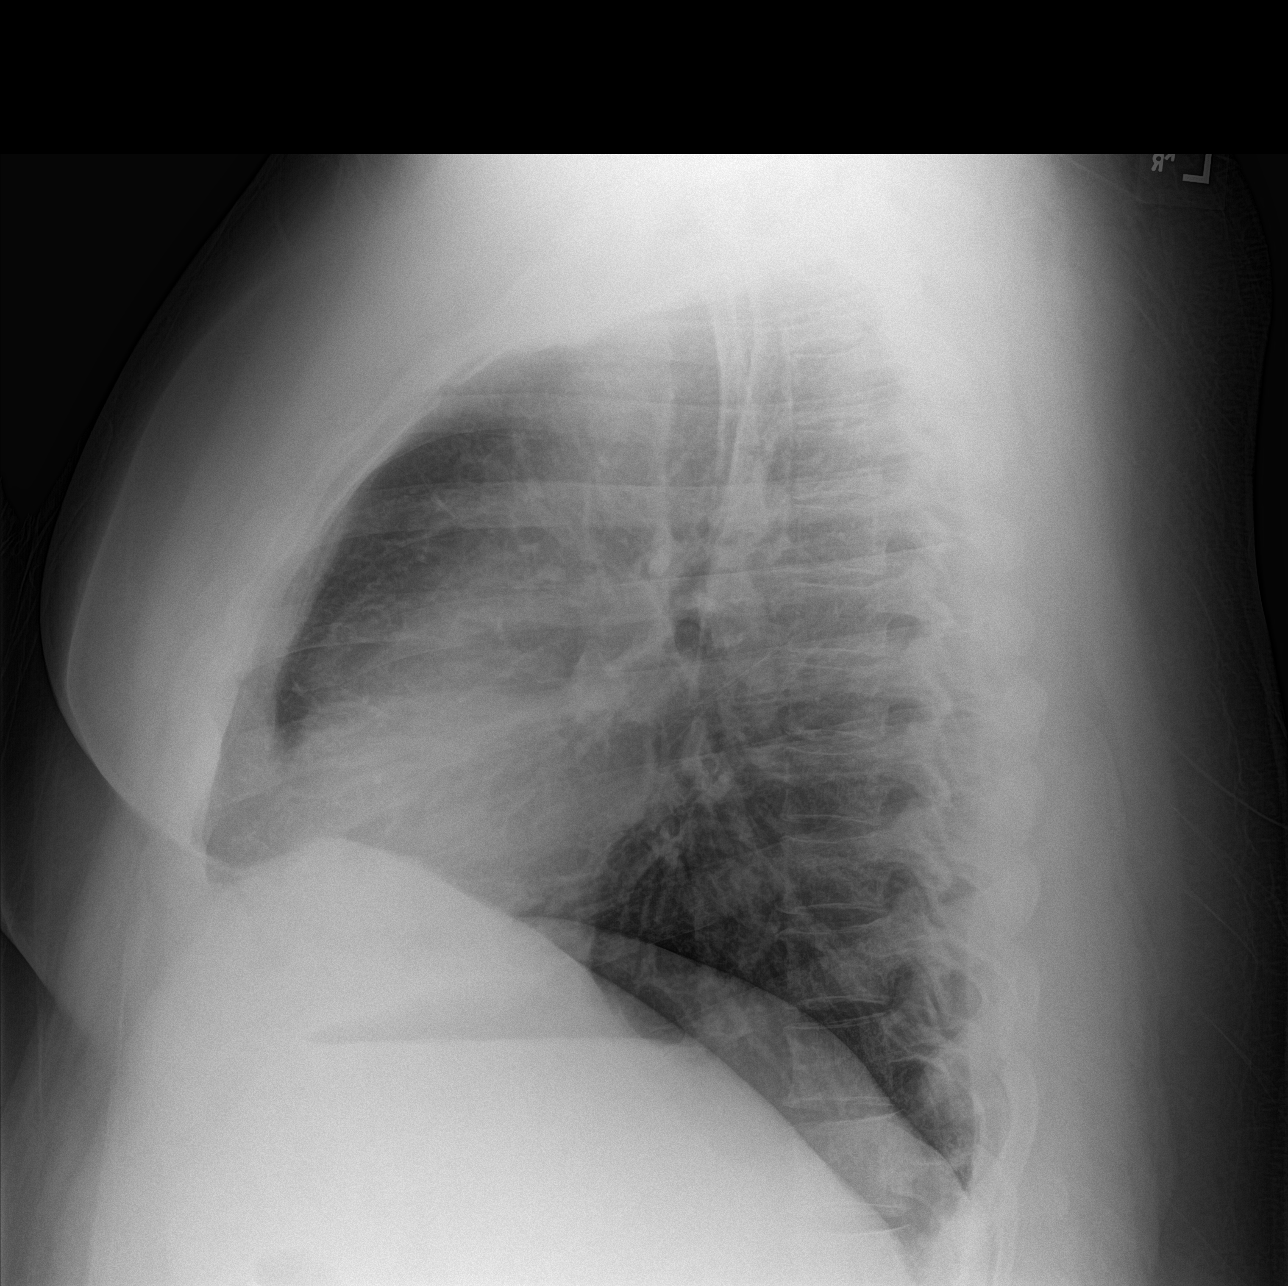
[im 3/3]
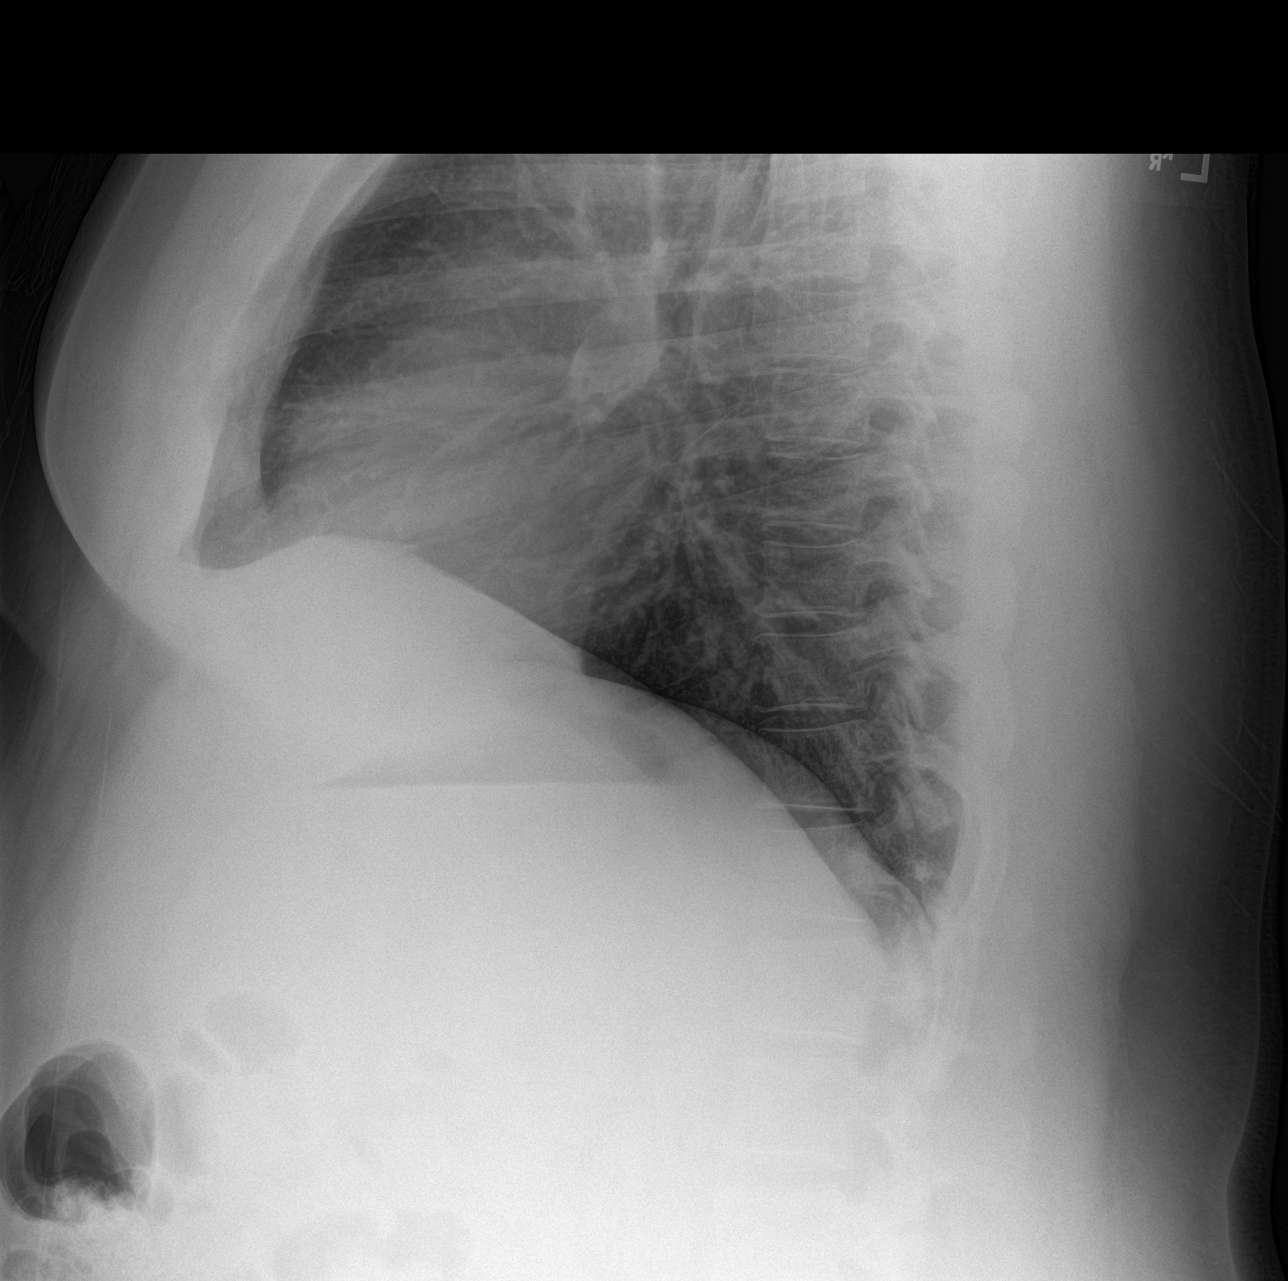

[3 of 3 positions shown; findings below may reference images not displayed]

FINDINGS: The heart size and mediastinal contours are within normal limits.
Both lungs are clear. The visualized skeletal structures are
unremarkable.
IMPRESSION: No active cardiopulmonary disease.

## 2017-05-24 IMAGING — DX DG CHEST 1V PORT
1 series · 1 of 1 positions shown · non-contrast
Comparison: Frontal and lateral views earlier this day.

CLINICAL DATA: PICC placement.

EXAM:
PORTABLE CHEST 1 VIEW

[chest ap]
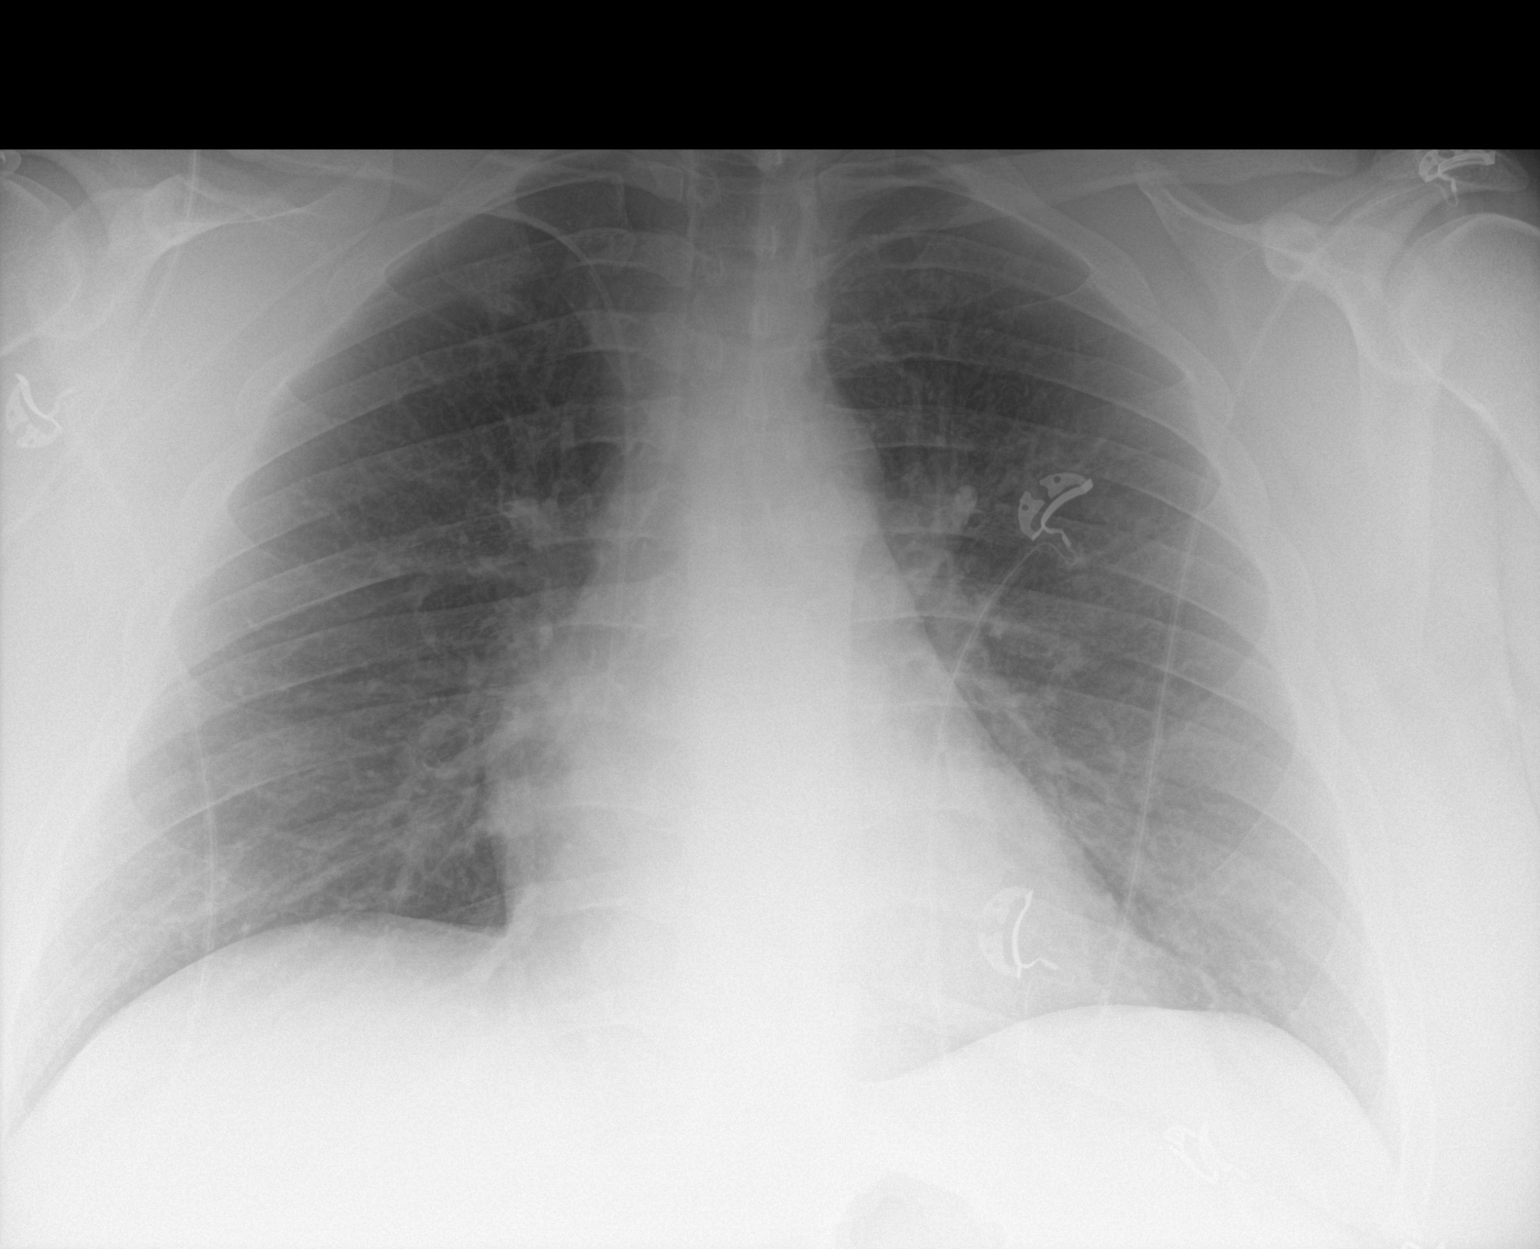

[1 of 1 positions shown; findings below may reference images not displayed]

FINDINGS: Tip of the right upper extremity PICC in the mid SVC. No
pneumothorax. The cardiomediastinal contours are normal. The lungs
are clear. Pulmonary vasculature is normal. No consolidation,
pleural effusion, or pneumothorax. No acute osseous abnormalities
are seen.
IMPRESSION: Tip of the right upper extremity PICC in the mid SVC. No
pneumothorax or acute abnormality.

## 2017-12-28 ENCOUNTER — Other Ambulatory Visit: Payer: Self-pay

## 2017-12-28 ENCOUNTER — Emergency Department
Admission: EM | Admit: 2017-12-28 | Discharge: 2017-12-28 | Disposition: A | Discharge status: Home / Self Care | Payer: Medicaid Other | Attending: Emergency Medicine | Admitting: Emergency Medicine

## 2017-12-28 DIAGNOSIS — T401X1A Poisoning by heroin, accidental (unintentional), initial encounter: Secondary | ICD-10-CM

## 2017-12-28 DIAGNOSIS — R4020 Unspecified coma: Secondary | ICD-10-CM | POA: Diagnosis present

## 2017-12-28 DIAGNOSIS — F17210 Nicotine dependence, cigarettes, uncomplicated: Secondary | ICD-10-CM | POA: Diagnosis not present

## 2017-12-28 DIAGNOSIS — Z79899 Other long term (current) drug therapy: Secondary | ICD-10-CM | POA: Diagnosis not present

## 2017-12-28 LAB — COMPREHENSIVE METABOLIC PANEL
ALT: 51 U/L (ref 17–63)
AST: 51 U/L — ABNORMAL HIGH (ref 15–41)
Albumin: 3.9 g/dL (ref 3.5–5.0)
Alkaline Phosphatase: 55 U/L (ref 38–126)
Anion gap: 8 (ref 5–15)
BUN: 15 mg/dL (ref 6–20)
CO2: 26 mmol/L (ref 22–32)
Calcium: 8.6 mg/dL — ABNORMAL LOW (ref 8.9–10.3)
Chloride: 106 mmol/L (ref 101–111)
Creatinine, Ser: 0.72 mg/dL (ref 0.61–1.24)
GFR calc Af Amer: >60 mL/min (ref >60)
GFR calc non Af Amer: >60 mL/min (ref >60)
Glucose, Bld: 123 mg/dL — ABNORMAL HIGH (ref 65–99)
Potassium: 3.8 mmol/L (ref 3.5–5.1)
Sodium: 140 mmol/L (ref 135–145)
Total Bilirubin: 0.8 mg/dL (ref 0.3–1.2)
Total Protein: 7.3 g/dL (ref 6.5–8.1)

## 2017-12-28 LAB — CBC
HCT: 44.9 % (ref 40.0–52.0)
Hemoglobin: 15.2 g/dL (ref 13.0–18.0)
MCH: 28.7 pg (ref 26.0–34.0)
MCHC: 33.9 g/dL (ref 32.0–36.0)
MCV: 84.8 fL (ref 80.0–100.0)
Platelets: 164 10*3/uL (ref 150–440)
RBC: 5.3 MIL/uL (ref 4.40–5.90)
RDW: 13.1 % (ref 11.5–14.5)
WBC: 11.8 10*3/uL — ABNORMAL HIGH (ref 3.8–10.6)

## 2017-12-28 LAB — ETHANOL: Alcohol, Ethyl (B): <10 mg/dL (ref <10)

## 2017-12-28 LAB — ACETAMINOPHEN LEVEL: Acetaminophen (Tylenol), Serum: <10 ug/mL — ABNORMAL LOW (ref 10–30)

## 2017-12-28 NOTE — ED Provider Notes (Signed)
[facility] Emergency Department Provider Note   First MD Initiated Contact with Patient 12/28/17 1208     (approximate)  I have reviewed the triage vital signs and the nursing notes.   HISTORY  Chief Complaint Drug Overdose    HPI Silas FloodRichard L Lucero Jr. is a 37 y.o. male with below listof chronic medical conditions including polysubstance abuse including heroin and cocaine. Patient presentedvia EMS with history of being found unresponsive with concern for cardiac arrest and a such CPR was initiated and continued for approximately 3 minutes. On EMS arrival patient noted to be cyanotic 2 mg of intranasal Narcan was administered at which point the patient became responsive and appropriate oxygen saturation 100% on room air following administration of Narcan and likewise on arrival to the emergency department. Patient does admit to using IV heroin this morning however he cannot state what time he did. She denied any suicidal or homicidal ideation. Patient states that this was not a suicide attempt.   Past Medical History:  Diagnosis Date  . Anxiety   . Bronchitis   . Cocaine abuse (HCC)   . Depression   . Drug abuse (HCC)   . Heroin abuse [facility](HCC)     Patient Active Problem List   Diagnosis Date Noted  . Opiate abuse, episodic (HCC) 02/07/2017  . Substance induced mood disorder (HCC) 02/07/2017  . Opiate overdose (HCC) 11/12/2016  . Bipolar 1 disorder, mixed, moderate (HCC) 05/07/2016  . Staphylococcal sepsis (HCC) 05/06/2016  . Severe recurrent major depression without psychotic features (HCC) 05/22/2015  . Opiate abuse, continuous (HCC) 05/22/2015  . Cocaine abuse (HCC) 05/22/2015    Past Surgical History:  Procedure Laterality Date  . NOSE SURGERY    . TEE WITHOUT CARDIOVERSION N/A 05/10/2016   Procedure: TRANSESOPHAGEAL ECHOCARDIOGRAM (TEE);  Surgeon: Laurier NancyShaukat A Khan, MD;  Location: [facility] ORS;  Service: Cardiovascular;  Laterality: N/A;  . TYMPANOPLASTY       Prior to Admission medications   Medication Sig Start Date End Date Taking? Authorizing Provider  amphetamine-dextroamphetamine (ADDERALL) 10 MG tablet Take 10 mg by mouth 2 (two) times daily. 01/21/17   [provider]  buprenorphine-naloxone (SUBOXONE) 8-2 MG SUBL SL tablet Place 1 tablet under the tongue 2 (two) times daily.    [provider]  clonazePAM (KLONOPIN) 1 MG tablet Take 1 mg by mouth 2 (two) times daily as needed. for anxiety 01/07/17   [provider]  hydrOXYzine (ATARAX/VISTARIL) 50 MG tablet Take 1 tablet by mouth at bedtime. 01/28/17   [provider]  traZODone (DESYREL) 50 MG tablet Take 50 mg by mouth at bedtime as needed. for sleep 01/07/17   [provider]    Allergies Bactrim [sulfamethoxazole-trimethoprim] and Sulfa antibiotics  Family History  Problem Relation Age of Onset  . Diabetes Mother   . Hypertension Father   . Kidney failure Father   . Diabetes Father     Social History Social History   Tobacco Use  . Smoking status: Current Every Day Smoker    Packs/day: 1.50    Types: Cigarettes  . Smokeless tobacco: Current User    Types: Snuff  Substance Use Topics  . Alcohol use: Not Currently  . Drug use: Yes    Comment: heroine    Review of Systems Constitutional: No fever/chills Eyes: No visual changes. ENT: No sore throat. Cardiovascular: Denies chest pain. Respiratory: Denies shortness of breath. Gastrointestinal: No abdominal pain.  No nausea, no vomiting.  No diarrhea.  No constipation. Genitourinary: Negative for dysuria. Musculoskeletal: Negative for neck pain.  Negative for back pain. Integumentary: Negative for rash. Neurological: Negative for headaches, focal weakness or numbness. Psychiatric:positive for heroin overdose  ____________________________________________   PHYSICAL EXAM:  VITAL SIGNS: ED Triage Vitals [12/28/17 1209]  Enc Vitals Group     BP 140/86     Pulse  Rate 91     Resp 16     Temp 98 F (36.7 C)     Temp Source Oral     SpO2 98 %     Weight 136.1 kg (300 lb)     Height 1.753 m (5\' 9" )     Head Circumference      Peak Flow      Pain Score 6     Pain Loc      Pain Edu?      Excl. in GC?     Constitutional: Alert and oriented. Well appearing and in no acute distress. Eyes: Conjunctivae are normal. PERRL. EOMI. Head: Atraumatic. Mouth/Throat: Mucous membranes are moist.  Oropharynx non-erythematous. Neck: No stridor.   Cardiovascular: Normal rate, regular rhythm. Good peripheral circulation. Grossly normal heart sounds. Respiratory: Normal respiratory effort.  No retractions. Lungs CTAB. Gastrointestinal: Soft and nontender. No distention.  Musculoskeletal: No lower extremity tenderness nor edema. No gross deformities of extremities. Neurologic:  Normal speech and language. No gross focal neurologic deficits are appreciated.  Skin:  Skin is warm, dry and intact. No rash noted. Psychiatric: Mood and affect are normal. Speech and behavior are normal.  ____________________________________________   LABS (all labs ordered are listed, but only abnormal results are displayed)  Labs Reviewed  CBC - Abnormal; Notable for the following components:      Result Value   WBC 11.8 (*)    All other components within normal limits  ETHANOL  COMPREHENSIVE METABOLIC PANEL  ACETAMINOPHEN LEVEL  URINE DRUG SCREEN, QUALITATIVE ([facility] ONLY)  SALICYLATE LEVEL   ____________________________________________  EKG  ED ECG REPORT I,  N Yuan Gann, the attending physician, personally viewed and interpreted this ECG.   Date: 12/28/2017  EKG Time: 11:48 AM  Rate: 105  Rhythm: Sinus tachycardia  Axis: normal  Intervals:normal  ST&T Change: none     Procedures   ____________________________________________   INITIAL IMPRESSION / ASSESSMENT AND PLAN / ED COURSE  As part of my medical decision making, I reviewed the following data  within the electronic MEDICAL RECORD NUMBER   37 year old male presenting to the emergency department following heroin overdose. Patient denied any suicidal ideation and states that this was not a suicide attempt. Patient demanded to leave the emergency department at 1:00 AM. I informed the patient had this could result in his death and permanent disability etc. however patient was adamant that he wanted to leave the emergency department. ____________________________________________  FINAL CLINICAL IMPRESSION(S) / ED DIAGNOSES  Final diagnoses:  Accidental overdose of heroin, initial encounter [facility])     MEDICATIONS GIVEN DURING THIS VISIT:  Medications - No data to display   ED Discharge Orders    None       Note:  This document was prepared using Dragon voice recognition software and may include unintentional dictation errors.    Darci Current, MD 12/28/17 1316
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
## 2017-12-28 NOTE — ED Notes (Signed)
Pt signed paper copy of AMA form 

## 2017-12-28 NOTE — ED Notes (Signed)
Pt refuses to give urine sample at this time - gave pt call bell and requested that he ring for nurse when he has to void so that sample can be collected

## 2017-12-28 NOTE — ED Notes (Signed)
Pt states that he wants to sign out and leave immediately - Dr Manson PasseyBrown notified - Dr Manson PasseyBrown spoke to pt and he remains adamant that he is leaving despite being counseled on the dangers of leaving so soon after overdose of heroine - pt was asked by this nurse if he had any thoughts of wanting to harm himself or others - pt denies SI and HI

## 2018-01-27 ENCOUNTER — Encounter: Payer: Self-pay | Admitting: Emergency Medicine

## 2018-01-27 ENCOUNTER — Emergency Department
Admission: EM | Admit: 2018-01-27 | Discharge: 2018-01-27 | Disposition: A | Discharge status: Home / Self Care | Payer: Medicaid Other | Attending: Emergency Medicine | Admitting: Emergency Medicine

## 2018-01-27 ENCOUNTER — Emergency Department: Payer: Medicaid Other

## 2018-01-27 DIAGNOSIS — Y998 Other external cause status: Secondary | ICD-10-CM | POA: Diagnosis not present

## 2018-01-27 DIAGNOSIS — Y9389 Activity, other specified: Secondary | ICD-10-CM | POA: Diagnosis not present

## 2018-01-27 DIAGNOSIS — F419 Anxiety disorder, unspecified: Secondary | ICD-10-CM | POA: Diagnosis not present

## 2018-01-27 DIAGNOSIS — F1110 Opioid abuse, uncomplicated: Secondary | ICD-10-CM | POA: Insufficient documentation

## 2018-01-27 DIAGNOSIS — S0232XA Fracture of orbital floor, left side, initial encounter for closed fracture: Secondary | ICD-10-CM | POA: Diagnosis not present

## 2018-01-27 DIAGNOSIS — S0280XA Fracture of other specified skull and facial bones, unspecified side, initial encounter for closed fracture: Secondary | ICD-10-CM

## 2018-01-27 DIAGNOSIS — F1410 Cocaine abuse, uncomplicated: Secondary | ICD-10-CM | POA: Insufficient documentation

## 2018-01-27 DIAGNOSIS — S0285XA Fracture of orbit, unspecified, initial encounter for closed fracture: Secondary | ICD-10-CM

## 2018-01-27 DIAGNOSIS — Y929 Unspecified place or not applicable: Secondary | ICD-10-CM | POA: Diagnosis not present

## 2018-01-27 DIAGNOSIS — F319 Bipolar disorder, unspecified: Secondary | ICD-10-CM | POA: Insufficient documentation

## 2018-01-27 DIAGNOSIS — W51XXXA Accidental striking against or bumped into by another person, initial encounter: Secondary | ICD-10-CM | POA: Insufficient documentation

## 2018-01-27 DIAGNOSIS — S0993XA Unspecified injury of face, initial encounter: Secondary | ICD-10-CM | POA: Diagnosis present

## 2018-01-27 DIAGNOSIS — F17210 Nicotine dependence, cigarettes, uncomplicated: Secondary | ICD-10-CM | POA: Insufficient documentation

## 2018-01-27 DIAGNOSIS — Z79899 Other long term (current) drug therapy: Secondary | ICD-10-CM | POA: Insufficient documentation

## 2018-01-27 MED ORDER — CEPHALEXIN 500 MG PO CAPS
500.0000 mg | ORAL_CAPSULE | Freq: Once | ORAL | Status: AC
  Administered 2018-01-27: 500 mg via ORAL
  Filled 2018-01-27: qty 1

## 2018-01-27 MED ORDER — CEPHALEXIN 500 MG PO CAPS: 500.0000 mg | ORAL_CAPSULE | Freq: Two times a day (BID) | ORAL | 0 refills | Status: AC

## 2018-01-27 MED ORDER — TRAMADOL HCL 50 MG PO TABS: 50.0000 mg | ORAL_TABLET | Freq: Four times a day (QID) | ORAL | 0 refills | Status: DC | PRN

## 2018-01-27 MED ORDER — TRAMADOL HCL 50 MG PO TABS
50.0000 mg | ORAL_TABLET | Freq: Once | ORAL | Status: AC
  Administered 2018-01-27: 50 mg via ORAL
  Filled 2018-01-27: qty 1

## 2018-01-27 MED ORDER — HYDROCODONE-ACETAMINOPHEN 5-325 MG PO TABS: 1.0000 | ORAL_TABLET | Freq: Once | ORAL | Status: DC
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
## 2018-07-27 ENCOUNTER — Other Ambulatory Visit: Payer: Self-pay

## 2018-07-27 ENCOUNTER — Encounter: Payer: Self-pay | Admitting: Emergency Medicine

## 2018-07-27 ENCOUNTER — Emergency Department
Admission: EM | Admit: 2018-07-27 | Discharge: 2018-07-27 | Disposition: A | Discharge status: Home / Self Care | Payer: Medicaid Other | Attending: Student in an Organized Health Care Education/Training Program | Admitting: Student in an Organized Health Care Education/Training Program

## 2018-07-27 DIAGNOSIS — Z79899 Other long term (current) drug therapy: Secondary | ICD-10-CM | POA: Insufficient documentation

## 2018-07-27 DIAGNOSIS — F1110 Opioid abuse, uncomplicated: Secondary | ICD-10-CM | POA: Diagnosis not present

## 2018-07-27 DIAGNOSIS — M542 Cervicalgia: Secondary | ICD-10-CM | POA: Diagnosis not present

## 2018-07-27 DIAGNOSIS — Y9389 Activity, other specified: Secondary | ICD-10-CM | POA: Insufficient documentation

## 2018-07-27 DIAGNOSIS — S39012A Strain of muscle, fascia and tendon of lower back, initial encounter: Secondary | ICD-10-CM | POA: Diagnosis not present

## 2018-07-27 DIAGNOSIS — F17210 Nicotine dependence, cigarettes, uncomplicated: Secondary | ICD-10-CM | POA: Insufficient documentation

## 2018-07-27 DIAGNOSIS — Y92410 Unspecified street and highway as the place of occurrence of the external cause: Secondary | ICD-10-CM | POA: Insufficient documentation

## 2018-07-27 DIAGNOSIS — Y998 Other external cause status: Secondary | ICD-10-CM | POA: Insufficient documentation

## 2018-07-27 DIAGNOSIS — F419 Anxiety disorder, unspecified: Secondary | ICD-10-CM | POA: Insufficient documentation

## 2018-07-27 DIAGNOSIS — F319 Bipolar disorder, unspecified: Secondary | ICD-10-CM | POA: Diagnosis not present

## 2018-07-27 DIAGNOSIS — S3992XA Unspecified injury of lower back, initial encounter: Secondary | ICD-10-CM | POA: Diagnosis present

## 2018-07-27 DIAGNOSIS — F1410 Cocaine abuse, uncomplicated: Secondary | ICD-10-CM | POA: Diagnosis not present

## 2018-07-27 MED ORDER — IBUPROFEN 600 MG PO TABS: 600.0000 mg | ORAL_TABLET | Freq: Four times a day (QID) | ORAL | 0 refills | Status: DC | PRN

## 2018-07-27 MED ORDER — KETOROLAC TROMETHAMINE 30 MG/ML IJ SOLN
30.0000 mg | Freq: Once | INTRAMUSCULAR | Status: AC
  Administered 2018-07-27: 30 mg via INTRAMUSCULAR
  Filled 2018-07-27: qty 1

## 2018-07-27 MED ORDER — CYCLOBENZAPRINE HCL 5 MG PO TABS: ORAL_TABLET | ORAL | 0 refills | Status: DC
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
## 2019-01-24 ENCOUNTER — Encounter: Payer: Self-pay | Admitting: Emergency Medicine

## 2019-01-24 ENCOUNTER — Emergency Department
Admission: EM | Admit: 2019-01-24 | Discharge: 2019-01-24 | Disposition: A | Discharge status: Home / Self Care | Payer: Medicaid Other | Attending: Emergency Medicine | Admitting: Emergency Medicine

## 2019-01-24 DIAGNOSIS — T402X1A Poisoning by other opioids, accidental (unintentional), initial encounter: Secondary | ICD-10-CM | POA: Diagnosis not present

## 2019-01-24 DIAGNOSIS — F17210 Nicotine dependence, cigarettes, uncomplicated: Secondary | ICD-10-CM | POA: Insufficient documentation

## 2019-01-24 DIAGNOSIS — F329 Major depressive disorder, single episode, unspecified: Secondary | ICD-10-CM | POA: Diagnosis not present

## 2019-01-24 DIAGNOSIS — T401X1A Poisoning by heroin, accidental (unintentional), initial encounter: Secondary | ICD-10-CM

## 2019-01-24 DIAGNOSIS — Z79899 Other long term (current) drug therapy: Secondary | ICD-10-CM | POA: Diagnosis not present

## 2019-01-24 MED ORDER — NALOXONE HCL 4 MG/0.1ML NA LIQD
1.0000 | Freq: Once | NASAL | Status: AC
  Administered 2019-01-24: 1 via NASAL
  Filled 2019-01-24: qty 4

## 2019-01-24 MED ORDER — NALOXONE HCL 4 MG/0.1ML NA LIQD: NASAL | 0 refills | Status: AC
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
## 2019-01-24 NOTE — ED Notes (Signed)
TTS at bedside. 

## 2019-01-24 NOTE — Discharge Instructions (Signed)
You almost died today from a heroin overdose.  You should really avoid using heroin in the future.  Keep the narcan rescue dose at home with you at all times in case this happens again, and continue to follow up with your treatment program at [facility].

## 2019-01-24 NOTE — ED Triage Notes (Signed)
Pt to ED from home. Family called EMS due to pt being unresponsive. Pt given 4 of narcan upon EMS arrival to home. Pt A&O x4 upon ED arrival.

## 2019-01-24 NOTE — ED Provider Notes (Addendum)
[facility] Emergency Department Provider Note  ____________________________________________  Time seen: Approximately 6:54 PM  I have reviewed the triage vital signs and the nursing notes.   HISTORY  Chief Complaint Drug Overdose    HPI Randy Woods. is a 38 y.o. male with a history of anxiety depression and polysubstance abuse who was brought to the ED today after a heroin overdose at home.  He was found in his room unconscious with agonal breathing.  First responders gave 4 mg of intranasal Narcan which resulted in normalization of his mental status.  Per EMS report that on arrival the patient was calm and awake and asymptomatic.  Due to a history of heroin abuse and bipolar disorder, police IVC the patient for ED evaluation.  Patient denies any symptoms at all right now.  Denies SI HI or hallucinations.  Sad persons depression score is negative.   Patient reports he has been in [facility] abuse treatment, been sober for about a year and just had a relapse today.  He describes it as a "fluke".  He is unable to identify any specific stressor or inciting event that may have caused his relapse today.   Past Medical History:  Diagnosis Date  . Anxiety   . Bronchitis   . Cocaine abuse (HCC)   . Depression   . Drug abuse (HCC)   . Heroin abuse [facility])      Patient Active Problem List   Diagnosis Date Noted  . Opiate abuse, episodic (HCC) 02/07/2017  . Substance induced mood disorder (HCC) 02/07/2017  . Opiate overdose (HCC) 11/12/2016  . Bipolar 1 disorder, mixed, moderate (HCC) 05/07/2016  . Staphylococcal sepsis (HCC) 05/06/2016  . Severe recurrent major depression without psychotic features (HCC) 05/22/2015  . Opiate abuse, continuous (HCC) 05/22/2015  . Cocaine abuse (HCC) 05/22/2015     Past Surgical History:  Procedure Laterality Date  . NOSE SURGERY    . TEE WITHOUT CARDIOVERSION N/A 05/10/2016   Procedure: TRANSESOPHAGEAL  ECHOCARDIOGRAM (TEE);  Surgeon: [name], MD;  Location: [facility] ORS;  Service: Cardiovascular;  Laterality: N/A;  . TYMPANOPLASTY       Prior to Admission medications   Medication Sig Start Date End Date Taking? Authorizing Provider  amphetamine-dextroamphetamine (ADDERALL) 10 MG tablet Take 10 mg by mouth 2 (two) times daily. 01/21/17   [provider]  buprenorphine-naloxone (SUBOXONE) 8-2 MG SUBL SL tablet Place 1 tablet under the tongue 2 (two) times daily.    [provider]  clonazePAM (KLONOPIN) 1 MG tablet Take 1 mg by mouth 2 (two) times daily as needed. for anxiety 01/07/17   [provider]  cyclobenzaprine (FLEXERIL) 5 MG tablet Take 1-2 tablets 3 times daily as needed 07/27/18   Enid Derry, PA-C  hydrOXYzine (ATARAX/VISTARIL) 50 MG tablet Take 1 tablet by mouth at bedtime. 01/28/17   [provider]  ibuprofen (ADVIL,MOTRIN) 600 MG tablet Take 1 tablet (600 mg total) by mouth every 6 (six) hours as needed. 07/27/18   Enid Derry, PA-C  traMADol (ULTRAM) 50 MG tablet Take 1 tablet (50 mg total) by mouth every 6 (six) hours as needed. 01/27/18   Minna Antis, MD  traZODone (DESYREL) 50 MG tablet Take 50 mg by mouth at bedtime as needed. for sleep 01/07/17   [provider]     Allergies Bactrim [sulfamethoxazole-trimethoprim] and Sulfa antibiotics   Family History  Problem Relation Age of Onset  . Diabetes Mother   . Hypertension Father   .  Kidney failure Father   . Diabetes Father     Social History Social History   Tobacco Use  . Smoking status: Current Every Day Smoker    Packs/day: 1.50    Types: Cigarettes  . Smokeless tobacco: Current User    Types: Snuff  Substance Use Topics  . Alcohol use: Not Currently  . Drug use: Yes    Comment: heroine    Review of Systems  Constitutional:   No fever or chills.  ENT:   No sore throat. No rhinorrhea. Cardiovascular:   No chest pain or  syncope. Respiratory:   No dyspnea or cough. Gastrointestinal:   Negative for abdominal pain, vomiting and diarrhea.  Musculoskeletal:   Negative for focal pain or swelling All other systems reviewed and are negative except as documented above in ROS and HPI.  ____________________________________________   PHYSICAL EXAM:  VITAL SIGNS: ED Triage Vitals  Enc Vitals Group     BP 01/24/19 1537 (!) 120/54     Pulse Rate 01/24/19 1537 92     Resp 01/24/19 1537 17     Temp 01/24/19 1537 98.3 F (36.8 C)     Temp Source 01/24/19 1537 Oral     SpO2 01/24/19 1537 100 %     Weight 01/24/19 1538 300 lb (136.1 kg)     Height 01/24/19 1538 5\' 8"  (1.727 m)     Head Circumference --      Peak Flow --      Pain Score 01/24/19 1538 0     Pain Loc --      Pain Edu? --      Excl. in GC? --     Vital signs reviewed, nursing assessments reviewed.   Constitutional:   Alert and oriented. Non-toxic appearance. Eyes:   Conjunctivae are normal. EOMI. PERRL. ENT      Head:   Normocephalic and atraumatic.      Nose:   No congestion/rhinnorhea.       Mouth/Throat:   MMM, no pharyngeal erythema. No peritonsillar mass.       Neck:   No meningismus. Full ROM. Hematological/Lymphatic/Immunilogical:   No cervical lymphadenopathy. Cardiovascular:   RRR. Symmetric bilateral radial and DP pulses.  No murmurs. Cap refill less than 2 seconds. Respiratory:   Normal respiratory effort without tachypnea/retractions. Breath sounds are clear and equal bilaterally. No wheezes/rales/rhonchi. Gastrointestinal:   Soft and nontender. Non distended. There is no CVA tenderness.  No rebound, rigidity, or guarding. Genitourinary:   deferred Musculoskeletal:   Normal range of motion in all extremities. No joint effusions.  No lower extremity tenderness.  No edema. Neurologic:   Normal speech and language.  Motor grossly intact. No paranoia or delusions, linear rational thought.  Goal-directed. No acute focal neurologic  deficits are appreciated.  Skin:    Skin is warm, dry and intact. No rash noted.  No petechiae, purpura, or bullae.  ____________________________________________    LABS (pertinent positives/negatives) (all labs ordered are listed, but only abnormal results are displayed) Labs Reviewed - No data to display ____________________________________________   EKG  Interpreted by me Normal sinus rhythm rate of 94, normal axis and intervals.  Normal QRS ST segments and T waves.  ____________________________________________    RADIOLOGY  No results found.  ____________________________________________   PROCEDURES Procedures  ____________________________________________    CLINICAL IMPRESSION / ASSESSMENT AND PLAN / ED COURSE  Medications ordered in the ED: Medications  naloxone (NARCAN) nasal spray 4 mg/0.1 mL (has no administration in time  range)    Pertinent labs & imaging results that were available during my care of the patient were reviewed by me and considered in my medical decision making (see chart for details).  Randy Marcos EkeL Camarena Jr. was evaluated in Emergency Department on 01/24/2019 for the symptoms described in the history of present illness. He was evaluated in the context of the global COVID-19 pandemic, which necessitated consideration that the patient might be at risk for infection with the SARS-CoV-2 virus that causes COVID-19. Institutional protocols and algorithms that pertain to the evaluation of patients at risk for COVID-19 are in a state of rapid change based on information released by regulatory bodies including the CDC and federal and state organizations. These policies and algorithms were followed during the patient's care in the ED.   Patient brought to the ED under IVC after accidental heroin overdose at home.  No signs of injection site infection or complication.  No evidence of endocarditis or other acute issue.  After prolonged observation in the ED,  mental status has remained normal well after the time that the Narcan would have worn off.  Patient seen by therapeutic triage specialist to does not find any acute safety issues.  I agree and feel the patient is medically and psychiatrically stable for discharge home.  I have reversed the IVC and recommend he follow-up with [facility] or his substance abuse treatment program on an ongoing basis.  I will provide him a take-home dose of Narcan as well as a prescription.      ____________________________________________   FINAL CLINICAL IMPRESSION(S) / ED DIAGNOSES    Final diagnoses:  Accidental overdose of heroin, initial encounter [facility](HCC)     ED Discharge Orders    None      Portions of this note were generated with dragon dictation software. Dictation errors may occur despite best attempts at proofreading.   Sharman CheekStafford, Nery Frappier, MD 01/24/19 1859    Sharman CheekStafford, Cadden Elizondo, MD 01/24/19 [phone]

## 2019-01-24 NOTE — ED Notes (Signed)
Pt asking when he can call his ride to go home. Pt informed of the need to monitor him for potential side effects from heroin. Pt continues to deny SI/HI. Pt is calm and not showing any signs of detox.

## 2019-01-24 NOTE — BH Assessment (Signed)
1929 - Patient's mother, Randy Woods returned call.   She stated she is concerned because he has been complaining about back pain and inability to void his bladder.  She reports concern about his kidney function.  She asked why he was coming home when officers instructed her to pursue involuntary commitment. TTS inquired about her perspective on his drug use - and Randy Woods stated he has been using off and on - that he is trying to get on suboxone and do it right but he keeps going back to the other stuff. When asked if she is concerned that he will kill himself, she said "not intentionally".  She is concerned that he will accidentally over dose.

## 2019-01-24 NOTE — ED Notes (Signed)
Pt given blue scrub bottoms per ok from EDP.

## 2019-01-24 NOTE — ED Notes (Signed)
IVC prior to arrival  

## 2019-04-22 ENCOUNTER — Emergency Department
Admission: EM | Admit: 2019-04-22 | Discharge: 2019-04-22 | Disposition: A | Discharge status: Home / Self Care | Payer: Medicaid Other | Attending: Emergency Medicine | Admitting: Emergency Medicine

## 2019-04-22 ENCOUNTER — Emergency Department: Payer: Medicaid Other

## 2019-04-22 ENCOUNTER — Other Ambulatory Visit: Payer: Self-pay

## 2019-04-22 DIAGNOSIS — F1910 Other psychoactive substance abuse, uncomplicated: Secondary | ICD-10-CM | POA: Insufficient documentation

## 2019-04-22 DIAGNOSIS — L539 Erythematous condition, unspecified: Secondary | ICD-10-CM | POA: Diagnosis present

## 2019-04-22 DIAGNOSIS — M79662 Pain in left lower leg: Secondary | ICD-10-CM

## 2019-04-22 DIAGNOSIS — F17210 Nicotine dependence, cigarettes, uncomplicated: Secondary | ICD-10-CM | POA: Insufficient documentation

## 2019-04-22 DIAGNOSIS — Z79899 Other long term (current) drug therapy: Secondary | ICD-10-CM | POA: Diagnosis not present

## 2019-04-22 DIAGNOSIS — L03114 Cellulitis of left upper limb: Secondary | ICD-10-CM | POA: Insufficient documentation

## 2019-04-22 LAB — CBC WITH DIFFERENTIAL/PLATELET
Abs Immature Granulocytes: 0.03 10*3/uL (ref 0.00–0.07)
Basophils Absolute: 0 10*3/uL (ref 0.0–0.1)
Basophils Relative: 0 %
Eosinophils Absolute: 0.1 10*3/uL (ref 0.0–0.5)
Eosinophils Relative: 2 %
HCT: 39.7 % (ref 39.0–52.0)
Hemoglobin: 13.3 g/dL (ref 13.0–17.0)
Immature Granulocytes: 0 %
Lymphocytes Relative: 35 %
Lymphs Abs: 2.4 10*3/uL (ref 0.7–4.0)
MCH: 29 pg (ref 26.0–34.0)
MCHC: 33.5 g/dL (ref 30.0–36.0)
MCV: 86.7 fL (ref 80.0–100.0)
Monocytes Absolute: 0.5 10*3/uL (ref 0.1–1.0)
Monocytes Relative: 7 %
Neutro Abs: 3.8 10*3/uL (ref 1.7–7.7)
Neutrophils Relative %: 56 %
Platelets: 144 10*3/uL — ABNORMAL LOW (ref 150–400)
RBC: 4.58 MIL/uL (ref 4.22–5.81)
RDW: 11.7 % (ref 11.5–15.5)
WBC: 6.9 10*3/uL (ref 4.0–10.5)
nRBC: 0 % (ref 0.0–0.2)

## 2019-04-22 LAB — COMPREHENSIVE METABOLIC PANEL
ALT: 64 U/L — ABNORMAL HIGH (ref 0–44)
AST: 42 U/L — ABNORMAL HIGH (ref 15–41)
Albumin: 3.9 g/dL (ref 3.5–5.0)
Alkaline Phosphatase: 56 U/L (ref 38–126)
Anion gap: 11 (ref 5–15)
BUN: 12 mg/dL (ref 6–20)
CO2: 24 mmol/L (ref 22–32)
Calcium: 8.5 mg/dL — ABNORMAL LOW (ref 8.9–10.3)
Chloride: 104 mmol/L (ref 98–111)
Creatinine, Ser: 0.8 mg/dL (ref 0.61–1.24)
GFR calc Af Amer: >60 mL/min (ref >60)
GFR calc non Af Amer: >60 mL/min (ref >60)
Glucose, Bld: 155 mg/dL — ABNORMAL HIGH (ref 70–99)
Potassium: 3.2 mmol/L — ABNORMAL LOW (ref 3.5–5.1)
Sodium: 139 mmol/L (ref 135–145)
Total Bilirubin: 0.7 mg/dL (ref 0.3–1.2)
Total Protein: 7.2 g/dL (ref 6.5–8.1)

## 2019-04-22 LAB — URINE DRUG SCREEN, QUALITATIVE (ARMC ONLY)
Amphetamines, Ur Screen: POSITIVE — AB
Barbiturates, Ur Screen: NOT DETECTED
Benzodiazepine, Ur Scrn: POSITIVE — AB
Cannabinoid 50 Ng, Ur ~~LOC~~: POSITIVE — AB
Cocaine Metabolite,Ur ~~LOC~~: POSITIVE — AB
MDMA (Ecstasy)Ur Screen: NOT DETECTED
Methadone Scn, Ur: POSITIVE — AB
Opiate, Ur Screen: POSITIVE — AB
Phencyclidine (PCP) Ur S: NOT DETECTED
Tricyclic, Ur Screen: NOT DETECTED

## 2019-04-22 LAB — LACTIC ACID, PLASMA: Lactic Acid, Venous: 2.4 mmol/L (ref 0.5–1.9)

## 2019-04-22 MED ORDER — CEPHALEXIN 500 MG PO CAPS: 500.0000 mg | ORAL_CAPSULE | Freq: Four times a day (QID) | ORAL | 0 refills | Status: DC

## 2019-04-22 MED ORDER — DOXYCYCLINE HYCLATE 100 MG PO TABS
100.0000 mg | ORAL_TABLET | Freq: Once | ORAL | Status: AC
  Administered 2019-04-22: 05:00:00 100 mg via ORAL
  Filled 2019-04-22: qty 1

## 2019-04-22 MED ORDER — CEPHALEXIN 500 MG PO CAPS
500.0000 mg | ORAL_CAPSULE | Freq: Once | ORAL | Status: AC
  Administered 2019-04-22: 500 mg via ORAL
  Filled 2019-04-22: qty 1

## 2019-04-22 MED ORDER — DOXYCYCLINE HYCLATE 100 MG PO CAPS: 100.0000 mg | ORAL_CAPSULE | Freq: Two times a day (BID) | ORAL | 0 refills | Status: AC

## 2019-04-22 NOTE — ED Notes (Addendum)
Pt presents tonight with c/o "abscess" to left forearm; area of redness and warmth noted; no swelling or puncture/insect bite visualized; pt admits to relapsing in the last week and shooting up Heroin but denies using that specific area; last used today; pt adds c/o of left lower back pain with pain that radiates down the back of his left leg; history of sciatica and says this feels similar; some edema noted to left lower leg; family history of DVT; pt says he relapsed in the last week due to stress at home; pt lives in a one bathroom house with his mother, his sister and her boyfriend, his common law wife and their young child; pt says they fight about the smallest of things and it's been stressful; girlfriend is at bedside and says they also live with a house of addicts and that does not help patient; pt says he just generally does not feel well; awake and alert, talking in complete coherent sentences

## 2019-04-22 NOTE — ED Notes (Signed)
Pt's oxygen saturation actually 98% on room air

## 2019-04-22 NOTE — Discharge Instructions (Addendum)
As we discussed, we are starting you on 2 different antibiotics for the cellulitis and probable developing abscess on your left forearm.  It is very important you take the full course of treatment of both medications.  We recommend you use cold packs and keep the arm elevated when possible.  At this point there is nothing to drain, but it may develop into a full abscess that requires incision and drainage.  If that happens or you develop new or worsening symptoms that concern you, please return to the emergency department.  Drink plenty of fluids and please try to avoid drug use.  Given your ongoing history of IV drug abuse, we sent blood cultures, and you will be contacted if there is an indication that you have bacteria in your blood.    Regarding your left leg pain, we offered to obtain an ultrasound to rule out DVT but you chose to leave.  That is fine but please follow-up with your primary care doctor for further evaluation or, again, return to the emergency department with new or worsening symptoms.

## 2019-04-22 NOTE — ED Notes (Signed)
Lab called to say repeat lactic acid hemolyzed; MD aware and states pt wants to leave; cancelling repeat lab as well as ordered US

## 2019-04-22 NOTE — ED Notes (Signed)
Dr. Forbach at bedside.  

## 2019-04-22 NOTE — ED Notes (Signed)
Water, crackers and peanut butter given to visitor; pt understands nothing to eat or drink until seen by provider

## 2019-04-22 NOTE — ED Notes (Signed)
Pt's oxygen saturation is actually 99% on room air

## 2019-04-22 NOTE — ED Triage Notes (Addendum)
Patient c/o abscess left forearm. Redness swelling seen to site. Patient reports he is an IV drug user. Patient c/o left leg pain/swelling. Patient worried about possibility of DVT. Patient reports family hx of drug clots.

## 2019-04-22 NOTE — ED Notes (Signed)
Spoke with provider regarding any orders needed for pt; none entered at this time;
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
## 2019-04-22 NOTE — ED Notes (Signed)
Pt's girlfriend/wife in lobby cursing at first nurse regarding visitor policy. Pt's visitor left, came back and wanted to return to room. Policy of not being able to return to room once left reviewed with visitor who continues to curse. Visitor asking first Rn to ask pt if he wants her to stay in car or go home at this time. This nurse back to room to ask pt question relayed from visitor. Pt states "get me my fucking script, i'm getting the fuck outta here". MD notified that pt wishes to leave. Visitor updated on pt's discharge plan. Pt's visitor begins to walk away talking, this RN thought pt's visitor thanked this RN for information and responded with "you're welcome". Visitor turned around and said "you're welcome, I didn't thank you for nuthin bitch"

## 2019-04-27 LAB — CULTURE, BLOOD (ROUTINE X 2)
Culture: NO GROWTH
Culture: NO GROWTH
Special Requests: ADEQUATE

## 2019-08-12 ENCOUNTER — Emergency Department: Payer: Medicaid Other

## 2019-08-12 ENCOUNTER — Other Ambulatory Visit: Payer: Self-pay

## 2019-08-12 ENCOUNTER — Emergency Department
Admission: EM | Admit: 2019-08-12 | Discharge: 2019-08-12 | Disposition: A | Discharge status: Home / Self Care | Payer: Medicaid Other | Attending: Emergency Medicine | Admitting: Emergency Medicine

## 2019-08-12 ENCOUNTER — Encounter: Payer: Self-pay | Admitting: Emergency Medicine

## 2019-08-12 DIAGNOSIS — F17210 Nicotine dependence, cigarettes, uncomplicated: Secondary | ICD-10-CM | POA: Diagnosis not present

## 2019-08-12 DIAGNOSIS — L03116 Cellulitis of left lower limb: Secondary | ICD-10-CM | POA: Insufficient documentation

## 2019-08-12 DIAGNOSIS — F17220 Nicotine dependence, chewing tobacco, uncomplicated: Secondary | ICD-10-CM | POA: Insufficient documentation

## 2019-08-12 DIAGNOSIS — Z79899 Other long term (current) drug therapy: Secondary | ICD-10-CM | POA: Insufficient documentation

## 2019-08-12 DIAGNOSIS — M79605 Pain in left leg: Secondary | ICD-10-CM | POA: Diagnosis present

## 2019-08-12 LAB — CBC WITH DIFFERENTIAL/PLATELET
Abs Immature Granulocytes: 0.04 10*3/uL (ref 0.00–0.07)
Basophils Absolute: 0 10*3/uL (ref 0.0–0.1)
Basophils Relative: 1 %
Eosinophils Absolute: 0.1 10*3/uL (ref 0.0–0.5)
Eosinophils Relative: 1 %
HCT: 43.9 % (ref 39.0–52.0)
Hemoglobin: 14.9 g/dL (ref 13.0–17.0)
Immature Granulocytes: 1 %
Lymphocytes Relative: 27 %
Lymphs Abs: 2.3 10*3/uL (ref 0.7–4.0)
MCH: 28.4 pg (ref 26.0–34.0)
MCHC: 33.9 g/dL (ref 30.0–36.0)
MCV: 83.8 fL (ref 80.0–100.0)
Monocytes Absolute: 0.6 10*3/uL (ref 0.1–1.0)
Monocytes Relative: 6 %
Neutro Abs: 5.5 10*3/uL (ref 1.7–7.7)
Neutrophils Relative %: 64 %
Platelets: 176 10*3/uL (ref 150–400)
RBC: 5.24 MIL/uL (ref 4.22–5.81)
RDW: 12.2 % (ref 11.5–15.5)
WBC: 8.6 10*3/uL (ref 4.0–10.5)
nRBC: 0 % (ref 0.0–0.2)

## 2019-08-12 LAB — COMPREHENSIVE METABOLIC PANEL
ALT: 51 U/L — ABNORMAL HIGH (ref 0–44)
AST: 34 U/L (ref 15–41)
Albumin: 4.2 g/dL (ref 3.5–5.0)
Alkaline Phosphatase: 60 U/L (ref 38–126)
Anion gap: 12 (ref 5–15)
BUN: 15 mg/dL (ref 6–20)
CO2: 22 mmol/L (ref 22–32)
Calcium: 8.9 mg/dL (ref 8.9–10.3)
Chloride: 105 mmol/L (ref 98–111)
Creatinine, Ser: 0.73 mg/dL (ref 0.61–1.24)
GFR calc Af Amer: >60 mL/min (ref >60)
GFR calc non Af Amer: >60 mL/min (ref >60)
Glucose, Bld: 106 mg/dL — ABNORMAL HIGH (ref 70–99)
Potassium: 4.1 mmol/L (ref 3.5–5.1)
Sodium: 139 mmol/L (ref 135–145)
Total Bilirubin: 0.7 mg/dL (ref 0.3–1.2)
Total Protein: 8.2 g/dL — ABNORMAL HIGH (ref 6.5–8.1)

## 2019-08-12 MED ORDER — DOXYCYCLINE HYCLATE 100 MG PO TABS
100.0000 mg | ORAL_TABLET | Freq: Once | ORAL | Status: AC
  Administered 2019-08-12: 23:00:00 100 mg via ORAL
  Filled 2019-08-12: qty 1

## 2019-08-12 MED ORDER — OXYCODONE-ACETAMINOPHEN 5-325 MG PO TABS
1.0000 | ORAL_TABLET | Freq: Once | ORAL | Status: AC
  Administered 2019-08-12: 1 via ORAL
  Filled 2019-08-12: qty 1

## 2019-08-12 MED ORDER — DOXYCYCLINE HYCLATE 100 MG PO CAPS: 100.0000 mg | ORAL_CAPSULE | Freq: Two times a day (BID) | ORAL | 0 refills | Status: AC

## 2019-08-12 MED ORDER — NAPROXEN 500 MG PO TABS: 500.0000 mg | ORAL_TABLET | Freq: Two times a day (BID) | ORAL | 0 refills | Status: AC

## 2019-08-12 NOTE — ED Provider Notes (Signed)
[facility] Emergency Department Provider Note   ____________________________________________   First MD Initiated Contact with Patient 08/12/19 2155     (approximate)  I have reviewed the triage vital signs and the nursing notes.   HISTORY  Chief Complaint Wound Check    HPI Randy FloodRichard L Arnett Jr. is a 38 y.o. male with possible history of polysubstance abuse who presents to the ED complaining of leg pain and swelling.  Patient reports that approximately 3 days ago he attempted to inject IV drugs into a vein in his left calf.  Shortly afterward, he developed increased pain and swelling as well as overlying redness.  Area of redness has increased over the past couple of days along with increased pain and some pus.  Area is tender to touch but he is able to bear weight on his left leg.  He denies any fevers, chills, nausea, vomiting, chest pain, cough, or shortness of breath.  He has not taken anything for this prior to arrival.        Past Medical History:  Diagnosis Date   Anxiety    Bronchitis    Cocaine abuse (HCC)    Depression    Drug abuse (HCC)    Heroin abuse (HCC)     Patient Active Problem List   Diagnosis Date Noted   Opiate abuse, episodic (HCC) 02/07/2017   Substance induced mood disorder (HCC) 02/07/2017   Opiate overdose (HCC) 11/12/2016   Bipolar 1 disorder, mixed, moderate (HCC) 05/07/2016   Staphylococcal sepsis (HCC) 05/06/2016   Severe recurrent major depression without psychotic features (HCC) 05/22/2015   Opiate abuse, continuous (HCC) 05/22/2015   Cocaine abuse (HCC) 05/22/2015    Past Surgical History:  Procedure Laterality Date   NOSE SURGERY     TEE WITHOUT CARDIOVERSION N/A 05/10/2016   Procedure: TRANSESOPHAGEAL ECHOCARDIOGRAM (TEE);  Surgeon: Laurier NancyShaukat A Khan, MD;  Location: [facility] ORS;  Service: Cardiovascular;  Laterality: N/A;   TYMPANOPLASTY      Prior to Admission medications   Medication Sig  Start Date End Date Taking? Authorizing Provider  amphetamine-dextroamphetamine (ADDERALL) 10 MG tablet Take 10 mg by mouth 2 (two) times daily. 01/21/17   [provider]  buprenorphine-naloxone (SUBOXONE) 8-2 MG SUBL SL tablet Place 1 tablet under the tongue 2 (two) times daily.    [provider]  cephALEXin (KEFLEX) 500 MG capsule Take 1 capsule (500 mg total) by mouth 4 (four) times daily. 04/22/19   Loleta RoseForbach, Cory, MD  clonazePAM (KLONOPIN) 1 MG tablet Take 1 mg by mouth 2 (two) times daily as needed. for anxiety 01/07/17   [provider]  cyclobenzaprine (FLEXERIL) 5 MG tablet Take 1-2 tablets 3 times daily as needed 07/27/18   Enid DerryWagner, Ashley, PA-C  doxycycline (VIBRAMYCIN) 100 MG capsule Take 1 capsule (100 mg total) by mouth 2 (two) times daily for 7 days. 08/12/19 08/19/19  Chesley NoonJessup, Male Minish, MD  hydrOXYzine (ATARAX/VISTARIL) 50 MG tablet Take 1 tablet by mouth at bedtime. 01/28/17   [provider]  ibuprofen (ADVIL,MOTRIN) 600 MG tablet Take 1 tablet (600 mg total) by mouth every 6 (six) hours as needed. 07/27/18   Enid DerryWagner, Ashley, PA-C  naloxone [facility](NARCAN) nasal spray 4 mg/0.1 mL Keep accessible in case of overdose. 01/24/19   Sharman CheekStafford, Phillip, MD  naproxen (NAPROSYN) 500 MG tablet Take 1 tablet (500 mg total) by mouth 2 (two) times daily with a meal for 6 days. 08/12/19 08/18/19  Chesley NoonJessup, Maclovio Henson, MD  traMADol (ULTRAM) 50 MG tablet  Take 1 tablet (50 mg total) by mouth every 6 (six) hours as needed. 01/27/18   Harvest Dark, MD  traZODone (DESYREL) 50 MG tablet Take 50 mg by mouth at bedtime as needed. for sleep 01/07/17   [provider]    Allergies Bactrim [sulfamethoxazole-trimethoprim] and Sulfa antibiotics  Family History  Problem Relation Age of Onset   Diabetes Mother    Hypertension Father    Kidney failure Father    Diabetes Father     Social History Social History   Tobacco Use   Smoking status: Current Every Day Smoker     Packs/day: 1.50    Types: Cigarettes   Smokeless tobacco: Current User    Types: Snuff  Substance Use Topics   Alcohol use: Not Currently   Drug use: Yes    Types: IV    Comment: heroine    Review of Systems  Constitutional: No fever/chills Eyes: No visual changes. ENT: No sore throat. Cardiovascular: Denies chest pain. Respiratory: Denies shortness of breath. Gastrointestinal: No abdominal pain.  No nausea, no vomiting.  No diarrhea.  No constipation. Genitourinary: Negative for dysuria. Musculoskeletal: Negative for back pain. Skin: Negative for rash.  Positive for wound. Neurological: Negative for headaches, focal weakness or numbness.  ____________________________________________   PHYSICAL EXAM:  VITAL SIGNS: ED Triage Vitals  Enc Vitals Group     BP 08/12/19 2109 98/68     Pulse Rate 08/12/19 2109 89     Resp 08/12/19 2109 20     Temp 08/12/19 2109 98.8 F (37.1 C)     Temp Source 08/12/19 2109 Oral     SpO2 08/12/19 2109 94 %     Weight 08/12/19 2111 (!) 320 lb (145.2 kg)     Height 08/12/19 2111 5\' 9"  (1.753 m)     Head Circumference --      Peak Flow --      Pain Score 08/12/19 2109 8     Pain Loc --      Pain Edu? --      Excl. in [place]? --     Constitutional: Alert and oriented. Eyes: Conjunctivae are normal. Head: Atraumatic. Nose: No congestion/rhinnorhea. Mouth/Throat: Mucous membranes are moist. Neck: Normal ROM Cardiovascular: Normal rate, regular rhythm. Grossly normal heart sounds. Respiratory: Normal respiratory effort.  No retractions. Lungs CTAB. Gastrointestinal: Soft and nontender. No distention. Genitourinary: deferred Musculoskeletal: No lower extremity tenderness nor edema. Neurologic:  Normal speech and language. No gross focal neurologic deficits are appreciated. Skin: Area of erythema, warmth, and central purulence noted to medial portion of left calf with diffuse tenderness to palpation but no focal fluctuance. Psychiatric:  Mood and affect are normal. Speech and behavior are normal.  ____________________________________________   LABS (all labs ordered are listed, but only abnormal results are displayed)  Labs Reviewed  COMPREHENSIVE METABOLIC PANEL - Abnormal; Notable for the following components:      Result Value   Glucose, Bld 106 (*)    Total Protein 8.2 (*)    ALT 51 (*)    All other components within normal limits  CBC WITH DIFFERENTIAL/PLATELET     PROCEDURES  Procedure(s) performed (including Critical Care):  Procedures   ____________________________________________   INITIAL IMPRESSION / ASSESSMENT AND PLAN / ED COURSE       38 year old male with history of polysubstance abuse presents to the ED with increasing area of pain, swelling, and redness to his left medial calf since injecting recreational drugs to this area.  Area appears  consistent with cellulitis, no evidence of foreign body on imaging and there is no evidence of focal abscess on exam.  We will treat with doxycycline to cover for MRSA given his IV drug abuse although there are no signs of systemic illness or bacteremia.  Counseled patient to continue antibiotics until done and follow-up with his PCP, otherwise return to the ED for new or worsening symptoms.  Patient agrees to plan.      ____________________________________________   FINAL CLINICAL IMPRESSION(S) / ED DIAGNOSES  Final diagnoses:  Cellulitis of left lower extremity     ED Discharge Orders         Ordered    doxycycline (VIBRAMYCIN) 100 MG capsule  2 times daily     08/12/19 2304    naproxen (NAPROSYN) 500 MG tablet  2 times daily with meals     08/12/19 2305           Note:  This document was prepared using Dragon voice recognition software and may include unintentional dictation errors.   Chesley Noon, MD 08/12/19 2358

## 2019-08-12 NOTE — ED Notes (Signed)
Dr Jessup at bedside 
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
## 2019-10-23 ENCOUNTER — Encounter: Payer: Self-pay | Admitting: Emergency Medicine

## 2019-10-23 ENCOUNTER — Emergency Department: Payer: Medicaid Other

## 2019-10-23 ENCOUNTER — Emergency Department
Admission: EM | Admit: 2019-10-23 | Discharge: 2019-10-24 | Disposition: A | Discharge status: Home / Self Care | Payer: Medicaid Other | Attending: Emergency Medicine | Admitting: Emergency Medicine

## 2019-10-23 ENCOUNTER — Other Ambulatory Visit: Payer: Self-pay

## 2019-10-23 DIAGNOSIS — Y999 Unspecified external cause status: Secondary | ICD-10-CM | POA: Diagnosis not present

## 2019-10-23 DIAGNOSIS — M25552 Pain in left hip: Secondary | ICD-10-CM | POA: Insufficient documentation

## 2019-10-23 DIAGNOSIS — R519 Headache, unspecified: Secondary | ICD-10-CM | POA: Insufficient documentation

## 2019-10-23 DIAGNOSIS — Y9389 Activity, other specified: Secondary | ICD-10-CM | POA: Insufficient documentation

## 2019-10-23 DIAGNOSIS — S80812A Abrasion, left lower leg, initial encounter: Secondary | ICD-10-CM | POA: Diagnosis not present

## 2019-10-23 DIAGNOSIS — S0101XA Laceration without foreign body of scalp, initial encounter: Secondary | ICD-10-CM | POA: Insufficient documentation

## 2019-10-23 DIAGNOSIS — F17210 Nicotine dependence, cigarettes, uncomplicated: Secondary | ICD-10-CM | POA: Insufficient documentation

## 2019-10-23 DIAGNOSIS — T148XXA Other injury of unspecified body region, initial encounter: Secondary | ICD-10-CM

## 2019-10-23 DIAGNOSIS — S40811A Abrasion of right upper arm, initial encounter: Secondary | ICD-10-CM | POA: Insufficient documentation

## 2019-10-23 DIAGNOSIS — S20319A Abrasion of unspecified front wall of thorax, initial encounter: Secondary | ICD-10-CM | POA: Insufficient documentation

## 2019-10-23 DIAGNOSIS — S0990XA Unspecified injury of head, initial encounter: Secondary | ICD-10-CM

## 2019-10-23 DIAGNOSIS — M549 Dorsalgia, unspecified: Secondary | ICD-10-CM | POA: Diagnosis not present

## 2019-10-23 DIAGNOSIS — F1110 Opioid abuse, uncomplicated: Secondary | ICD-10-CM | POA: Diagnosis not present

## 2019-10-23 DIAGNOSIS — Y92410 Unspecified street and highway as the place of occurrence of the external cause: Secondary | ICD-10-CM | POA: Insufficient documentation

## 2019-10-23 DIAGNOSIS — S40812A Abrasion of left upper arm, initial encounter: Secondary | ICD-10-CM | POA: Insufficient documentation

## 2019-10-23 DIAGNOSIS — S79912A Unspecified injury of left hip, initial encounter: Secondary | ICD-10-CM | POA: Insufficient documentation

## 2019-10-23 DIAGNOSIS — S80811A Abrasion, right lower leg, initial encounter: Secondary | ICD-10-CM | POA: Insufficient documentation

## 2019-10-23 LAB — CBC WITH DIFFERENTIAL/PLATELET
Abs Immature Granulocytes: 0.03 10*3/uL (ref 0.00–0.07)
Basophils Absolute: 0 10*3/uL (ref 0.0–0.1)
Basophils Relative: 1 %
Eosinophils Absolute: 0.1 10*3/uL (ref 0.0–0.5)
Eosinophils Relative: 1 %
HCT: 42.8 % (ref 39.0–52.0)
Hemoglobin: 14.5 g/dL (ref 13.0–17.0)
Immature Granulocytes: 0 %
Lymphocytes Relative: 29 %
Lymphs Abs: 2.5 10*3/uL (ref 0.7–4.0)
MCH: 29.1 pg (ref 26.0–34.0)
MCHC: 33.9 g/dL (ref 30.0–36.0)
MCV: 85.8 fL (ref 80.0–100.0)
Monocytes Absolute: 0.6 10*3/uL (ref 0.1–1.0)
Monocytes Relative: 6 %
Neutro Abs: 5.5 10*3/uL (ref 1.7–7.7)
Neutrophils Relative %: 63 %
Platelets: 173 10*3/uL (ref 150–400)
RBC: 4.99 MIL/uL (ref 4.22–5.81)
RDW: 12.3 % (ref 11.5–15.5)
WBC: 8.7 10*3/uL (ref 4.0–10.5)
nRBC: 0 % (ref 0.0–0.2)

## 2019-10-23 LAB — COMPREHENSIVE METABOLIC PANEL
ALT: 98 U/L — ABNORMAL HIGH (ref 0–44)
AST: 72 U/L — ABNORMAL HIGH (ref 15–41)
Albumin: 4.3 g/dL (ref 3.5–5.0)
Alkaline Phosphatase: 62 U/L (ref 38–126)
Anion gap: 10 (ref 5–15)
BUN: 13 mg/dL (ref 6–20)
CO2: 26 mmol/L (ref 22–32)
Calcium: 9 mg/dL (ref 8.9–10.3)
Chloride: 101 mmol/L (ref 98–111)
Creatinine, Ser: 0.83 mg/dL (ref 0.61–1.24)
GFR calc Af Amer: >60 mL/min (ref >60)
GFR calc non Af Amer: >60 mL/min (ref >60)
Glucose, Bld: 103 mg/dL — ABNORMAL HIGH (ref 70–99)
Potassium: 4.3 mmol/L (ref 3.5–5.1)
Sodium: 137 mmol/L (ref 135–145)
Total Bilirubin: 0.9 mg/dL (ref 0.3–1.2)
Total Protein: 8.1 g/dL (ref 6.5–8.1)

## 2019-10-23 LAB — ETHANOL: Alcohol, Ethyl (B): 18 mg/dL — ABNORMAL HIGH (ref <10)

## 2019-10-23 LAB — BRAIN NATRIURETIC PEPTIDE: B Natriuretic Peptide: 13 pg/mL (ref 0.0–100.0)

## 2019-10-23 LAB — TROPONIN I (HIGH SENSITIVITY): Troponin I (High Sensitivity): 5 ng/L (ref <18)

## 2019-10-23 MED ORDER — NALOXONE HCL 0.4 MG/ML IJ SOLN
0.2000 mg | Freq: Once | INTRAMUSCULAR | Status: AC
  Administered 2019-10-23: 0.2 mg via INTRAVENOUS
  Filled 2019-10-23: qty 1

## 2019-10-23 MED ORDER — MUPIROCIN 2 % EX OINT: TOPICAL_OINTMENT | CUTANEOUS | 0 refills | Status: DC

## 2019-10-23 MED ORDER — CEPHALEXIN 500 MG PO CAPS: 500.0000 mg | ORAL_CAPSULE | Freq: Four times a day (QID) | ORAL | 0 refills | Status: AC
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
## 2019-10-23 NOTE — ED Notes (Signed)
Pt transported to XR.  

## 2019-10-23 NOTE — ED Notes (Signed)
Pt denies wanting resources for detox.
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
## 2019-10-23 NOTE — ED Notes (Signed)
C- collar in place at this time

## 2019-10-24 MED ORDER — IBUPROFEN 200 MG PO TABS: 400.0000 mg | ORAL_TABLET | Freq: Four times a day (QID) | ORAL | 2 refills | Status: AC | PRN

## 2020-01-29 ENCOUNTER — Emergency Department: Payer: Medicaid Other

## 2020-01-29 ENCOUNTER — Other Ambulatory Visit: Payer: Self-pay

## 2020-01-29 ENCOUNTER — Emergency Department
Admission: EM | Admit: 2020-01-29 | Discharge: 2020-01-29 | Discharge status: Against Medical Advice | Payer: Medicaid Other | Attending: Emergency Medicine | Admitting: Emergency Medicine

## 2020-01-29 ENCOUNTER — Encounter: Payer: Self-pay | Admitting: Emergency Medicine

## 2020-01-29 DIAGNOSIS — F3132 Bipolar disorder, current episode depressed, moderate: Secondary | ICD-10-CM

## 2020-01-29 DIAGNOSIS — F1110 Opioid abuse, uncomplicated: Secondary | ICD-10-CM | POA: Diagnosis present

## 2020-01-29 DIAGNOSIS — F332 Major depressive disorder, recurrent severe without psychotic features: Secondary | ICD-10-CM | POA: Diagnosis not present

## 2020-01-29 DIAGNOSIS — Z5329 Procedure and treatment not carried out because of patient's decision for other reasons: Secondary | ICD-10-CM | POA: Diagnosis not present

## 2020-01-29 DIAGNOSIS — F209 Schizophrenia, unspecified: Secondary | ICD-10-CM | POA: Insufficient documentation

## 2020-01-29 DIAGNOSIS — F1910 Other psychoactive substance abuse, uncomplicated: Secondary | ICD-10-CM | POA: Diagnosis not present

## 2020-01-29 DIAGNOSIS — F17210 Nicotine dependence, cigarettes, uncomplicated: Secondary | ICD-10-CM | POA: Insufficient documentation

## 2020-01-29 DIAGNOSIS — F3162 Bipolar disorder, current episode mixed, moderate: Secondary | ICD-10-CM | POA: Diagnosis present

## 2020-01-29 DIAGNOSIS — A412 Sepsis due to unspecified staphylococcus: Secondary | ICD-10-CM | POA: Diagnosis present

## 2020-01-29 DIAGNOSIS — F1290 Cannabis use, unspecified, uncomplicated: Secondary | ICD-10-CM

## 2020-01-29 DIAGNOSIS — Z79899 Other long term (current) drug therapy: Secondary | ICD-10-CM | POA: Diagnosis not present

## 2020-01-29 DIAGNOSIS — T40601A Poisoning by unspecified narcotics, accidental (unintentional), initial encounter: Secondary | ICD-10-CM | POA: Diagnosis present

## 2020-01-29 DIAGNOSIS — F1410 Cocaine abuse, uncomplicated: Secondary | ICD-10-CM | POA: Diagnosis present

## 2020-01-29 DIAGNOSIS — Z20822 Contact with and (suspected) exposure to covid-19: Secondary | ICD-10-CM | POA: Insufficient documentation

## 2020-01-29 DIAGNOSIS — Z046 Encounter for general psychiatric examination, requested by authority: Secondary | ICD-10-CM | POA: Diagnosis present

## 2020-01-29 DIAGNOSIS — F1994 Other psychoactive substance use, unspecified with psychoactive substance-induced mood disorder: Secondary | ICD-10-CM | POA: Diagnosis present

## 2020-01-29 HISTORY — DX: Schizophrenia, unspecified: F20.9

## 2020-01-29 HISTORY — DX: Bipolar disorder, unspecified: F31.9

## 2020-01-29 LAB — URINE DRUG SCREEN, QUALITATIVE (ARMC ONLY)
Amphetamines, Ur Screen: POSITIVE — AB
Barbiturates, Ur Screen: NOT DETECTED
Benzodiazepine, Ur Scrn: POSITIVE — AB
Cannabinoid 50 Ng, Ur ~~LOC~~: POSITIVE — AB
Cocaine Metabolite,Ur ~~LOC~~: NOT DETECTED
MDMA (Ecstasy)Ur Screen: NOT DETECTED
Methadone Scn, Ur: POSITIVE — AB
Opiate, Ur Screen: POSITIVE — AB
Phencyclidine (PCP) Ur S: NOT DETECTED
Tricyclic, Ur Screen: NOT DETECTED

## 2020-01-29 LAB — COMPREHENSIVE METABOLIC PANEL
ALT: 51 U/L — ABNORMAL HIGH (ref 0–44)
AST: 38 U/L (ref 15–41)
Albumin: 4.2 g/dL (ref 3.5–5.0)
Alkaline Phosphatase: 64 U/L (ref 38–126)
Anion gap: 10 (ref 5–15)
BUN: 14 mg/dL (ref 6–20)
CO2: 26 mmol/L (ref 22–32)
Calcium: 9 mg/dL (ref 8.9–10.3)
Chloride: 102 mmol/L (ref 98–111)
Creatinine, Ser: 0.82 mg/dL (ref 0.61–1.24)
GFR calc Af Amer: >60 mL/min (ref >60)
GFR calc non Af Amer: >60 mL/min (ref >60)
Glucose, Bld: 117 mg/dL — ABNORMAL HIGH (ref 70–99)
Potassium: 3.9 mmol/L (ref 3.5–5.1)
Sodium: 138 mmol/L (ref 135–145)
Total Bilirubin: 1 mg/dL (ref 0.3–1.2)
Total Protein: 8.2 g/dL — ABNORMAL HIGH (ref 6.5–8.1)

## 2020-01-29 LAB — CBC
HCT: 41.3 % (ref 39.0–52.0)
Hemoglobin: 14.4 g/dL (ref 13.0–17.0)
MCH: 29.1 pg (ref 26.0–34.0)
MCHC: 34.9 g/dL (ref 30.0–36.0)
MCV: 83.4 fL (ref 80.0–100.0)
Platelets: 225 10*3/uL (ref 150–400)
RBC: 4.95 MIL/uL (ref 4.22–5.81)
RDW: 12.3 % (ref 11.5–15.5)
WBC: 9.2 10*3/uL (ref 4.0–10.5)
nRBC: 0 % (ref 0.0–0.2)

## 2020-01-29 LAB — SALICYLATE LEVEL: Salicylate Lvl: <7 mg/dL — ABNORMAL LOW (ref 7.0–30.0)

## 2020-01-29 LAB — ETHANOL: Alcohol, Ethyl (B): <10 mg/dL (ref <10)

## 2020-01-29 LAB — SARS CORONAVIRUS 2 BY RT PCR (HOSPITAL ORDER, PERFORMED IN ~~LOC~~ HOSPITAL LAB): SARS Coronavirus 2: NEGATIVE

## 2020-01-29 LAB — ACETAMINOPHEN LEVEL: Acetaminophen (Tylenol), Serum: <10 ug/mL — ABNORMAL LOW (ref 10–30)
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
## 2020-01-29 NOTE — ED Notes (Signed)
Pt belongings - white slip on shoes, gray shorts, black t-shirt with ice cube written on shirt.
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
## 2020-01-29 NOTE — ED Notes (Signed)

## 2020-01-29 NOTE — ED Provider Notes (Signed)
[facility] Emergency Department Provider Note   ____________________________________________   First MD Initiated Contact with Patient 01/29/20 0134     (approximate)  I have reviewed the triage vital signs and the nursing notes.   HISTORY  Chief Complaint Psychiatric Evaluation    HPI Randy Woods. is a 39 y.o. male brought to the ED by [employer] with paranoid behavior.  Patient was found hidden in a back room of a convenience store where he called the police stating he was hiding from people wanting to kill him.  States he argued with his girlfriend yesterday, think she is a prostitute and wanting to go "incognito".  Complains of right clavicle pain from a fall 1 month ago for which he has not been seen.  Denies active SI/HI/AH/VH.       Past Medical History:  Diagnosis Date  . Anxiety   . Bipolar 1 disorder (HCC)   . Bronchitis   . Cocaine abuse (HCC)   . Depression   . Drug abuse (HCC)   . Heroin abuse (HCC)   . Schizophrenia [facility])     Patient Active Problem List   Diagnosis Date Noted  . Opiate abuse, episodic (HCC) 02/07/2017  . Substance induced mood disorder (HCC) 02/07/2017  . Opiate overdose (HCC) 11/12/2016  . Bipolar 1 disorder, mixed, moderate (HCC) 05/07/2016  . Staphylococcal sepsis (HCC) 05/06/2016  . Severe recurrent major depression without psychotic features (HCC) 05/22/2015  . Opiate abuse, continuous (HCC) 05/22/2015  . Cocaine abuse (HCC) 05/22/2015    Past Surgical History:  Procedure Laterality Date  . NOSE SURGERY    . TEE WITHOUT CARDIOVERSION N/A 05/10/2016   Procedure: TRANSESOPHAGEAL ECHOCARDIOGRAM (TEE);  Surgeon: [name], MD;  Location: [facility] ORS;  Service: Cardiovascular;  Laterality: N/A;  . TYMPANOPLASTY      Prior to Admission medications   Medication Sig Start Date End Date Taking? Authorizing Provider  amphetamine-dextroamphetamine (ADDERALL) 10 MG tablet Take 10 mg by mouth 2 (two) times  daily. 01/21/17   [provider]  buprenorphine-naloxone (SUBOXONE) 8-2 MG SUBL SL tablet Place 1 tablet under the tongue 2 (two) times daily.    [provider]  cephALEXin (KEFLEX) 500 MG capsule Take 1 capsule (500 mg total) by mouth 4 (four) times daily. 04/22/19   Loleta Rose, MD  clonazePAM (KLONOPIN) 1 MG tablet Take 1 mg by mouth 2 (two) times daily as needed. for anxiety 01/07/17   [provider]  cyclobenzaprine (FLEXERIL) 5 MG tablet Take 1-2 tablets 3 times daily as needed 07/27/18   Enid Derry, PA-C  hydrOXYzine (ATARAX/VISTARIL) 50 MG tablet Take 1 tablet by mouth at bedtime. 01/28/17   [provider]  ibuprofen (ADVIL,MOTRIN) 600 MG tablet Take 1 tablet (600 mg total) by mouth every 6 (six) hours as needed. 07/27/18   Enid Derry, PA-C  ibuprofen (MOTRIN IB) 200 MG tablet Take 2 tablets (400 mg total) by mouth every 6 (six) hours as needed for headache or moderate pain. 10/24/19 10/23/20  Darci Current, MD  mupirocin ointment Idelle Jo) 2 % Apply to affected area 3 times daily 10/23/19 10/22/20  Emily Filbert, MD  naloxone [facility]) nasal spray 4 mg/0.1 mL Keep accessible in case of overdose. 01/24/19   Sharman Cheek, MD  traMADol (ULTRAM) 50 MG tablet Take 1 tablet (50 mg total) by mouth every 6 (six) hours as needed. 01/27/18   Minna Antis, MD  traZODone (DESYREL) 50 MG tablet Take 50 mg  by mouth at bedtime as needed. for sleep 01/07/17   [provider]    Allergies Bactrim [sulfamethoxazole-trimethoprim] and Sulfa antibiotics  Family History  Problem Relation Age of Onset  . Diabetes Mother   . Hypertension Father   . Kidney failure Father   . Diabetes Father     Social History Social History   Tobacco Use  . Smoking status: Current Every Day Smoker    Packs/day: 1.50    Types: Cigarettes  . Smokeless tobacco: Current User    Types: Snuff  Substance Use Topics  . Alcohol use: Yes  . Drug use: Yes      Types: IV, Marijuana    Comment: heroine    Review of Systems  Constitutional: No fever/chills Eyes: No visual changes. ENT: No sore throat. Cardiovascular: Denies chest pain. Respiratory: Denies shortness of breath. Gastrointestinal: No abdominal pain.  No nausea, no vomiting.  No diarrhea.  No constipation. Genitourinary: Negative for dysuria. Musculoskeletal: Positive for right clavicular pain.  Negative for back pain. Skin: Negative for rash. Neurological: Negative for headaches, focal weakness or numbness. Psychiatric:  Positive for paranoia.  ____________________________________________   PHYSICAL EXAM:  VITAL SIGNS: ED Triage Vitals  Enc Vitals Group     BP 01/29/20 0106 (!) 140/94     Pulse Rate 01/29/20 0106 (!) 114     Resp 01/29/20 0106 18     Temp 01/29/20 0106 99.1 F (37.3 C)     Temp Source 01/29/20 0106 Oral     SpO2 01/29/20 0106 97 %     Weight 01/29/20 0105 280 lb (127 kg)     Height 01/29/20 0105 5\' 8"  (1.727 m)     Head Circumference --      Peak Flow --      Pain Score 01/29/20 0105 0     Pain Loc --      Pain Edu? --      Excl. in [place]? --     Constitutional: Alert and oriented. Well appearing and in no acute distress. Eyes: Conjunctivae are normal. PERRL. EOMI. Head: Atraumatic. Nose: No congestion/rhinnorhea. Mouth/Throat: Mucous membranes are moist.  Oropharynx non-erythematous. Neck: No stridor.   Cardiovascular: Normal rate, regular rhythm. Grossly normal heart sounds.  Good peripheral circulation. Respiratory: Normal respiratory effort.  No retractions. Lungs CTAB. Gastrointestinal: Soft and nontender. No distention. No abdominal bruits. No CVA tenderness. Musculoskeletal: Deformity to right clavicle.  No pain on palpation.  No lower extremity tenderness nor edema.  No joint effusions. Neurologic:  Normal speech and language. No gross focal neurologic deficits are appreciated. No gait instability. Skin:  Skin is warm, dry and  intact. No rash noted. Psychiatric: Mood and affect are normal. Speech and behavior are paranoid and tangential.  ____________________________________________   LABS (all labs ordered are listed, but only abnormal results are displayed)  Labs Reviewed  CBC  COMPREHENSIVE METABOLIC PANEL  ETHANOL  SALICYLATE LEVEL  ACETAMINOPHEN LEVEL  URINE DRUG SCREEN, QUALITATIVE ([place])   ____________________________________________  EKG  None ____________________________________________  RADIOLOGY  ED MD interpretation:  None  Official radiology report(s): No results found.  ____________________________________________   PROCEDURES  Procedure(s) performed (including Critical Care):  Procedures   ____________________________________________   INITIAL IMPRESSION / ASSESSMENT AND PLAN / ED COURSE  As part of my medical decision making, I reviewed the following data within the [facility] notes reviewed and incorporated, Labs reviewed, Old chart reviewed, A consult was requested and obtained from this/these consultant(s)  Psychiatry and Notes from prior ED visits     Randy L Joni Norrod. was evaluated in Emergency Department on 01/29/2020 for the symptoms described in the history of present illness. He was evaluated in the context of the global COVID-19 pandemic, which necessitated consideration that the patient might be at risk for infection with the SARS-CoV-2 virus that causes COVID-19. Institutional protocols and algorithms that pertain to the evaluation of patients at risk for COVID-19 are in a state of rapid change based on information released by regulatory bodies including the CDC and federal and state organizations. These policies and algorithms were followed during the patient's care in the ED.    39 year old male with a history of bipolar disorder, schizophrenia and substance abuse found hidden in a room at a convenience store with paranoid  behavior.  He is calm and cooperative; contracts for safety while in the emergency department.  Will maintain voluntary status pending medical and psychiatric evaluation.   Clinical Course as of Jan 29 516  Tue Jan 29, 2020  0215 The patient has been placed in psychiatric observation due to the need to provide a safe environment for the patient while obtaining psychiatric consultation and evaluation, as well as ongoing medical and medication management to treat the patient's condition. The patient has not been placed under full IVC at this time.     [JS]    Clinical Course User Index [JS] Irean Hong, MD     ____________________________________________   FINAL CLINICAL IMPRESSION(S) / ED DIAGNOSES  Final diagnoses:  Bipolar affective disorder, currently depressed, moderate (HCC)  Schizophrenia, unspecified type [facility])     ED Discharge Orders    None       Note:  This document was prepared using Dragon voice recognition software and may include unintentional dictation errors.   Irean Hong, MD 01/29/20 [id number]

## 2020-01-29 NOTE — ED Notes (Signed)
Pt. To [facility] from ED ambulatory without difficulty, to room  [facility]1. Report from [facility]. Pt. Is alert and oriented, warm and dry in no distress. Pt. Denies SI, HI, and AVH. Pt. Calm and cooperative. Pt. Made aware of security cameras and Q15 minute rounds. Pt. Encouraged to let Nursing staff know of any concerns or needs.

## 2020-01-29 NOTE — ED Triage Notes (Signed)
Pt presents to ED in custody with [employer] with paranoid behavior. Pt was located hidden in a back room in a [employer] station where he then called the police so they could help him. Pt states he was hiding from people wanting to kill him. Hx of bipolar disorder. Denies SI or HI. Denies auditory or visual hallucinations.

## 2020-01-29 NOTE — Consult Note (Signed)
[place] Psychiatry Consult   Reason for Consult: Psychiatric Evaluation Referring Physician: Dr. Beather Arbour Patient Identification: Randy Woods. MRN:  [id number] Principal Diagnosis: <principal problem not specified> Diagnosis:  Active Problems:   Severe recurrent major depression without psychotic features (HCC)   Opiate abuse, continuous (HCC)   Cocaine abuse (HCC)   Staphylococcal sepsis (HCC)   Bipolar 1 disorder, mixed, moderate (HCC)   Opiate overdose (HCC)   Opiate abuse, episodic (HCC)   Substance induced mood disorder ([place])   Total Time spent with patient: 20 minutes  Subjective: "I have been with my girl for 10 years and I found out she has been prostituting." Randy Woods. is a 39 y.o. male patient presented to [facility] ED voluntarily. Per the triage nurse note, the patient presents to the ED in law enforcement custody due to him presenting with paranoid behavior.  The patient was located hiding in the back room, of a [employer] station where he called the police to help him.  The patient voiced he was hiding from people wanting to kill him.  The patient's UDS shows him positive for amphetamines, opioids, barbiturates, benzodiazepine, and methadone.  The patient voiced that these medications are prescribed to him.  The patient also disclosed that he takes Suboxone which he has not had, and he could begin going into withdrawals. The patient was seen face-to-face by this provider; chart reviewed and consulted with Dr. Beather Arbour on 01/29/2020 due to the patient's care. It was discussed with the EDP that the patient would remain under observation overnight and reassess in the a.m. due to him being positive for many substances; therefore, it is difficult to get him to verbalize what is real and what is not.  The patient presents to be paranoid by stating he was hiding from people wanting to kill him. On evaluation, the patient is alert and oriented x 2-3, anxious but cooperative, and  mood-congruent with affect. The patient does appear to be responding to internal and external stimuli. He is presenting with some delusional thinking. The patient denies auditory or visual hallucinations. The patient denies any suicidal, homicidal, or self-harm ideations. The patient is presenting with psychotic and paranoid behaviors. During an encounter with the patient, he was unable to answer the assessment questions appropriately.   Plan: The patient will remain under observation overnight and reassess in the a.m. due to him being positive for many substances; therefore, it is difficult to get him to verbalize what is real and what is not.  HPI: Per Dr. Beather Arbour: Randy Woods. is a 39 y.o. male brought to the ED by [employer] with paranoid behavior.  Patient was found hidden in a back room of a convenience store where he called the police stating he was hiding from people wanting to kill him.  States he argued with his girlfriend yesterday, think she is a prostitute and wanting to go "incognito".  Complains of right clavicle pain from a fall 1 month ago for which he has not been seen.  Denies active SI/HI/AH/VH.  Past Psychiatric History:  Anxiety Bipolar 1 disorder ([place]) Cocaine abuse ([place]) Depression Drug abuse ([place]) Heroin abuse ([place]) Schizophrenia ([place])  Risk to Self: Suicidal Ideation: No Suicidal Intent: No Is patient at risk for suicide?: No Suicidal Plan?: No Access to Means: No What has been your use of drugs/alcohol within the last 12 months?: Heroin, Benzodiazapines, Suboxone How many times?: 0 Other Self Harm Risks: None Triggers for Past Attempts: None known Intentional  Self Injurious Behavior: None Risk to Others: Homicidal Ideation: No Thoughts of Harm to Others: No Current Homicidal Intent: No Current Homicidal Plan: No Access to Homicidal Means: No Identified Victim: None History of harm to others?: No Assessment of Violence: None Noted Violent Behavior Description:  None Does patient have access to weapons?: No Criminal Charges Pending?: No Does patient have a court date: No Prior Inpatient Therapy: Prior Inpatient Therapy: (Unknown) Prior Outpatient Therapy: Prior Outpatient Therapy: (Unknown)  Past Medical History:  Past Medical History:  Diagnosis Date  . Anxiety   . Bipolar 1 disorder ([place])   . Bronchitis   . Cocaine abuse ([place])   . Depression   . Drug abuse ([place])   . Heroin abuse ([place])   . Schizophrenia [facility])     Past Surgical History:  Procedure Laterality Date  . NOSE SURGERY    . TEE WITHOUT CARDIOVERSION N/A 05/10/2016   Procedure: TRANSESOPHAGEAL ECHOCARDIOGRAM (TEE);  Surgeon: Dionisio David, MD;  Location: [facility] ORS;  Service: Cardiovascular;  Laterality: N/A;  . TYMPANOPLASTY     Family History:  Family History  Problem Relation Age of Onset  . Diabetes Mother   . Hypertension Father   . Kidney failure Father   . Diabetes Father    Family Psychiatric  History:  Social History:  Social History   Substance and Sexual Activity  Alcohol Use Yes     Social History   Substance and Sexual Activity  Drug Use Yes  . Types: IV, Marijuana   Comment: heroine    Social History   Socioeconomic History  . Marital status: Widowed    Spouse name: Not on file  . Number of children: Not on file  . Years of education: Not on file  . Highest education level: Not on file  Occupational History  . Not on file  Tobacco Use  . Smoking status: Current Every Day Smoker    Packs/day: 1.50    Types: Cigarettes  . Smokeless tobacco: Current User    Types: Snuff  Substance and Sexual Activity  . Alcohol use: Yes  . Drug use: Yes    Types: IV, Marijuana    Comment: heroine  . Sexual activity: Not on file  Other Topics Concern  . Not on file  Social History Narrative  . Not on file   Social Determinants of Health   Financial Resource Strain:   . Difficulty of Paying Living Expenses:   Food Insecurity:   . Worried About  Charity fundraiser in the Last Year:   . Arboriculturist in the Last Year:   Transportation Needs:   . Film/video editor (Medical):   Marland Kitchen Lack of Transportation (Non-Medical):   Physical Activity:   . Days of Exercise per Week:   . Minutes of Exercise per Session:   Stress:   . Feeling of Stress :   Social Connections:   . Frequency of Communication with Friends and Family:   . Frequency of Social Gatherings with Friends and Family:   . Attends Religious Services:   . Active Member of Clubs or Organizations:   . Attends Archivist Meetings:   Marland Kitchen Marital Status:    Additional Social History:    Allergies:   Allergies  Allergen Reactions  . Bactrim [Sulfamethoxazole-Trimethoprim]   . Sulfa Antibiotics Other (See Comments)    Pt states "the skin peels off of my penis"    Labs:  Results for orders placed or  performed during the hospital encounter of 01/29/20 (from the past 48 hour(s))  Comprehensive metabolic panel     Status: Abnormal   Collection Time: 01/29/20  1:19 AM  Result Value Ref Range   Sodium 138 135 - 145 mmol/L   Potassium 3.9 3.5 - 5.1 mmol/L   Chloride 102 98 - 111 mmol/L   CO2 26 22 - 32 mmol/L   Glucose, Bld 117 (H) 70 - 99 mg/dL    Comment: Glucose reference range applies only to samples taken after fasting for at least 8 hours.   BUN 14 6 - 20 mg/dL   Creatinine, Ser 0.82 0.61 - 1.24 mg/dL   Calcium 9.0 8.9 - 10.3 mg/dL   Total Protein 8.2 (H) 6.5 - 8.1 g/dL   Albumin 4.2 3.5 - 5.0 g/dL   AST 38 15 - 41 U/L   ALT 51 (H) 0 - 44 U/L   Alkaline Phosphatase 64 38 - 126 U/L   Total Bilirubin 1.0 0.3 - 1.2 mg/dL   GFR calc non Af Amer >60 >60 mL/min   GFR calc Af Amer >60 >60 mL/min   Anion gap 10 5 - 15    Comment: Performed at [facility], [address] [place], [place] [address]  Ethanol     Status: None   Collection Time: 01/29/20  1:19 AM  Result Value Ref Range   Alcohol, Ethyl (B) <10 <10 mg/dL    Comment:  (NOTE) Lowest detectable limit for serum alcohol is 10 mg/dL. For medical purposes only. Performed at [facility], [address] [place], [place] [address]   Salicylate level     Status: Abnormal   Collection Time: 01/29/20  1:19 AM  Result Value Ref Range   Salicylate Lvl <9.7 (L) 7.0 - 30.0 mg/dL    Comment: Performed at [facility], [place], [place] [address]  Acetaminophen level     Status: Abnormal   Collection Time: 01/29/20  1:19 AM  Result Value Ref Range   Acetaminophen (Tylenol), Serum <10 (L) 10 - 30 ug/mL    Comment: (NOTE) Therapeutic concentrations vary significantly. A range of 10-30 ug/mL  may be an effective concentration for many patients. However, some  are best treated at concentrations outside of this range. Acetaminophen concentrations >150 ug/mL at 4 hours after ingestion  and >50 ug/mL at 12 hours after ingestion are often associated with  toxic reactions. Performed at [facility], [address] [place], [place] [address]   cbc     Status: None   Collection Time: 01/29/20  1:19 AM  Result Value Ref Range   WBC 9.2 4.0 - 10.5 K/uL   RBC 4.95 4.22 - 5.81 MIL/uL   Hemoglobin 14.4 13.0 - 17.0 g/dL   HCT 41.3 39.0 - 52.0 %   MCV 83.4 80.0 - 100.0 fL   MCH 29.1 26.0 - 34.0 pg   MCHC 34.9 30.0 - 36.0 g/dL   RDW 12.3 11.5 - 15.5 %   Platelets 225 150 - 400 K/uL   nRBC 0.0 0.0 - 0.2 %    Comment: Performed at [facility], [address], [place],  [address]  Urine Drug Screen, Qualitative     Status: Abnormal   Collection Time: 01/29/20  1:19 AM  Result Value Ref Range   Tricyclic, Ur Screen NONE DETECTED NONE DETECTED   Amphetamines, Ur Screen POSITIVE (A) NONE DETECTED   MDMA (Ecstasy)Ur Screen NONE DETECTED NONE DETECTED  Cocaine Metabolite,Ur [place] NONE DETECTED NONE DETECTED   Opiate, Ur Screen POSITIVE (A) NONE DETECTED   Phencyclidine (PCP) Ur S NONE DETECTED NONE DETECTED    Cannabinoid 50 Ng, Ur [place] POSITIVE (A) NONE DETECTED   Barbiturates, Ur Screen NONE DETECTED NONE DETECTED   Benzodiazepine, Ur Scrn POSITIVE (A) NONE DETECTED   Methadone Scn, Ur POSITIVE (A) NONE DETECTED    Comment: (NOTE) Tricyclics + metabolites, urine    Cutoff 1000 ng/mL Amphetamines + metabolites, urine  Cutoff 1000 ng/mL MDMA (Ecstasy), urine              Cutoff 500 ng/mL Cocaine Metabolite, urine          Cutoff 300 ng/mL Opiate + metabolites, urine        Cutoff 300 ng/mL Phencyclidine (PCP), urine         Cutoff 25 ng/mL Cannabinoid, urine                 Cutoff 50 ng/mL Barbiturates + metabolites, urine  Cutoff 200 ng/mL Benzodiazepine, urine              Cutoff 200 ng/mL Methadone, urine                   Cutoff 300 ng/mL The urine drug screen provides only a preliminary, unconfirmed analytical test result and should not be used for non-medical purposes. Clinical consideration and professional judgment should be applied to any positive drug screen result due to possible interfering substances. A more specific alternate chemical method must be used in order to obtain a confirmed analytical result. Gas chromatography / mass spectrometry (GC/MS) is the preferred confirmat ory method. Performed at [facility], [address] [place], [place] [address]   SARS Coronavirus 2 by RT PCR (hospital order, performed in [facility] hospital lab) Nasopharyngeal Nasopharyngeal Swab     Status: None   Collection Time: 01/29/20  2:33 AM   Specimen: Nasopharyngeal Swab  Result Value Ref Range   SARS Coronavirus 2 NEGATIVE NEGATIVE    Comment: (NOTE) SARS-CoV-2 target nucleic acids are NOT DETECTED. The SARS-CoV-2 RNA is generally detectable in upper and lower respiratory specimens during the acute phase of infection. The lowest concentration of SARS-CoV-2 viral copies this assay can detect is 250 copies / mL. A negative result does not preclude SARS-CoV-2 infection and  should not be used as the sole basis for treatment or other patient management decisions.  A negative result may occur with improper specimen collection / handling, submission of specimen other than nasopharyngeal swab, presence of viral mutation(s) within the areas targeted by this assay, and inadequate number of viral copies (<250 copies / mL). A negative result must be combined with clinical observations, patient history, and epidemiological information. Fact Sheet for Patients:   StrictlyIdeas.no Fact Sheet for Healthcare Providers: BankingDealers.co.za This test is not yet approved or cleared  by the [place] FDA and has been authorized for detection and/or diagnosis of SARS-CoV-2 by FDA under an Emergency Use Authorization (EUA).  This EUA will remain in effect (meaning this test can be used) for the duration of the COVID-19 declaration under Section 564(b)(1) of the Act, 21 U.S.C. section 360bbb-3(b)(1), unless the authorization is terminated or revoked sooner. Performed at [facility], [address] [place], [place] [address]     No current facility-administered medications for this encounter.   Current Outpatient Medications  Medication Sig Dispense Refill  . amphetamine-dextroamphetamine (ADDERALL) 10 MG tablet Take 10  mg by mouth 2 (two) times daily.  0  . buprenorphine-naloxone (SUBOXONE) 8-2 MG SUBL SL tablet Place 1 tablet under the tongue 2 (two) times daily.    . cephALEXin (KEFLEX) 500 MG capsule Take 1 capsule (500 mg total) by mouth 4 (four) times daily. 40 capsule 0  . clonazePAM (KLONOPIN) 1 MG tablet Take 1 mg by mouth 2 (two) times daily as needed. for anxiety  0  . cyclobenzaprine (FLEXERIL) 5 MG tablet Take 1-2 tablets 3 times daily as needed 20 tablet 0  . hydrOXYzine (ATARAX/VISTARIL) 50 MG tablet Take 1 tablet by mouth at bedtime.  1  . ibuprofen (ADVIL,MOTRIN) 600 MG tablet Take 1 tablet (600  mg total) by mouth every 6 (six) hours as needed. 30 tablet 0  . ibuprofen (MOTRIN IB) 200 MG tablet Take 2 tablets (400 mg total) by mouth every 6 (six) hours as needed for headache or moderate pain. 100 tablet 2  . mupirocin ointment (BACTROBAN) 2 % Apply to affected area 3 times daily 22 g 0  . naloxone (NARCAN) nasal spray 4 mg/0.1 mL Keep accessible in case of overdose. 1 kit 0  . traMADol (ULTRAM) 50 MG tablet Take 1 tablet (50 mg total) by mouth every 6 (six) hours as needed. 10 tablet 0  . traZODone (DESYREL) 50 MG tablet Take 50 mg by mouth at bedtime as needed. for sleep  0    Musculoskeletal: Strength & Muscle Tone: within normal limits Gait & Station: normal Patient leans: N/A  Psychiatric Specialty Exam: Physical Exam  Nursing note and vitals reviewed. Constitutional: He is oriented to person, place, and time.  Respiratory: Effort normal.  Musculoskeletal:        General: Normal range of motion.     Cervical back: Normal range of motion and neck supple.  Neurological: He is alert and oriented to person, place, and time.    Review of Systems  Psychiatric/Behavioral: Positive for agitation, behavioral problems, confusion and sleep disturbance. The patient is nervous/anxious.   All other systems reviewed and are negative.   Blood pressure (!) 145/90, pulse (!) 104, temperature 99.1 F (37.3 C), temperature source Oral, resp. rate 20, height _0  (1.727 m), weight 127 kg, SpO2 99 %.Body mass index is 42.57 kg/m.  General Appearance: Fairly Groomed  Eye Contact:  Fair  Speech:  Garbled, Slow and Slurred  Volume:  Decreased  Mood:  Anxious, Depressed and Euphoric  Affect:  Congruent, Depressed and Inappropriate  Thought Process:  Disorganized  Orientation:  Other:  x2  Thought Content:  Illogical, Paranoid Ideation and Rumination  Suicidal Thoughts:  No  Homicidal Thoughts:  No  Memory:  Immediate;   Fair Recent;   Fair Remote;   Fair  Judgement:  Impaired   Insight:  Lacking  Psychomotor Activity:  Increased  Concentration:  Concentration: Poor and Attention Span: Poor  Recall:  Poor  Fund of Knowledge:  Poor  Language:  Poor  Akathisia:  Negative  Handed:  Right  AIMS (if indicated):     Assets:  Communication Skills Desire for Improvement Leisure Time Physical Health Resilience Social Support  ADL's:  Intact  Cognition:  Impaired,  Mild  Sleep:    Insomnia     Treatment Plan Summary: Daily contact with patient to assess and evaluate symptoms and progress in treatment and Plan The patient will remain under observation overnight and reassess due to him being positive for multiple substances.  Disposition: Supportive therapy provided about ongoing stressors.  The patient will remain under observation overnight reassess in the a.m. due to him being positive for multiple substances.  Caroline Sauger, NP 01/29/2020 6:10 AM

## 2020-01-29 NOTE — ED Notes (Signed)
VOL/Psych Consult ordered/Moved to BHU-1 @0250 

## 2020-01-29 NOTE — BH Assessment (Signed)
Late Entry- Writer spoke with the patient to complete an updated/reassessment. Patient denies SI/HI and AV/H.  Patient does not meet inpatient criteria. 

## 2020-07-11 ENCOUNTER — Other Ambulatory Visit: Payer: Self-pay

## 2020-07-11 ENCOUNTER — Emergency Department
Admission: EM | Admit: 2020-07-11 | Discharge: 2020-07-11 | Disposition: A | Discharge status: Home / Self Care | Payer: Medicaid Other | Attending: Emergency Medicine | Admitting: Emergency Medicine

## 2020-07-11 ENCOUNTER — Emergency Department: Payer: Medicaid Other

## 2020-07-11 ENCOUNTER — Encounter: Payer: Self-pay | Admitting: Emergency Medicine

## 2020-07-11 DIAGNOSIS — L03211 Cellulitis of face: Secondary | ICD-10-CM

## 2020-07-11 DIAGNOSIS — S91312A Laceration without foreign body, left foot, initial encounter: Secondary | ICD-10-CM | POA: Diagnosis not present

## 2020-07-11 DIAGNOSIS — Z79899 Other long term (current) drug therapy: Secondary | ICD-10-CM | POA: Insufficient documentation

## 2020-07-11 DIAGNOSIS — F17210 Nicotine dependence, cigarettes, uncomplicated: Secondary | ICD-10-CM | POA: Insufficient documentation

## 2020-07-11 DIAGNOSIS — W260XXA Contact with knife, initial encounter: Secondary | ICD-10-CM | POA: Diagnosis not present

## 2020-07-11 DIAGNOSIS — F1911 Other psychoactive substance abuse, in remission: Secondary | ICD-10-CM

## 2020-07-11 DIAGNOSIS — D72828 Other elevated white blood cell count: Secondary | ICD-10-CM

## 2020-07-11 DIAGNOSIS — S99922A Unspecified injury of left foot, initial encounter: Secondary | ICD-10-CM | POA: Diagnosis present

## 2020-07-11 MED ORDER — BACITRACIN ZINC 500 UNIT/GM EX OINT
TOPICAL_OINTMENT | Freq: Once | CUTANEOUS | Status: AC
  Administered 2020-07-11: 1 via TOPICAL
  Filled 2020-07-11: qty 0.9

## 2020-07-11 MED ORDER — ACETAMINOPHEN 500 MG PO TABS
1000.0000 mg | ORAL_TABLET | Freq: Once | ORAL | Status: AC
  Administered 2020-07-11: 1000 mg via ORAL
  Filled 2020-07-11: qty 2

## 2020-07-11 NOTE — Discharge Instructions (Signed)
In general, keep the area clean and dry. Then apply a Bacitracin/Neosporin type of antibiotic ointment.  Gently wash with warm soap and water once daily, and again if it gets dirty. Don't vigorously scrub at the wound.  Gently pat dry. Once dry, apply Neosporin / bacitracin type antibiotic ointment. It is important to keep the wound moist with an antibiotic ointment, or a small amount of Vaseline, for the first 5 days. Keeping it moist/clean will help the area heal faster and prevent scar tissue formation.  If you are doing anything active, keep it covered.  If you are sitting around at home, you may leave it uncovered.  Please take Tylenol and ibuprofen/Advil for your pain.  It is safe to take them together, or to alternate them every few hours.  Take up to 1000mg  of Tylenol at a time, up to 4 times per day.  Do not take more than 4000 mg of Tylenol in 24 hours.  For ibuprofen, take 400-600 mg, 4-5 times per day.  As we discussed, you will likely have bruising and swelling to your left foot for a couple weeks.  This is just blood settling with gravity, and her body will reabsorb it with time.  Using compression and elevation will be helpful.  Return to the ED with any fevers or pus coming from his wound.
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
## 2020-07-21 ENCOUNTER — Emergency Department: Payer: Medicaid Other

## 2020-07-21 ENCOUNTER — Other Ambulatory Visit: Payer: Self-pay

## 2020-07-21 ENCOUNTER — Observation Stay
Admission: EM | Admit: 2020-07-21 | Discharge: 2020-07-23 | Discharge status: Against Medical Advice | Payer: Medicaid Other | Attending: Internal Medicine | Admitting: Internal Medicine

## 2020-07-21 DIAGNOSIS — F1920 Other psychoactive substance dependence, uncomplicated: Secondary | ICD-10-CM | POA: Insufficient documentation

## 2020-07-21 DIAGNOSIS — L03116 Cellulitis of left lower limb: Secondary | ICD-10-CM

## 2020-07-21 DIAGNOSIS — F319 Bipolar disorder, unspecified: Secondary | ICD-10-CM | POA: Insufficient documentation

## 2020-07-21 DIAGNOSIS — D72829 Elevated white blood cell count, unspecified: Secondary | ICD-10-CM | POA: Diagnosis not present

## 2020-07-21 DIAGNOSIS — R600 Localized edema: Secondary | ICD-10-CM | POA: Diagnosis present

## 2020-07-21 DIAGNOSIS — F17210 Nicotine dependence, cigarettes, uncomplicated: Secondary | ICD-10-CM | POA: Diagnosis not present

## 2020-07-21 DIAGNOSIS — Z79899 Other long term (current) drug therapy: Secondary | ICD-10-CM | POA: Insufficient documentation

## 2020-07-21 DIAGNOSIS — L03211 Cellulitis of face: Secondary | ICD-10-CM | POA: Diagnosis not present

## 2020-07-21 DIAGNOSIS — Z20822 Contact with and (suspected) exposure to covid-19: Secondary | ICD-10-CM | POA: Diagnosis not present

## 2020-07-21 LAB — CBC WITH DIFFERENTIAL/PLATELET
Abs Immature Granulocytes: 0.05 10*3/uL (ref 0.00–0.07)
Basophils Absolute: 0.1 10*3/uL (ref 0.0–0.1)
Basophils Relative: 0 %
Eosinophils Absolute: 0.2 10*3/uL (ref 0.0–0.5)
Eosinophils Relative: 2 %
HCT: 39.7 % (ref 39.0–52.0)
Hemoglobin: 13.5 g/dL (ref 13.0–17.0)
Immature Granulocytes: 0 %
Lymphocytes Relative: 15 %
Lymphs Abs: 1.9 10*3/uL (ref 0.7–4.0)
MCH: 29.9 pg (ref 26.0–34.0)
MCHC: 34 g/dL (ref 30.0–36.0)
MCV: 88 fL (ref 80.0–100.0)
Monocytes Absolute: 0.7 10*3/uL (ref 0.1–1.0)
Monocytes Relative: 6 %
Neutro Abs: 9.2 10*3/uL — ABNORMAL HIGH (ref 1.7–7.7)
Neutrophils Relative %: 77 %
Platelets: 165 10*3/uL (ref 150–400)
RBC: 4.51 MIL/uL (ref 4.22–5.81)
RDW: 12 % (ref 11.5–15.5)
WBC: 12.1 10*3/uL — ABNORMAL HIGH (ref 4.0–10.5)
nRBC: 0 % (ref 0.0–0.2)

## 2020-07-21 LAB — COMPREHENSIVE METABOLIC PANEL
ALT: 27 U/L (ref 0–44)
AST: 25 U/L (ref 15–41)
Albumin: 4.1 g/dL (ref 3.5–5.0)
Alkaline Phosphatase: 71 U/L (ref 38–126)
Anion gap: 13 (ref 5–15)
BUN: 13 mg/dL (ref 6–20)
CO2: 26 mmol/L (ref 22–32)
Calcium: 8.6 mg/dL — ABNORMAL LOW (ref 8.9–10.3)
Chloride: 98 mmol/L (ref 98–111)
Creatinine, Ser: 0.68 mg/dL (ref 0.61–1.24)
GFR, Estimated: >60 mL/min (ref >60)
Glucose, Bld: 134 mg/dL — ABNORMAL HIGH (ref 70–99)
Potassium: 3.8 mmol/L (ref 3.5–5.1)
Sodium: 137 mmol/L (ref 135–145)
Total Bilirubin: 0.6 mg/dL (ref 0.3–1.2)
Total Protein: 7.9 g/dL (ref 6.5–8.1)

## 2020-07-21 LAB — LACTIC ACID, PLASMA
Lactic Acid, Venous: 2.5 mmol/L (ref 0.5–1.9)
Lactic Acid, Venous: 1.2 mmol/L (ref 0.5–1.9)

## 2020-07-21 MED ORDER — VANCOMYCIN HCL 2000 MG/400ML IV SOLN
2000.0000 mg | Freq: Once | INTRAVENOUS | Status: AC
  Administered 2020-07-22: 2000 mg via INTRAVENOUS
  Filled 2020-07-21: qty 400

## 2020-07-21 MED ORDER — IBUPROFEN 400 MG PO TABS
400.0000 mg | ORAL_TABLET | Freq: Four times a day (QID) | ORAL | Status: DC | PRN
  Filled 2020-07-21: qty 1

## 2020-07-21 MED ORDER — ACETAMINOPHEN 500 MG PO TABS: 1000.0000 mg | ORAL_TABLET | Freq: Once | ORAL | Status: AC

## 2020-07-21 MED ORDER — IOHEXOL 300 MG/ML  SOLN
75.0000 mL | Freq: Once | INTRAMUSCULAR | Status: AC | PRN
  Administered 2020-07-21: 75 mL via INTRAVENOUS

## 2020-07-21 MED ORDER — CLONAZEPAM 1 MG PO TABS
1.0000 mg | ORAL_TABLET | Freq: Every day | ORAL | Status: DC
  Administered 2020-07-21 – 2020-07-22 (×2): 1 mg via ORAL
  Filled 2020-07-21: qty 1
  Filled 2020-07-21: qty 2

## 2020-07-21 MED ORDER — AMPHETAMINE-DEXTROAMPHETAMINE 10 MG PO TABS
30.0000 mg | ORAL_TABLET | Freq: Two times a day (BID) | ORAL | Status: DC
  Administered 2020-07-22 (×2): 30 mg via ORAL
  Filled 2020-07-21 (×2): qty 3

## 2020-07-21 MED ORDER — LURASIDONE HCL 40 MG PO TABS
40.0000 mg | ORAL_TABLET | Freq: Every day | ORAL | Status: DC
  Administered 2020-07-22: 40 mg via ORAL
  Filled 2020-07-21 (×2): qty 1

## 2020-07-21 MED ORDER — METRONIDAZOLE IN NACL 5-0.79 MG/ML-% IV SOLN
500.0000 mg | Freq: Three times a day (TID) | INTRAVENOUS | Status: DC
  Administered 2020-07-21 – 2020-07-22 (×2): 500 mg via INTRAVENOUS
  Filled 2020-07-21 (×2): qty 100

## 2020-07-21 MED ORDER — NALOXONE HCL 4 MG/0.1ML NA LIQD: 0.4000 mg | Freq: Once | NASAL | Status: DC

## 2020-07-21 MED ORDER — BUPRENORPHINE HCL-NALOXONE HCL 8-2 MG SL FILM: 1.0000 | ORAL_FILM | Freq: Three times a day (TID) | SUBLINGUAL | Status: DC

## 2020-07-21 MED ORDER — ACETAMINOPHEN 500 MG PO TABS
ORAL_TABLET | ORAL | Status: AC
  Administered 2020-07-21: 1000 mg via ORAL
  Filled 2020-07-21: qty 2

## 2020-07-21 MED ORDER — SODIUM CHLORIDE 0.9 % IV SOLN
1.0000 g | Freq: Two times a day (BID) | INTRAVENOUS | Status: DC
  Administered 2020-07-21: 1 g via INTRAVENOUS
  Filled 2020-07-21: qty 10

## 2020-07-21 MED ORDER — GABAPENTIN 300 MG PO CAPS
400.0000 mg | ORAL_CAPSULE | Freq: Three times a day (TID) | ORAL | Status: DC
  Administered 2020-07-21 – 2020-07-22 (×4): 400 mg via ORAL
  Filled 2020-07-21 (×4): qty 1

## 2020-07-21 NOTE — Consult Note (Addendum)
PHARMACY -  BRIEF ANTIBIOTIC NOTE   Pharmacy has received consult(s) for Vancomycin from an ED provider.  D/w MD and no consult placed. Pharmacist received verbal to adjust vancomycin to from 1500 mg to 2000 mg x1 dose.   One time order(s) placed for Vancomycin 2000 mg x1 dose.   Thank you, Katha Cabal 07/21/2020  10:14 PM
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
## 2020-07-21 NOTE — ED Notes (Signed)
Patient at CT scan at this time.

## 2020-07-21 NOTE — ED Notes (Signed)
Lab reports lactic 2.5; acuity level changed

## 2020-07-21 NOTE — ED Notes (Signed)
CT advising to change scan to CT soft tissue neck with contrast to show the best image. Order changed.

## 2020-07-22 DIAGNOSIS — F1910 Other psychoactive substance abuse, uncomplicated: Secondary | ICD-10-CM | POA: Diagnosis not present

## 2020-07-22 DIAGNOSIS — L03211 Cellulitis of face: Secondary | ICD-10-CM | POA: Diagnosis not present

## 2020-07-22 DIAGNOSIS — F3170 Bipolar disorder, currently in remission, most recent episode unspecified: Secondary | ICD-10-CM

## 2020-07-22 DIAGNOSIS — R030 Elevated blood-pressure reading, without diagnosis of hypertension: Secondary | ICD-10-CM

## 2020-07-22 LAB — HIV ANTIBODY (ROUTINE TESTING W REFLEX): HIV Screen 4th Generation wRfx: NONREACTIVE

## 2020-07-22 LAB — CBC
HCT: 45 % (ref 39.0–52.0)
Hemoglobin: 15.5 g/dL (ref 13.0–17.0)
MCH: 30.3 pg (ref 26.0–34.0)
MCHC: 34.4 g/dL (ref 30.0–36.0)
MCV: 88.1 fL (ref 80.0–100.0)
Platelets: 177 10*3/uL (ref 150–400)
RBC: 5.11 MIL/uL (ref 4.22–5.81)
RDW: 12 % (ref 11.5–15.5)
WBC: 15.2 10*3/uL — ABNORMAL HIGH (ref 4.0–10.5)
nRBC: 0 % (ref 0.0–0.2)

## 2020-07-22 LAB — RESPIRATORY PANEL BY RT PCR (FLU A&B, COVID)
Influenza A by PCR: NEGATIVE
Influenza B by PCR: NEGATIVE
SARS Coronavirus 2 by RT PCR: NEGATIVE

## 2020-07-22 LAB — BASIC METABOLIC PANEL
Anion gap: 9 (ref 5–15)
BUN: 9 mg/dL (ref 6–20)
CO2: 26 mmol/L (ref 22–32)
Calcium: 8.5 mg/dL — ABNORMAL LOW (ref 8.9–10.3)
Chloride: 99 mmol/L (ref 98–111)
Creatinine, Ser: 0.65 mg/dL (ref 0.61–1.24)
GFR, Estimated: >60 mL/min (ref >60)
Glucose, Bld: 107 mg/dL — ABNORMAL HIGH (ref 70–99)
Potassium: 3.5 mmol/L (ref 3.5–5.1)
Sodium: 134 mmol/L — ABNORMAL LOW (ref 135–145)

## 2020-07-22 MED ORDER — VANCOMYCIN HCL IN DEXTROSE 1-5 GM/200ML-% IV SOLN: 1000.0000 mg | Freq: Once | INTRAVENOUS | Status: DC

## 2020-07-22 MED ORDER — BUPRENORPHINE HCL-NALOXONE HCL 8-2 MG SL SUBL
1.0000 | SUBLINGUAL_TABLET | Freq: Three times a day (TID) | SUBLINGUAL | Status: DC
  Administered 2020-07-22 (×4): 1 via SUBLINGUAL
  Filled 2020-07-22 (×4): qty 1

## 2020-07-22 MED ORDER — VANCOMYCIN HCL IN DEXTROSE 1-5 GM/200ML-% IV SOLN
1000.0000 mg | Freq: Three times a day (TID) | INTRAVENOUS | Status: DC
  Administered 2020-07-22 (×2): 1000 mg via INTRAVENOUS
  Filled 2020-07-22 (×4): qty 200

## 2020-07-22 MED ORDER — SODIUM CHLORIDE 0.9 % IV SOLN
3.0000 g | Freq: Four times a day (QID) | INTRAVENOUS | Status: DC
  Administered 2020-07-22: 3 g via INTRAVENOUS
  Filled 2020-07-22 (×4): qty 8

## 2020-07-22 MED ORDER — SODIUM CHLORIDE 0.9 % IV SOLN: INTRAVENOUS | Status: DC

## 2020-07-22 MED ORDER — MAGNESIUM HYDROXIDE 400 MG/5ML PO SUSP
30.0000 mL | Freq: Every day | ORAL | Status: DC | PRN
  Filled 2020-07-22: qty 30

## 2020-07-22 MED ORDER — HYDRALAZINE HCL 20 MG/ML IJ SOLN: 20.0000 mg | Freq: Three times a day (TID) | INTRAMUSCULAR | Status: DC | PRN

## 2020-07-22 MED ORDER — ACETAMINOPHEN 325 MG PO TABS: 650.0000 mg | ORAL_TABLET | Freq: Four times a day (QID) | ORAL | Status: DC | PRN

## 2020-07-22 MED ORDER — METHYLPREDNISOLONE SODIUM SUCC 40 MG IJ SOLR
40.0000 mg | Freq: Three times a day (TID) | INTRAMUSCULAR | Status: DC
  Administered 2020-07-22 (×3): 40 mg via INTRAVENOUS
  Filled 2020-07-22 (×3): qty 1

## 2020-07-22 MED ORDER — ONDANSETRON HCL 4 MG/2ML IJ SOLN: 4.0000 mg | Freq: Four times a day (QID) | INTRAMUSCULAR | Status: DC | PRN

## 2020-07-22 MED ORDER — METRONIDAZOLE 500 MG PO TABS: 500.0000 mg | ORAL_TABLET | Freq: Three times a day (TID) | ORAL | Status: DC

## 2020-07-22 MED ORDER — MORPHINE SULFATE (PF) 2 MG/ML IV SOLN: 2.0000 mg | INTRAVENOUS | Status: DC | PRN

## 2020-07-22 MED ORDER — TRAZODONE HCL 50 MG PO TABS
25.0000 mg | ORAL_TABLET | Freq: Every evening | ORAL | Status: DC | PRN
  Administered 2020-07-22: 25 mg via ORAL
  Filled 2020-07-22: qty 1

## 2020-07-22 MED ORDER — METRONIDAZOLE IN NACL 5-0.79 MG/ML-% IV SOLN: 500.0000 mg | Freq: Three times a day (TID) | INTRAVENOUS | Status: DC

## 2020-07-22 MED ORDER — ACETAMINOPHEN 650 MG RE SUPP: 650.0000 mg | Freq: Four times a day (QID) | RECTAL | Status: DC | PRN

## 2020-07-22 MED ORDER — SODIUM CHLORIDE 0.9 % IV SOLN: 2.0000 g | Freq: Once | INTRAVENOUS | Status: DC

## 2020-07-22 MED ORDER — ONDANSETRON HCL 4 MG PO TABS: 4.0000 mg | ORAL_TABLET | Freq: Four times a day (QID) | ORAL | Status: DC | PRN

## 2020-07-22 MED ORDER — SODIUM CHLORIDE 0.9 % IV SOLN
2.0000 g | Freq: Three times a day (TID) | INTRAVENOUS | Status: DC
  Administered 2020-07-22: 2 g via INTRAVENOUS
  Filled 2020-07-22 (×4): qty 2

## 2020-07-22 MED ORDER — ENOXAPARIN SODIUM 60 MG/0.6ML ~~LOC~~ SOLN
60.0000 mg | SUBCUTANEOUS | Status: DC
  Filled 2020-07-22 (×2): qty 0.6

## 2020-07-22 NOTE — H&P (Addendum)
[place]   PATIENT NAME: Randy Woods    MR#:  [id number]  DATE OF BIRTH:  Dec 31, 1980  DATE OF ADMISSION:  07/21/2020  PRIMARY CARE PHYSICIAN: [name]   REQUESTING/REFERRING PHYSICIAN: Dorothea Glassman, MD  CHIEF COMPLAINT:   Chief Complaint  Patient presents with  . Facial Swelling    HISTORY OF PRESENT ILLNESS:  Randy Woods  is a 39 y.o. Caucasian male with a known history of bipolar disorder, polysubstance abuse including heroin and cocaine and schizophrenia, who presented to the emergency room with acute onset of worsening right facial swelling with erythema, induration and tenderness for the last 3 days.  He has been having tooth ache lately.  He denies any fever or chills.  No dyspnea or cough or wheezing.  No nausea or vomiting or abdominal pain.  No dysuria, oliguria or hematuria or flank pain.  Upon presentation to the emergency room, blood pressure was 168/98 with otherwise normal vital signs.  Labs revealed lactic acid of 2.5 and later 1.2.  CBC showed leukocytosis of 12.1.  Influenza a and B antigens as well as COVID-19 PCR came back negative.  CT soft tissue neck with contrast revealed extensive soft tissue swelling with inflammatory stranding throughout the right face primary involving the right masticator space concerning for acute cellulitis and an ill-defined hypodensity measuring about 9 mm suspicious for phlegmon with no definite discrete drainable abscess.  There were few scattered dental caries about the teeth that could reflect an odontogenic source.  It showed enlarged bilateral cervical adenopathy likely reactive.  The patient was given 1 g p.o. Tylenol as well as IV Rocephin and vancomycin and Flagyl.  He had a suture removed from his left ankle by Dr. Darnelle Catalan.  He will be admitted to a medical bed for further evaluation and management. PAST MEDICAL HISTORY:   Past Medical History:  Diagnosis Date  . Anxiety   . Bipolar 1  disorder (HCC)   . Bronchitis   . Cocaine abuse (HCC)   . Depression   . Drug abuse (HCC)   . Heroin abuse (HCC)   . Schizophrenia (HCC)     PAST SURGICAL HISTORY:   Past Surgical History:  Procedure Laterality Date  . NOSE SURGERY    . TEE WITHOUT CARDIOVERSION N/A 05/10/2016   Procedure: TRANSESOPHAGEAL ECHOCARDIOGRAM (TEE);  Surgeon: [name], MD;  Location: [facility] ORS;  Service: Cardiovascular;  Laterality: N/A;  . TYMPANOPLASTY      SOCIAL HISTORY:   Social History   Tobacco Use  . Smoking status: Current Every Day Smoker    Packs/day: 1.50    Types: Cigarettes  . Smokeless tobacco: Current User    Types: Snuff  Substance Use Topics  . Alcohol use: Yes    FAMILY HISTORY:   Family History  Problem Relation Age of Onset  . Diabetes Mother   . Hypertension Father   . Kidney failure Father   . Diabetes Father     DRUG ALLERGIES:   Allergies  Allergen Reactions  . Bactrim [Sulfamethoxazole-Trimethoprim]   . Sulfa Antibiotics Other (See Comments)    Pt states "the skin peels off of my penis"    REVIEW OF SYSTEMS:   ROS As per history of present illness. All pertinent systems were reviewed above. Constitutional, HEENT, cardiovascular, respiratory, GI, GU, musculoskeletal, neuro, psychiatric, endocrine, integumentary and hematologic systems were reviewed and are otherwise negative/unremarkable except for positive findings mentioned above in the HPI.  MEDICATIONS AT HOME:   Prior to Admission medications   Medication Sig Start Date End Date Taking? Authorizing Provider  amphetamine-dextroamphetamine (ADDERALL) 30 MG tablet Take 30 mg by mouth 2 (two) times daily.    Yes [provider]  Buprenorphine HCl-Naloxone HCl (SUBOXONE) 8-2 MG FILM Place 1 Film under the tongue 3 (three) times daily.   Yes [provider]  clonazePAM (KLONOPIN) 1 MG tablet Take 2 mg by mouth at bedtime.    Yes [provider]  gabapentin (NEURONTIN)  400 MG capsule Take 400 mg by mouth 3 (three) times daily.   Yes [provider]  ibuprofen (MOTRIN IB) 200 MG tablet Take 2 tablets (400 mg total) by mouth every 6 (six) hours as needed for headache or moderate pain. 10/24/19 10/23/20 Yes Darci Current, MD  lurasidone (LATUDA) 40 MG TABS tablet Take 40 mg by mouth daily with breakfast.   Yes [provider]  naloxone (NARCAN) nasal spray 4 mg/0.1 mL Keep accessible in case of overdose. 01/24/19  Yes Sharman Cheek, MD      VITAL SIGNS:  Blood pressure 138/86, pulse 86, temperature 98.9 F (37.2 C), temperature source Oral, resp. rate 18, height 5\' 9"  (1.753 m), weight 117.9 kg, SpO2 100 %.  PHYSICAL EXAMINATION:  Physical Exam  GENERAL:  39 y.o.-year-old Caucasian male patient lying in the bed with no acute distress.  EYES: Pupils equal, round, reactive to light and accommodation. No scleral icterus. Extraocular muscles intact.  HEENT: Head atraumatic, normocephalic. Oropharynx: Moist mucous membrane with dental caries and nasopharynx clear.  NECK:  Supple, no jugular venous distention. No thyroid enlargement, no tenderness.  LUNGS: Normal breath sounds bilaterally, no wheezing, rales,rhonchi or crepitation. No use of accessory muscles of respiration.  CARDIOVASCULAR: Regular rate and rhythm, S1, S2 normal. No murmurs, rubs, or gallops.  ABDOMEN: Soft, nondistended, nontender. Bowel sounds present. No organomegaly or mass.  EXTREMITIES: No pedal edema, cyanosis, or clubbing.  Adequate healing of the left ankle wound. NEUROLOGIC: Cranial nerves II through XII are intact. Muscle strength 5/5 in all extremities. Sensation intact. Gait not checked.  PSYCHIATRIC: The patient is alert and oriented x 3.  Normal affect and good eye contact. SKIN: Right facial erythema and swelling with tenderness and induration involving the lips and extending to chin and submandibular area with tender bilateral cervical  adenopathy.    LABORATORY PANEL:   CBC Recent Labs  Lab 07/21/20 2005  WBC 12.1*  HGB 13.5  HCT 39.7  PLT 165   ------------------------------------------------------------------------------------------------------------------  Chemistries  Recent Labs  Lab 07/21/20 2005  NA 137  K 3.8  CL 98  CO2 26  GLUCOSE 134*  BUN 13  CREATININE 0.68  CALCIUM 8.6*  AST 25  ALT 27  ALKPHOS 71  BILITOT 0.6   ------------------------------------------------------------------------------------------------------------------  Cardiac Enzymes No results for input(s): TROPONINI in the last 168 hours. ------------------------------------------------------------------------------------------------------------------  RADIOLOGY:  CT Soft Tissue Neck W Contrast  Result Date: 07/21/2020 CLINICAL DATA:  Initial evaluation for right-sided facial swelling for 3 days, cellulitis. EXAM: CT NECK WITH CONTRAST TECHNIQUE: Multidetector CT imaging of the neck was performed using the standard protocol following the bolus administration of intravenous contrast. CONTRAST:  79mL OMNIPAQUE IOHEXOL 300 MG/ML  SOLN COMPARISON:  None available. FINDINGS: Pharynx and larynx: Oral cavity within normal limits. Palatine tonsils symmetric and normal. No discrete tonsillar or peritonsillar abscess. Remainder of the oropharynx and nasopharynx within normal limits. No retropharyngeal collection or swelling. Epiglottis within normal limits. Vallecula largely  clear. Remainder of the hypopharynx and supraglottic larynx within normal limits. Glottis normal. Subglottic airway clear. Salivary glands: Prominent inflammatory stranding surrounds the right parotid gland as well as the right greater than left submandibular glands related to the acute inflammatory process within the right face. Salivary glands and cells within normal limits. Thyroid: Normal. Lymph nodes: Prominent right greater than left submental nodes measure up to  12 mm. Right greater than left level IB nodes measure up to 1 cm. Mildly prominent level II nodes measure up to 12 mm. Findings presumably reactive. Vascular: Normal intravascular enhancement seen throughout the neck. Limited intracranial: Unremarkable. Visualized orbits: Unremarkable. Mastoids and visualized paranasal sinuses: Visualized paranasal sinuses are largely clear. Visualize mastoids and middle ear cavities are well pneumatized and free of fluid. Skeleton: No visible acute osseous abnormality. No discrete or worrisome osseous lesions. Upper chest: Visualized upper chest demonstrates no acute finding. Partially visualized lungs are grossly clear. Other: Extensive soft tissue swelling with inflammatory stranding seen throughout the right face, primarily involving the right masticator space, concerning for acute infection/cellulitis. Hazy inflammatory stranding extends into the right parapharyngeal space, as well as the submental region and across the midline to the left submandibular space. No frank invasion into the floor of mouth at this time. Associated stranding within the right retro antral fat. There are a few scattered dental caries about the remaining teeth, which could reflect an odontogenic source. Somewhat ill-defined hypodensity within the right masticator space measuring approximately 9 mm suspicious for phlegmon (series 2, image 41). No frank discrete or drainable abscess identified. No definite abscess about the teeth, although evaluation somewhat limited by streak artifact from dental amalgam. IMPRESSION: 1. Extensive soft tissue swelling with inflammatory stranding throughout the right face, primarily involving the right masticator space, concerning for acute infection/cellulitis. Somewhat ill-defined hypodensity within the right masticator space measuring approximately 9 mm suspicious for phlegmon, although no definite discrete or drainable abscess identified at this time. Few scattered  dental caries about the teeth, which could reflect an odontogenic source. 2. Enlarged bilateral cervical adenopathy, likely reactive. Electronically Signed   By: Rise MuBenjamin  McClintock M.D.   On: 07/21/2020 23:09      IMPRESSION AND PLAN:   1.  Right facial severe nonpurulent cellulitis. -The patient will be admitted to a medical bed. -We will continue antibiotic therapy with IV cefepime and vancomycin and steroid therapy with IV Solu-Medrol. -Pain management will be provided and warm compresses. -I discussed with Dr. Jenne CampusMcQueen with ENT who recommended IV antibiotics and steroids.  2.  Elevated blood pressure. -This is likely secondary to pain. -Has been getting better with pain control.  3.  Bipolar disorder. -We will continue Latuda and Klonopin.  4.  Polysubstance abuse. -We will continue Suboxone and as needed Narcan.  5.  DVT prophylaxis. The subcutaneous Lovenox.  All the records are reviewed and case discussed with ED provider. The plan of care was discussed in details with the patient (and family). I answered all questions. The patient agreed to proceed with the above mentioned plan. Further management will depend upon hospital course.   CODE STATUS: Full code  Status is: Inpatient  Remains inpatient appropriate because:Ongoing active pain requiring inpatient pain management, Ongoing diagnostic testing needed not appropriate for outpatient work up, Unsafe d/c plan, IV treatments appropriate due to intensity of illness or inability to take PO and Inpatient level of care appropriate due to severity of illness   Dispo: The patient is from: Home  Anticipated d/c is to: Home              Anticipated d/c date is: 3 days              Patient currently is not medically stable to d/c.    TOTAL TIME TAKING CARE OF THIS PATIENT: 55 minutes.    Hannah Beat M.D on 07/22/2020 at 1:06 AM  [facility]   From 7 PM-7 AM, contact  night-coverage [email or web address]  CC: Primary care physician; [facility]

## 2020-07-22 NOTE — ED Notes (Signed)
Pt asleep in bed at this time, family at bedside

## 2020-07-22 NOTE — ED Provider Notes (Addendum)
[facility] Emergency Department Provider Note   ____________________________________________   First MD Initiated Contact with Patient 07/21/20 2149     (approximate)  I have reviewed the triage vital signs and the nursing notes.   HISTORY  Chief Complaint Facial Swelling    HPI Randy Randy Woods Andrada Jr. is a 39 y.o. male who comes in reporting worsening swelling of the right side of his face starting 3 days ago after he was trying to take out an ingrown hair.  He is not having any trouble breathing or swallowing at this point.  He reports chronic dental pain which is not apparently any worse than usual.  He reports he has several rotten teeth.  He has not been running a fever.  He is not having trouble swallowing but he is having some swelling under his chin.  His pain is moderately severe.  He reports he was doing heroin but has stopped since being on Suboxone.  He is still doing methamphetamine however.        Past Medical History:  Diagnosis Date  . Anxiety   . Bipolar 1 disorder (HCC)   . Bronchitis   . Cocaine abuse (HCC)   . Depression   . Drug abuse (HCC)   . Heroin abuse (HCC)   . Schizophrenia [facility](HCC)     Patient Active Problem List   Diagnosis Date Noted  . Facial cellulitis 07/22/2020  . Opiate abuse, episodic (HCC) 02/07/2017  . Substance induced mood disorder (HCC) 02/07/2017  . Opiate overdose (HCC) 11/12/2016  . Bipolar 1 disorder, mixed, moderate (HCC) 05/07/2016  . Staphylococcal sepsis (HCC) 05/06/2016  . Severe recurrent major depression without psychotic features (HCC) 05/22/2015  . Opiate abuse, continuous (HCC) 05/22/2015  . Cocaine abuse (HCC) 05/22/2015    Past Surgical History:  Procedure Laterality Date  . NOSE SURGERY    . TEE WITHOUT CARDIOVERSION N/A 05/10/2016   Procedure: TRANSESOPHAGEAL ECHOCARDIOGRAM (TEE);  Surgeon: Laurier NancyShaukat A Khan, MD;  Location: [facility] ORS;  Service: Cardiovascular;  Laterality: N/A;  .  TYMPANOPLASTY      Prior to Admission medications   Medication Sig Start Date End Date Taking? Authorizing Provider  amphetamine-dextroamphetamine (ADDERALL) 30 MG tablet Take 30 mg by mouth 2 (two) times daily.    Yes [provider]  Buprenorphine HCl-Naloxone HCl (SUBOXONE) 8-2 MG FILM Place 1 Film under the tongue 3 (three) times daily.   Yes [provider]  clonazePAM (KLONOPIN) 1 MG tablet Take 2 mg by mouth at bedtime.    Yes [provider]  gabapentin (NEURONTIN) 400 MG capsule Take 400 mg by mouth 3 (three) times daily.   Yes [provider]  ibuprofen (MOTRIN IB) 200 MG tablet Take 2 tablets (400 mg total) by mouth every 6 (six) hours as needed for headache or moderate pain. 10/24/19 10/23/20 Yes Darci CurrentBrown, Rusk N, MD  lurasidone (LATUDA) 40 MG TABS tablet Take 40 mg by mouth daily with breakfast.   Yes [provider]  naloxone (NARCAN) nasal spray 4 mg/0.1 mL Keep accessible in case of overdose. 01/24/19  Yes Sharman CheekStafford, Phillip, MD    Allergies Bactrim [sulfamethoxazole-trimethoprim] and Sulfa antibiotics  Family History  Problem Relation Age of Onset  . Diabetes Mother   . Hypertension Father   . Kidney failure Father   . Diabetes Father     Social History Social History   Tobacco Use  . Smoking status: Current Every Day Smoker    Packs/day: 1.50  Types: Cigarettes  . Smokeless tobacco: Current User    Types: Snuff  Vaping Use  . Vaping Use: Never used  Substance Use Topics  . Alcohol use: Yes  . Drug use: Not Currently    Types: IV, Marijuana    Comment: heroine    Review of Systems  Constitutional: No fever/chills Eyes: No visual changes. ENT: No sore throat. Cardiovascular: Denies chest pain. Respiratory: Denies shortness of breath. Gastrointestinal: No abdominal pain.  No nausea, no vomiting.  No diarrhea.  No constipation. Genitourinary: Negative for dysuria. Musculoskeletal: Negative for back  pain. Skin: Negative for rash. Neurological: Negative for headaches, focal weakness   ____________________________________________   PHYSICAL EXAM:  VITAL SIGNS: ED Triage Vitals  Enc Vitals Group     BP 07/21/20 1958 (!) 168/98     Pulse Rate 07/21/20 1958 91     Resp 07/21/20 1958 16     Temp 07/21/20 1958 98.9 F (37.2 C)     Temp Source 07/21/20 1958 Oral     SpO2 07/21/20 1958 100 %     Weight 07/21/20 1959 260 lb (117.9 kg)     Height 07/21/20 1959 5\' 9"  (1.753 m)     Head Circumference --      Peak Flow --      Pain Score 07/21/20 1959 10     Pain Loc --      Pain Edu? --      Excl. in GC? --     Constitutional: Alert and oriented.  Looks uncomfortable Eyes: Conjunctivae are normal. PER Head: Atraumatic.  Face however is swollen as described in HPI there is some reddening and firmness present.  The area is painful this is consistent with at least a cellulitis is not abscess. Nose: No congestion/rhinnorhea. Mouth/Throat: Mucous membranes are moist.  Oropharynx non-erythematous. Neck: No stridor.  There is swelling under the chin about halfway from the right side of the mandible to midline.  Cardiovascular: Normal rate, regular rhythm. Grossly normal heart sounds.  Good peripheral circulation. Respiratory: Normal respiratory effort.  No retractions. Lungs CTAB. Gastrointestinal: Soft and nontender. No distention. No abdominal bruits.  Musculoskeletal: Patient reports she had to have a stitch in the left ankle because he hit a vein and was bleeding.  That area is now reddened and swollen with a broad streak going up to the top of the area where her sock would and. Neurologic:  Normal speech and language. No gross focal neurologic deficits are appreciated. No gait instability. Skin:  Skin is warm, dry and intact.    ____________________________________________   LABS (all labs ordered are listed, but only abnormal results are displayed)  Labs Reviewed  LACTIC ACID,  PLASMA - Abnormal; Notable for the following components:      Result Value   Lactic Acid, Venous 2.5 (*)    All other components within normal limits  COMPREHENSIVE METABOLIC PANEL - Abnormal; Notable for the following components:   Glucose, Bld 134 (*)    Calcium 8.6 (*)    All other components within normal limits  CBC WITH DIFFERENTIAL/PLATELET - Abnormal; Notable for the following components:   WBC 12.1 (*)    Neutro Abs 9.2 (*)    All other components within normal limits  RESPIRATORY PANEL BY RT PCR (FLU A&B, COVID)  LACTIC ACID, PLASMA  HIV ANTIBODY (ROUTINE TESTING W REFLEX)  BASIC METABOLIC PANEL  CBC   ____________________________________________  EKG   ____________________________________________  RADIOLOGY 13/08/21, personally  viewed and evaluated these images (plain radiographs) as part of my medical decision making, as well as reviewing the written report by the radiologist.  ED MD interpretation: CT shows swelling and inflammatory stranding but no drainable abscess.  Official radiology report(s): CT Soft Tissue Neck W Contrast  Result Date: 07/21/2020 CLINICAL DATA:  Initial evaluation for right-sided facial swelling for 3 days, cellulitis. EXAM: CT NECK WITH CONTRAST TECHNIQUE: Multidetector CT imaging of the neck was performed using the standard protocol following the bolus administration of intravenous contrast. CONTRAST:  6mL OMNIPAQUE IOHEXOL 300 MG/ML  SOLN COMPARISON:  None available. FINDINGS: Pharynx and larynx: Oral cavity within normal limits. Palatine tonsils symmetric and normal. No discrete tonsillar or peritonsillar abscess. Remainder of the oropharynx and nasopharynx within normal limits. No retropharyngeal collection or swelling. Epiglottis within normal limits. Vallecula largely clear. Remainder of the hypopharynx and supraglottic larynx within normal limits. Glottis normal. Subglottic airway clear. Salivary glands: Prominent inflammatory  stranding surrounds the right parotid gland as well as the right greater than left submandibular glands related to the acute inflammatory process within the right face. Salivary glands and cells within normal limits. Thyroid: Normal. Lymph nodes: Prominent right greater than left submental nodes measure up to 12 mm. Right greater than left level IB nodes measure up to 1 cm. Mildly prominent level II nodes measure up to 12 mm. Findings presumably reactive. Vascular: Normal intravascular enhancement seen throughout the neck. Limited intracranial: Unremarkable. Visualized orbits: Unremarkable. Mastoids and visualized paranasal sinuses: Visualized paranasal sinuses are largely clear. Visualize mastoids and middle ear cavities are well pneumatized and free of fluid. Skeleton: No visible acute osseous abnormality. No discrete or worrisome osseous lesions. Upper chest: Visualized upper chest demonstrates no acute finding. Partially visualized lungs are grossly clear. Other: Extensive soft tissue swelling with inflammatory stranding seen throughout the right face, primarily involving the right masticator space, concerning for acute infection/cellulitis. Hazy inflammatory stranding extends into the right parapharyngeal space, as well as the submental region and across the midline to the left submandibular space. No frank invasion into the floor of mouth at this time. Associated stranding within the right retro antral fat. There are a few scattered dental caries about the remaining teeth, which could reflect an odontogenic source. Somewhat ill-defined hypodensity within the right masticator space measuring approximately 9 mm suspicious for phlegmon (series 2, image 41). No frank discrete or drainable abscess identified. No definite abscess about the teeth, although evaluation somewhat limited by streak artifact from dental amalgam. IMPRESSION: 1. Extensive soft tissue swelling with inflammatory stranding throughout the right  face, primarily involving the right masticator space, concerning for acute infection/cellulitis. Somewhat ill-defined hypodensity within the right masticator space measuring approximately 9 mm suspicious for phlegmon, although no definite discrete or drainable abscess identified at this time. Few scattered dental caries about the teeth, which could reflect an odontogenic source. 2. Enlarged bilateral cervical adenopathy, likely reactive. Electronically Signed   By: Rise Mu M.D.   On: 07/21/2020 23:09    ____________________________________________   PROCEDURES  Procedure(s) performed (including Critical Care):  Procedures   ____________________________________________   INITIAL IMPRESSION / ASSESSMENT AND PLAN / ED COURSE  Patient with swelling warmth redness and firmness consistent with cellulitis in the right side of his face and part of the submandibular area.  I am uncertain if this is from his facial hair that he was working on or from a tooth abscess.  He does not currently have a drainable abscess but as the swelling is  progressing into the submandibular area could develop into Ludwick's angina.  This could be very serious.  Additionally his lactic acid and white count are elevated.  For these reasons we will get him in the hospital for IV antibiotics.  The IV antibiotic should also work on the apparent cellulitis with possible lymphangitis in his left ankle.  ----------------------------------------- 1:35 AM on 07/22/2020 -----------------------------------------  Patient is already been admitted upstairs.  I will talk to Dr. Phillips Odor and make sure we remove that stitch.  I have asked the person who page is the hospitalist the patient for me.    ----------------------------------------- 2:21 AM on 07/22/2020 -----------------------------------------  Patient actually did not go upstairs he is in room 38.  I will see him again cleaned his ankle off with alcohol and cut  the stitch and I believe I pulled it out.  It was very tight.  Patient tolerated very well.         ____________________________________________   FINAL CLINICAL IMPRESSION(S) / ED DIAGNOSES  Final diagnoses:  Facial cellulitis  Cellulitis of left ankle     ED Discharge Orders    None      *Please note:  Randy Woods. was evaluated in Emergency Department on 07/22/2020 for the symptoms described in the history of present illness. He was evaluated in the context of the global COVID-19 pandemic, which necessitated consideration that the patient might be at risk for infection with the SARS-CoV-2 virus that causes COVID-19. Institutional protocols and algorithms that pertain to the evaluation of patients at risk for COVID-19 are in a state of rapid change based on information released by regulatory bodies including the CDC and federal and state organizations. These policies and algorithms were followed during the patient's care in the ED.  Some ED evaluations and interventions may be delayed as a result of limited staffing during and the pandemic.*   Note:  This document was prepared using Dragon voice recognition software and may include unintentional dictation errors.    Arnaldo Natal, MD 07/22/20 0139    Arnaldo Natal, MD 07/22/20 Earle Gell

## 2020-07-22 NOTE — Progress Notes (Signed)
PROGRESS NOTE    Randy Woods.  AJO:[id number] DOB: 1980/10/14 DOA: 07/21/2020 [facility]CP: [name]    Assessment & Plan:   Active Problems:   Facial cellulitis  Right facial severe nonpurulent cellulitis: continue on IV cefepime, IV flagyl, IV vanco and IV solumedrol. CT soft tissue neck shows acute infection/cellulitis no discrete or drainable abscess identified & few scattered dental caries. Recommend that the pt see a dentist as an outpatient. Blood cxs ordered   Leukocytosis: likely secondary to above. Continue on IV abxs.   Elevated BP: likely secondary to pain. IV hydralazine prn   Bipolar disorder: unknown type or severity. Continue on home dose of latuda, klonopin   Polysubstance abuse: continue on suboxone. Illicit drug abuse cessation counseling. Previous hx of IVDA, clean for a year as per pt's partner at bedside   Obesity: BMI 38.4. Complicates overall care and prognosis    DVT prophylaxis: lovenox  Code Status:  Full  Family Communication: discussed pt's care w/ pt's partner at bedside and answered her questions Disposition Plan: likely d/c back her home    Status is: Inpatient  Remains inpatient appropriate because:Ongoing diagnostic testing needed not appropriate for outpatient work up and IV treatments appropriate due to intensity of illness or inability to take PO   Dispo: The patient is from: Home              Anticipated d/c is to: Home              Anticipated d/c date is: 2 days              Patient currently is not medically stable to d/c.      Consultants:      Procedures:    Antimicrobials: cefepime, flagyl, vanco   Subjective: Pt c/o facial pain   Objective: Vitals:   07/22/20 0618 07/22/20 0700 07/22/20 0730 07/22/20 0800  BP: (!) 146/94 (!) 151/94 (!) 140/92 (!) 142/88  Pulse: 98 (!) 104 (!) 110 (!) 110  Resp: 18   18  Temp:      TempSrc:      SpO2: 97% 100% 93% 93%  Weight:      Height:          Intake/Output Summary (Last 24 hours) at 07/22/2020 9470 Last data filed at 07/21/2020 2330 Gross per 24 hour  Intake 100 ml  Output --  Net 100 ml   Filed Weights   07/21/20 1959  Weight: 117.9 kg    Examination:  General exam: Appears calm and comfortable  Respiratory system: Clear to auscultation. Respiratory effort normal. Cardiovascular system: S1 & S2 +. No rubs, gallops or clicks. Gastrointestinal system: Abdomen is obese, soft and nontender. Normal bowel sounds heard. Central nervous system: Alert and oriented. Moves all 4 extremities  Psychiatry: Judgement and insight appear normal. Flat mood and affect.     Data Reviewed: I have personally reviewed following labs and imaging studies  CBC: Recent Labs  Lab 07/21/20 2005 07/22/20 0352  WBC 12.1* 15.2*  NEUTROABS 9.2*  --   HGB 13.5 15.5  HCT 39.7 45.0  MCV 88.0 88.1  PLT 165 177   Basic Metabolic Panel: Recent Labs  Lab 07/21/20 2005 07/22/20 0352  NA 137 134*  K 3.8 3.5  CL 98 99  CO2 26 26  GLUCOSE 134* 107*  BUN 13 9  CREATININE 0.68 0.65  CALCIUM 8.6* 8.5*   GFR: Estimated Creatinine Clearance: 155.6 mL/min (by C-G  formula based on SCr of 0.65 mg/dL). Liver Function Tests: Recent Labs  Lab 07/21/20 2005  AST 25  ALT 27  ALKPHOS 71  BILITOT 0.6  PROT 7.9  ALBUMIN 4.1   No results for input(s): LIPASE, AMYLASE in the last 168 hours. No results for input(s): AMMONIA in the last 168 hours. Coagulation Profile: No results for input(s): INR, PROTIME in the last 168 hours. Cardiac Enzymes: No results for input(s): CKTOTAL, CKMB, CKMBINDEX, TROPONINI in the last 168 hours. BNP (last 3 results) No results for input(s): PROBNP in the last 8760 hours. HbA1C: No results for input(s): HGBA1C in the last 72 hours. CBG: No results for input(s): GLUCAP in the last 168 hours. Lipid Profile: No results for input(s): CHOL, HDL, LDLCALC, TRIG, CHOLHDL, LDLDIRECT in the last 72 hours. Thyroid  Function Tests: No results for input(s): TSH, T4TOTAL, FREET4, T3FREE, THYROIDAB in the last 72 hours. Anemia Panel: No results for input(s): VITAMINB12, FOLATE, FERRITIN, TIBC, IRON, RETICCTPCT in the last 72 hours. Sepsis Labs: Recent Labs  Lab 07/21/20 2005 07/21/20 2153  LATICACIDVEN 2.5* 1.2    Recent Results (from the past 240 hour(s))  Respiratory Panel by RT PCR (Flu A&B, Covid) - Nasopharyngeal Swab     Status: None   Collection Time: 07/21/20 12:27 AM   Specimen: Nasopharyngeal Swab  Result Value Ref Range Status   SARS Coronavirus 2 by RT PCR NEGATIVE NEGATIVE Final    Comment: (NOTE) SARS-CoV-2 target nucleic acids are NOT DETECTED.  The SARS-CoV-2 RNA is generally detectable in upper respiratoy specimens during the acute phase of infection. The lowest concentration of SARS-CoV-2 viral copies this assay can detect is 131 copies/mL. A negative result does not preclude SARS-Cov-2 infection and should not be used as the sole basis for treatment or other patient management decisions. A negative result may occur with  improper specimen collection/handling, submission of specimen other than nasopharyngeal swab, presence of viral mutation(s) within the areas targeted by this assay, and inadequate number of viral copies (<131 copies/mL). A negative result must be combined with clinical observations, patient history, and epidemiological information. The expected result is Negative.  Fact Sheet for Patients:  https://www.moore.com/  Fact Sheet for Healthcare Providers:  https://www.young.biz/  This test is no t yet approved or cleared by the [place] FDA and  has been authorized for detection and/or diagnosis of SARS-CoV-2 by FDA under an Emergency Use Authorization (EUA). This EUA will remain  in effect (meaning this test can be used) for the duration of the COVID-19 declaration under Section 564(b)(1) of the Act, 21  U.S.C. section 360bbb-3(b)(1), unless the authorization is terminated or revoked sooner.     Influenza A by PCR NEGATIVE NEGATIVE Final   Influenza B by PCR NEGATIVE NEGATIVE Final    Comment: (NOTE) The Xpert Xpress SARS-CoV-2/FLU/RSV assay is intended as an aid in  the diagnosis of influenza from Nasopharyngeal swab specimens and  should not be used as a sole basis for treatment. Nasal washings and  aspirates are unacceptable for Xpert Xpress SARS-CoV-2/FLU/RSV  testing.  Fact Sheet for Patients: https://www.moore.com/  Fact Sheet for Healthcare Providers: https://www.young.biz/  This test is not yet approved or cleared by the [place] FDA and  has been authorized for detection and/or diagnosis of SARS-CoV-2 by  FDA under an Emergency Use Authorization (EUA). This EUA will remain  in effect (meaning this test can be used) for the duration of the  Covid-19 declaration under Section 564(b)(1) of the Act, 21  U.S.C. section 360bbb-3(b)(1), unless the authorization is  terminated or revoked. Performed at [facility], [address] [place], [place] [address]          Radiology Studies: CT Soft Tissue Neck W Contrast  Result Date: 07/21/2020 CLINICAL DATA:  Initial evaluation for right-sided facial swelling for 3 days, cellulitis. EXAM: CT NECK WITH CONTRAST TECHNIQUE: Multidetector CT imaging of the neck was performed using the standard protocol following the bolus administration of intravenous contrast. CONTRAST:  75mL OMNIPAQUE IOHEXOL 300 MG/ML  SOLN COMPARISON:  None available. FINDINGS: Pharynx and larynx: Oral cavity within normal limits. Palatine tonsils symmetric and normal. No discrete tonsillar or peritonsillar abscess. Remainder of the oropharynx and nasopharynx within normal limits. No retropharyngeal collection or swelling. Epiglottis within normal limits. Vallecula largely clear. Remainder of the hypopharynx and  supraglottic larynx within normal limits. Glottis normal. Subglottic airway clear. Salivary glands: Prominent inflammatory stranding surrounds the right parotid gland as well as the right greater than left submandibular glands related to the acute inflammatory process within the right face. Salivary glands and cells within normal limits. Thyroid: Normal. Lymph nodes: Prominent right greater than left submental nodes measure up to 12 mm. Right greater than left level IB nodes measure up to 1 cm. Mildly prominent level II nodes measure up to 12 mm. Findings presumably reactive. Vascular: Normal intravascular enhancement seen throughout the neck. Limited intracranial: Unremarkable. Visualized orbits: Unremarkable. Mastoids and visualized paranasal sinuses: Visualized paranasal sinuses are largely clear. Visualize mastoids and middle ear cavities are well pneumatized and free of fluid. Skeleton: No visible acute osseous abnormality. No discrete or worrisome osseous lesions. Upper chest: Visualized upper chest demonstrates no acute finding. Partially visualized lungs are grossly clear. Other: Extensive soft tissue swelling with inflammatory stranding seen throughout the right face, primarily involving the right masticator space, concerning for acute infection/cellulitis. Hazy inflammatory stranding extends into the right parapharyngeal space, as well as the submental region and across the midline to the left submandibular space. No frank invasion into the floor of mouth at this time. Associated stranding within the right retro antral fat. There are a few scattered dental caries about the remaining teeth, which could reflect an odontogenic source. Somewhat ill-defined hypodensity within the right masticator space measuring approximately 9 mm suspicious for phlegmon (series 2, image 41). No frank discrete or drainable abscess identified. No definite abscess about the teeth, although evaluation somewhat limited by streak  artifact from dental amalgam. IMPRESSION: 1. Extensive soft tissue swelling with inflammatory stranding throughout the right face, primarily involving the right masticator space, concerning for acute infection/cellulitis. Somewhat ill-defined hypodensity within the right masticator space measuring approximately 9 mm suspicious for phlegmon, although no definite discrete or drainable abscess identified at this time. Few scattered dental caries about the teeth, which could reflect an odontogenic source. 2. Enlarged bilateral cervical adenopathy, likely reactive. Electronically Signed   By: Rise MuBenjamin  McClintock M.D.   On: 07/21/2020 23:09        Scheduled Meds:  amphetamine-dextroamphetamine  30 mg Oral BID   buprenorphine-naloxone  1 tablet Sublingual TID   clonazePAM  1 mg Oral QHS   enoxaparin (LOVENOX) injection  60 mg Subcutaneous Q24H   gabapentin  400 mg Oral TID   lurasidone  40 mg Oral Q breakfast   methylPREDNISolone (SOLU-MEDROL) injection  40 mg Intravenous Q8H   naloxone  0.4 mg Nasal Once   Continuous Infusions:  sodium chloride 100 mL/hr at 07/22/20 0323   ceFEPime (MAXIPIME) IV  metronidazole     vancomycin       LOS: 0 days    Time spent: 33 mins    Charise Killian, MD [facility] Pager [phone]  If 7PM-7AM, please contact night-coverage 07/22/2020, 8:22 AM

## 2020-07-22 NOTE — Consult Note (Signed)
Randy Woods, Stille [id number] Jun 08, 1981 Charise Killian, MD  Reason for Consult: Facial swelling and cellulitis  HPI: 39 year old male into the emergency room with right-sided facial swelling.  He tells me he has had dental issues off and on for at least a year noticed a swelling in the last 4 to 5 days gotten progressively worse.  Presented to the emergency room for evaluation.  Allergies:  Allergies  Allergen Reactions  . Bactrim [Sulfamethoxazole-Trimethoprim]   . Sulfa Antibiotics Other (See Comments)    Pt states "the skin peels off of my penis"    ROS: Review of systems normal other than 12 systems except per HPI.  PMH:  Past Medical History:  Diagnosis Date  . Anxiety   . Bipolar 1 disorder (HCC)   . Bronchitis   . Cocaine abuse (HCC)   . Depression   . Drug abuse (HCC)   . Heroin abuse (HCC)   . Schizophrenia (HCC)     FH:  Family History  Problem Relation Age of Onset  . Diabetes Mother   . Hypertension Father   . Kidney failure Father   . Diabetes Father     SH:  Social History   Socioeconomic History  . Marital status: Widowed    Spouse name: Not on file  . Number of children: Not on file  . Years of education: Not on file  . Highest education level: Not on file  Occupational History  . Not on file  Tobacco Use  . Smoking status: Current Every Day Smoker    Packs/day: 1.50    Types: Cigarettes  . Smokeless tobacco: Current User    Types: Snuff  Vaping Use  . Vaping Use: Never used  Substance and Sexual Activity  . Alcohol use: Yes  . Drug use: Not Currently    Types: IV, Marijuana    Comment: heroine  . Sexual activity: Not on file  Other Topics Concern  . Not on file  Social History Narrative  . Not on file   Social Determinants of Health   Financial Resource Strain:   . Difficulty of Paying Living Expenses: Not on file  Food Insecurity:   . Worried About Programme researcher, broadcasting/film/video in the Last Year: Not on file  . Ran Out of Food in the  Last Year: Not on file  Transportation Needs:   . Lack of Transportation (Medical): Not on file  . Lack of Transportation (Non-Medical): Not on file  Physical Activity:   . Days of Exercise per Week: Not on file  . Minutes of Exercise per Session: Not on file  Stress:   . Feeling of Stress : Not on file  Social Connections:   . Frequency of Communication with Friends and Family: Not on file  . Frequency of Social Gatherings with Friends and Family: Not on file  . Attends Religious Services: Not on file  . Active Member of Clubs or Organizations: Not on file  . Attends Banker Meetings: Not on file  . Marital Status: Not on file  Intimate Partner Violence:   . Fear of Current or Ex-Partner: Not on file  . Emotionally Abused: Not on file  . Physically Abused: Not on file  . Sexually Abused: Not on file    PSH:  Past Surgical History:  Procedure Laterality Date  . NOSE SURGERY    . TEE WITHOUT CARDIOVERSION N/A 05/10/2016   Procedure: TRANSESOPHAGEAL ECHOCARDIOGRAM (TEE);  Surgeon: [name], MD;  Location: [facility]  ORS;  Service: Cardiovascular;  Laterality: N/A;  . TYMPANOPLASTY      Physical  Exam: CN 2-12 grossly intact and symmetric.  External ears appear normal.  He has obvious facial swelling over the right maxilla extending down into the right mandible, there is significant acne with bilateral facial small pustules.  Examination of the oral cavity shows multiple broken rotten teeth mainly in the maxilla appears to have 1 broken tooth right posterior on the mandible as well the tongue is free of motion the floor mouth is clear no evidence of edema or swelling.  The neck has mild to moderate adenopathy.   A/P: Facial cellulitis most likely related to severe periodontal disease.  He has multiple broken rotten teeth in the maxilla which are the likely source of this infection.  I reviewed the CT scan personally there is significant swelling but no evidence of abscess  which needs draining.  Based on the source is most likely being dental, would recommend clindamycin intravenously dosed based on his weight, and Decadron 10 mg IV every 6 hours.  He tells me with the current medications that his swelling is already improved and I suspect that further IV antibiotics and steroids will continue to improve his symptoms.  Would discharged home on clindamycin 300 p.o. 3 times daily for 2 weeks and a 12-day double strength Sterapred taper after 36 to 48 hours of IV medication..  Would also consult care management to organize an appointment with outpatient dentist for immediate evaluation and tooth extraction.  Hospitalist can also consider transfer to the main [facility] campus who has oralmedicine on staff who could possibly remove his teeth while hospitalized.  If his symptoms worsen would repeat CT scan to be sure no abscess is formed however clinically appears to be improving.  I am happy to follow-up with him as an outpatient, however his follow-up should really be with dental evaluation ASAP.    Emergency room consult time approximately 30 minutes   Davina Poke 07/22/2020 8:41 AM

## 2020-07-22 NOTE — Progress Notes (Signed)
PHARMACIST - PHYSICIAN COMMUNICATION  CONCERNING:  Enoxaparin (Lovenox) for DVT Prophylaxis    RECOMMENDATION: Patient was prescribed enoxaprin 40mg  q24 hours for VTE prophylaxis.   Filed Weights   07/21/20 1959  Weight: 117.9 kg (260 lb)    Body mass index is 38.4 kg/m.  Estimated Creatinine Clearance: 155.6 mL/min (by C-G formula based on SCr of 0.65 mg/dL).  Based on [facility] policy patient is candidate for enoxaparin 0.5mg /kg TBW SQ every 24 hours based on BMI being >30.  DESCRIPTION: Pharmacy has adjusted enoxaparin dose per [facility] policy.  Patient is now receiving enoxaparin 60 mg every 24 hours    [facility], PharmD Clinical Pharmacist  07/22/2020 6:38 AM

## 2020-07-22 NOTE — Progress Notes (Signed)
Pharmacy Antibiotic Note  Randy Woods. is a 39 y.o. male admitted on 07/21/2020 with cellulitis.  Pharmacy has been consulted for Vancomycin and Cefepime dosing.  Plan: Cefepime 2gm IV q8hrs Vancomycin 2gm x 1 then 1gm IV q8hrs  Height: 5\' 9"  (175.3 cm) Weight: 117.9 kg (260 lb) IBW/kg (Calculated) : 70.7  Temp (24hrs), Avg:98.9 F (37.2 C), Min:98.9 F (37.2 C), Max:98.9 F (37.2 C)  Recent Labs  Lab 07/21/20 2005 07/21/20 2153  WBC 12.1*  --   CREATININE 0.68  --   LATICACIDVEN 2.5* 1.2    Estimated Creatinine Clearance: 155.6 mL/min (by C-G formula based on SCr of 0.68 mg/dL).    Allergies  Allergen Reactions  . Bactrim [Sulfamethoxazole-Trimethoprim]   . Sulfa Antibiotics Other (See Comments)    Pt states "the skin peels off of my penis"    Antimicrobials this admission:   >>    >>   Dose adjustments this admission:   Microbiology results:  BCx:   UCx:    Sputum:    MRSA PCR:   Thank you for allowing pharmacy to be a part of this patient's care.  2154 A 07/22/2020 3:37 AM

## 2020-07-22 NOTE — ED Notes (Signed)
This RN reported to pts room due to pts RN concern for behavior. Upon arrival, pt cursing and yelling in room. Pt reports no one is getting his medication and that he had to wait 70 minutes for ice cream. This RN attempted to explain delay and pt responded by continuing to swear and yell. After pt calm, this RN explained delay and moved pt to major side for closer observation due to behavior. Pt given medication once roomed in RM 10 and deescalated. Pt provides verbal understanding that his behavior is not acceptable and that staff will work hard to provide him with care as soon as means are available to do so.

## 2020-07-22 NOTE — Progress Notes (Addendum)
Pt is beligerent toward this RN because he has not received his Suboxone and Adderral this evening. RN spoke with provider, Dr. Phillips Odor, and pharmacist about his concerns. Pharmacist informed this RN that [facility] pharmacy does not supply suboxone. However, the patient may bring his own supply, register it with the pharmacist here to be administered. An option to that would be to administer the tablet form, which would still be given later this morning. The Adderall, is given only during daylight hours, since it is an amphetamine. Provider is aware. Charge nurse aware. Will monitor patient until bed on unit is available.

## 2020-07-22 NOTE — Progress Notes (Addendum)
PHARMACIST - PHYSICIAN COMMUNICATION DR:  TR[facility] CONCERNING: Antibiotic IV to Oral Route Change Policy  RECOMMENDATION: This patient is receiving metronidazole by the intravenous route.  Based on criteria approved by the Pharmacy and Therapeutics Committee, the antibiotic(s) is/are being converted to the equivalent oral dose form(s).   Metronidazole IV is on national backorder, patient has diet and tolerating po medications   DESCRIPTION: These criteria include:  Patient being treated for a respiratory tract infection, urinary tract infection, cellulitis or clostridium difficile associated diarrhea if on metronidazole  The patient is not neutropenic and does not exhibit a GI malabsorption state  The patient is eating (either orally or via tube) and/or has been taking other orally administered medications for a least 24 hours  The patient is improving clinically and has a Tmax < 100.5  If you have questions about this conversion, please contact the Pharmacy Department  []   [phone] )  ( [phone] [x]   [phone] )  [facility] []   [phone] )  [facility] []   [phone] )  [facility] []   [phone] )    ( [phone], PharmD, BCPS.   Work Cell: [phone] 07/22/2020 8:13 AM
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
## 2020-07-23 NOTE — Progress Notes (Signed)
Cross Cover Just informed patient signed ou against medical advice

## 2020-07-27 LAB — CULTURE, BLOOD (ROUTINE X 2)
Culture: NO GROWTH
Culture: NO GROWTH
Special Requests: ADEQUATE
Special Requests: ADEQUATE
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
## 2020-08-16 ENCOUNTER — Emergency Department
Admission: EM | Admit: 2020-08-16 | Discharge: 2020-08-16 | Discharge status: Against Medical Advice | Payer: Medicaid Other | Attending: Emergency Medicine | Admitting: Emergency Medicine

## 2020-08-16 ENCOUNTER — Other Ambulatory Visit: Payer: Self-pay

## 2020-08-16 DIAGNOSIS — F17210 Nicotine dependence, cigarettes, uncomplicated: Secondary | ICD-10-CM | POA: Insufficient documentation

## 2020-08-16 DIAGNOSIS — T50901A Poisoning by unspecified drugs, medicaments and biological substances, accidental (unintentional), initial encounter: Secondary | ICD-10-CM

## 2020-08-16 DIAGNOSIS — T401X1A Poisoning by heroin, accidental (unintentional), initial encounter: Secondary | ICD-10-CM | POA: Insufficient documentation

## 2020-08-16 NOTE — Discharge Instructions (Signed)
Please seek medical attention for any high fevers, chest pain, shortness of breath, change in behavior, persistent vomiting, bloody stool or any other new or concerning symptoms.  

## 2020-08-16 NOTE — ED Notes (Signed)
Patient to ED via EMS from the [facility] for possible drug overdose. Patient told EMS heroin or possibly Fentanyl. No narcan was required. VS WNL.

## 2020-08-16 NOTE — ED Notes (Signed)
Randy Woods called and would like pt to call her @ [phone].

## 2020-08-16 NOTE — ED Provider Notes (Signed)
[facility] Emergency Department Provider Note    ____________________________________________   I have reviewed the triage vital signs and the nursing notes.   HISTORY  Chief Complaint Drug Overdose   History limited by: Not limited   HPI Randy Woods. is a 39 y.o. male who presents to the emergency department today because of concern for possible overdose. Patient is somewhat somnolent but awakens easily to verbal stimuli. He thinks that he likely took fentanyl. He says he took it trying to get high and not with any intention of harming himself. Denies any SI. The patient denies any chest pain or shortness of breath. Per report narcan was not used.    Records reviewed. Per medical record review patient has a history of opioid use, ed visits for accidental overdose in the past.   Past Medical History:  Diagnosis Date  . Anxiety   . Bipolar 1 disorder (HCC)   . Bronchitis   . Cocaine abuse (HCC)   . Depression   . Drug abuse (HCC)   . Heroin abuse (HCC)   . Schizophrenia [facility])     Patient Active Problem List   Diagnosis Date Noted  . Facial cellulitis 07/22/2020  . Opiate abuse, episodic (HCC) 02/07/2017  . Substance induced mood disorder (HCC) 02/07/2017  . Opiate overdose (HCC) 11/12/2016  . Bipolar 1 disorder, mixed, moderate (HCC) 05/07/2016  . Staphylococcal sepsis (HCC) 05/06/2016  . Severe recurrent major depression without psychotic features (HCC) 05/22/2015  . Opiate abuse, continuous (HCC) 05/22/2015  . Cocaine abuse (HCC) 05/22/2015    Past Surgical History:  Procedure Laterality Date  . NOSE SURGERY    . TEE WITHOUT CARDIOVERSION N/A 05/10/2016   Procedure: TRANSESOPHAGEAL ECHOCARDIOGRAM (TEE);  Surgeon: [name], MD;  Location: [facility] ORS;  Service: Cardiovascular;  Laterality: N/A;  . TYMPANOPLASTY      Prior to Admission medications   Medication Sig Start Date End Date Taking? Authorizing Provider   amphetamine-dextroamphetamine (ADDERALL) 30 MG tablet Take 30 mg by mouth 2 (two) times daily.     [provider]  Buprenorphine HCl-Naloxone HCl (SUBOXONE) 8-2 MG FILM Place 1 Film under the tongue 3 (three) times daily.     [provider]  clonazePAM (KLONOPIN) 1 MG tablet Take 2 mg by mouth at bedtime.     [provider]  gabapentin (NEURONTIN) 400 MG capsule Take 400 mg by mouth 3 (three) times daily.    [provider]  ibuprofen (MOTRIN IB) 200 MG tablet Take 2 tablets (400 mg total) by mouth every 6 (six) hours as needed for headache or moderate pain. 10/24/19 10/23/20  Darci Current, MD  lurasidone (LATUDA) 40 MG TABS tablet Take 40 mg by mouth daily with breakfast.    [provider]  naloxone Brand [facility]) nasal spray 4 mg/0.1 mL Keep accessible in case of overdose. 01/24/19   Sharman Cheek, MD    Allergies Bactrim [sulfamethoxazole-trimethoprim] and Sulfa antibiotics  Family History  Problem Relation Age of Onset  . Diabetes Mother   . Hypertension Father   . Kidney failure Father   . Diabetes Father     Social History Social History   Tobacco Use  . Smoking status: Current Every Day Smoker    Packs/day: 1.50    Types: Cigarettes  . Smokeless tobacco: Current User    Types: Snuff  Vaping Use  . Vaping Use: Never used  Substance Use Topics  . Alcohol use: Yes  . Drug  use: Not Currently    Types: IV, Marijuana    Comment: heroine    Review of Systems Constitutional: No fever/chills Eyes: No visual changes. ENT: No sore throat. Cardiovascular: Denies chest pain. Respiratory: Denies shortness of breath. Gastrointestinal: No abdominal pain.  No nausea, no vomiting.  No diarrhea.   Genitourinary: Negative for dysuria. Musculoskeletal: Negative for back pain. Skin: Negative for rash. Neurological: Negative for headaches, focal weakness or numbness.  ____________________________________________   PHYSICAL  EXAM:  VITAL SIGNS: ED Triage Vitals  Enc Vitals Group     BP 08/16/20 1716 (!) 110/55     Pulse Rate 08/16/20 1716 77     Resp 08/16/20 1716 18     Temp 08/16/20 1716 97.8 F (36.6 C)     Temp Source 08/16/20 1716 Oral     SpO2 08/16/20 1716 98 %     Weight --      Height --      Head Circumference --      Peak Flow --      Pain Score 08/16/20 1717 0   Constitutional: Slightly somnolent but awakens easily to verbal stimuli. Eyes: Conjunctivae are normal.  ENT      Head: Normocephalic and atraumatic.      Nose: No congestion/rhinnorhea.      Mouth/Throat: Mucous membranes are moist.      Neck: No stridor. Hematological/Lymphatic/Immunilogical: No cervical lymphadenopathy. Cardiovascular: Normal rate, regular rhythm.  No murmurs, rubs, or gallops.  Respiratory: Normal respiratory effort without tachypnea nor retractions. Breath sounds are clear and equal bilaterally. No wheezes/rales/rhonchi. Gastrointestinal: Soft and non tender. No rebound. No guarding.  Genitourinary: Deferred Musculoskeletal: Normal range of motion in all extremities. No lower extremity edema. Neurologic:  Somnolent but awakens easily. When awake normal speech and language. No gross focal neurologic deficits are appreciated.  Skin:  Skin is warm, dry and intact. No rash noted. Psychiatric: Mood and affect are normal. Speech and behavior are normal. Patient exhibits appropriate insight and judgment.  ____________________________________________    LABS (pertinent positives/negatives)  None  ____________________________________________   EKG  None  ____________________________________________    RADIOLOGY  None  ____________________________________________   PROCEDURES  Procedures  ____________________________________________   INITIAL IMPRESSION / ASSESSMENT AND PLAN / ED COURSE  Pertinent labs & imaging results that were available during my care of the patient were reviewed by me  and considered in my medical decision making (see chart for details).   Patient presented to the emergency department today after an apparent opioid overdose. Patient did not require narcan. Was initially somnolent on exam however woke up easily to verbal stimuli and then was awake and alert. The patient was observed in the emergency department for many hours without any episodes requiring narcan. Plan was to continue to watch for continued sobriety however the patient left on his own accord.  ____________________________________________   FINAL CLINICAL IMPRESSION(S) / ED DIAGNOSES  Final diagnoses:  Accidental drug overdose, initial encounter     Note: This dictation was prepared with Dragon dictation. Any transcriptional errors that result from this process are unintentional     Phineas Semen, MD 08/16/20 2014
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
## 2020-08-16 NOTE — ED Notes (Signed)
Went to check on patient, and he was unable to be located. Patient left before being discharged.

## 2020-09-25 ENCOUNTER — Encounter: Payer: Self-pay | Admitting: *Deleted

## 2020-09-25 ENCOUNTER — Emergency Department
Admission: EM | Admit: 2020-09-25 | Discharge: 2020-09-25 | Disposition: A | Discharge status: Home / Self Care | Payer: Medicaid Other | Attending: Emergency Medicine | Admitting: Emergency Medicine

## 2020-09-25 ENCOUNTER — Other Ambulatory Visit: Payer: Self-pay

## 2020-09-25 ENCOUNTER — Ambulatory Visit (HOSPITAL_COMMUNITY)
Admission: AD | Admit: 2020-09-25 | Discharge: 2020-09-25 | Disposition: A | Discharge status: Other Acute Inpatient Hospital | Payer: Medicaid Other | Source: Other Acute Inpatient Hospital | Attending: Surgery | Admitting: Surgery

## 2020-09-25 DIAGNOSIS — T23232A Burn of second degree of multiple left fingers (nail), not including thumb, initial encounter: Secondary | ICD-10-CM | POA: Insufficient documentation

## 2020-09-25 DIAGNOSIS — T23292A Burn of second degree of multiple sites of left wrist and hand, initial encounter: Secondary | ICD-10-CM | POA: Diagnosis present

## 2020-09-25 DIAGNOSIS — T3110 Burns involving 10-19% of body surface with 0% to 9% third degree burns: Secondary | ICD-10-CM | POA: Diagnosis not present

## 2020-09-25 DIAGNOSIS — Z23 Encounter for immunization: Secondary | ICD-10-CM | POA: Insufficient documentation

## 2020-09-25 DIAGNOSIS — T23272A Burn of second degree of left wrist, initial encounter: Secondary | ICD-10-CM | POA: Insufficient documentation

## 2020-09-25 DIAGNOSIS — Z79899 Other long term (current) drug therapy: Secondary | ICD-10-CM | POA: Insufficient documentation

## 2020-09-25 DIAGNOSIS — X030XXA Exposure to flames in controlled fire, not in building or structure, initial encounter: Secondary | ICD-10-CM | POA: Diagnosis not present

## 2020-09-25 DIAGNOSIS — X04XXXA Exposure to ignition of highly flammable material, initial encounter: Secondary | ICD-10-CM | POA: Diagnosis not present

## 2020-09-25 DIAGNOSIS — Z20822 Contact with and (suspected) exposure to covid-19: Secondary | ICD-10-CM | POA: Diagnosis not present

## 2020-09-25 DIAGNOSIS — F17210 Nicotine dependence, cigarettes, uncomplicated: Secondary | ICD-10-CM | POA: Diagnosis not present

## 2020-09-25 LAB — CBC WITH DIFFERENTIAL/PLATELET
Abs Immature Granulocytes: 0.03 10*3/uL (ref 0.00–0.07)
Basophils Absolute: 0.1 10*3/uL (ref 0.0–0.1)
Basophils Relative: 1 %
Eosinophils Absolute: 0.1 10*3/uL (ref 0.0–0.5)
Eosinophils Relative: 1 %
HCT: 43.9 % (ref 39.0–52.0)
Hemoglobin: 14.5 g/dL (ref 13.0–17.0)
Immature Granulocytes: 0 %
Lymphocytes Relative: 22 %
Lymphs Abs: 2.1 10*3/uL (ref 0.7–4.0)
MCH: 27.8 pg (ref 26.0–34.0)
MCHC: 33 g/dL (ref 30.0–36.0)
MCV: 84.3 fL (ref 80.0–100.0)
Monocytes Absolute: 0.6 10*3/uL (ref 0.1–1.0)
Monocytes Relative: 6 %
Neutro Abs: 7 10*3/uL (ref 1.7–7.7)
Neutrophils Relative %: 70 %
Platelets: 188 10*3/uL (ref 150–400)
RBC: 5.21 MIL/uL (ref 4.22–5.81)
RDW: 11.9 % (ref 11.5–15.5)
WBC: 9.9 10*3/uL (ref 4.0–10.5)
nRBC: 0 % (ref 0.0–0.2)

## 2020-09-25 LAB — BASIC METABOLIC PANEL
Anion gap: 9 (ref 5–15)
BUN: 14 mg/dL (ref 6–20)
CO2: 25 mmol/L (ref 22–32)
Calcium: 9.1 mg/dL (ref 8.9–10.3)
Chloride: 104 mmol/L (ref 98–111)
Creatinine, Ser: 0.69 mg/dL (ref 0.61–1.24)
GFR, Estimated: >60 mL/min (ref >60)
Glucose, Bld: 111 mg/dL — ABNORMAL HIGH (ref 70–99)
Potassium: 3.9 mmol/L (ref 3.5–5.1)
Sodium: 138 mmol/L (ref 135–145)

## 2020-09-25 LAB — RESP PANEL BY RT-PCR (FLU A&B, COVID) ARPGX2
Influenza A by PCR: NEGATIVE
Influenza B by PCR: NEGATIVE
SARS Coronavirus 2 by RT PCR: NEGATIVE

## 2020-09-25 LAB — PROTIME-INR
INR: 1 (ref 0.8–1.2)
Prothrombin Time: 12.7 seconds (ref 11.4–15.2)

## 2020-09-25 MED ORDER — MORPHINE SULFATE (PF) 4 MG/ML IV SOLN
4.0000 mg | Freq: Once | INTRAVENOUS | Status: AC
  Administered 2020-09-25: 4 mg via INTRAVENOUS
  Filled 2020-09-25: qty 1

## 2020-09-25 MED ORDER — TETANUS-DIPHTH-ACELL PERTUSSIS 5-2.5-18.5 LF-MCG/0.5 IM SUSY
0.5000 mL | PREFILLED_SYRINGE | Freq: Once | INTRAMUSCULAR | Status: AC
  Administered 2020-09-25: 0.5 mL via INTRAMUSCULAR
  Filled 2020-09-25: qty 0.5

## 2020-09-25 MED ORDER — DROPERIDOL 2.5 MG/ML IJ SOLN
5.0000 mg | Freq: Once | INTRAMUSCULAR | Status: AC
  Administered 2020-09-25: 5 mg via INTRAVENOUS
  Filled 2020-09-25: qty 2

## 2020-09-25 NOTE — ED Notes (Signed)
Entered room to find pt now quiet with eyes closed; RR even and unlabored on RA with symmetrical rise and fall of chest - prepared for lab draw from saline lock and noted that saline lock dislodged with cath tip intact
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
## 2020-09-25 NOTE — ED Notes (Signed)
Attempted to call significant other Lowella Bandy [phone]) however no answer and unable to leave voicemail due to voicemail being full
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
## 2020-09-25 NOTE — ED Provider Notes (Addendum)
Ultrasound ED Peripheral IV (Provider)  Date/Time: 09/25/2020 8:07 AM Performed by: Shaune Pollack, MD Authorized by: Shaune Pollack, MD   Procedure details:    Indications: multiple failed IV attempts     Skin Prep: chlorhexidine gluconate     Location:  Right forearm   Angiocath:  20 G   Bedside Ultrasound Guided: Yes     Images: archived     Patient tolerated procedure without complications: Yes     Dressing applied: Yes        Shaune Pollack, MD 09/25/20 5053    Shaune Pollack, MD 09/25/20 [phone]
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
## 2020-09-25 NOTE — ED Notes (Signed)
Pt has had a large dose of pain meds and is unable to sign for transfer. Alert to move to [facility] stretcher, but unable to sign.

## 2020-09-25 NOTE — ED Notes (Signed)
[facility]  WILL  TRANSPORT PT  TO  [facility]
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
## 2021-02-17 ENCOUNTER — Other Ambulatory Visit: Payer: Self-pay

## 2021-02-17 ENCOUNTER — Emergency Department
Admission: EM | Admit: 2021-02-17 | Discharge: 2021-02-17 | Disposition: A | Discharge status: Home / Self Care | Payer: No Typology Code available for payment source | Attending: Emergency Medicine | Admitting: Emergency Medicine

## 2021-02-17 DIAGNOSIS — Z20822 Contact with and (suspected) exposure to covid-19: Secondary | ICD-10-CM | POA: Insufficient documentation

## 2021-02-17 DIAGNOSIS — F1914 Other psychoactive substance abuse with psychoactive substance-induced mood disorder: Secondary | ICD-10-CM | POA: Insufficient documentation

## 2021-02-17 DIAGNOSIS — F1510 Other stimulant abuse, uncomplicated: Secondary | ICD-10-CM | POA: Diagnosis not present

## 2021-02-17 DIAGNOSIS — F600 Paranoid personality disorder: Secondary | ICD-10-CM | POA: Diagnosis not present

## 2021-02-17 DIAGNOSIS — F3162 Bipolar disorder, current episode mixed, moderate: Secondary | ICD-10-CM | POA: Diagnosis not present

## 2021-02-17 DIAGNOSIS — F17210 Nicotine dependence, cigarettes, uncomplicated: Secondary | ICD-10-CM | POA: Insufficient documentation

## 2021-02-17 DIAGNOSIS — R519 Headache, unspecified: Secondary | ICD-10-CM | POA: Diagnosis not present

## 2021-02-17 DIAGNOSIS — F332 Major depressive disorder, recurrent severe without psychotic features: Secondary | ICD-10-CM | POA: Diagnosis not present

## 2021-02-17 DIAGNOSIS — F15950 Other stimulant use, unspecified with stimulant-induced psychotic disorder with delusions: Secondary | ICD-10-CM

## 2021-02-17 DIAGNOSIS — F1994 Other psychoactive substance use, unspecified with psychoactive substance-induced mood disorder: Secondary | ICD-10-CM | POA: Diagnosis present

## 2021-02-17 DIAGNOSIS — F22 Delusional disorders: Secondary | ICD-10-CM

## 2021-02-17 LAB — URINALYSIS, COMPLETE (UACMP) WITH MICROSCOPIC
Bacteria, UA: NONE SEEN
Bilirubin Urine: NEGATIVE
Glucose, UA: NEGATIVE mg/dL
Ketones, ur: NEGATIVE mg/dL
Leukocytes,Ua: NEGATIVE
Nitrite: NEGATIVE
Protein, ur: NEGATIVE mg/dL
Specific Gravity, Urine: 1.024 (ref 1.005–1.030)
Squamous Epithelial / HPF: NONE SEEN (ref 0–5)
pH: 5 (ref 5.0–8.0)

## 2021-02-17 LAB — URINE DRUG SCREEN, QUALITATIVE (ARMC ONLY)
Amphetamines, Ur Screen: POSITIVE — AB
Barbiturates, Ur Screen: NOT DETECTED
Benzodiazepine, Ur Scrn: NOT DETECTED
Cannabinoid 50 Ng, Ur ~~LOC~~: POSITIVE — AB
Cocaine Metabolite,Ur ~~LOC~~: NOT DETECTED
MDMA (Ecstasy)Ur Screen: NOT DETECTED
Methadone Scn, Ur: NOT DETECTED
Opiate, Ur Screen: NOT DETECTED
Phencyclidine (PCP) Ur S: NOT DETECTED
Tricyclic, Ur Screen: NOT DETECTED

## 2021-02-17 LAB — CBC
HCT: 43.1 % (ref 39.0–52.0)
Hemoglobin: 14.6 g/dL (ref 13.0–17.0)
MCH: 28.5 pg (ref 26.0–34.0)
MCHC: 33.9 g/dL (ref 30.0–36.0)
MCV: 84 fL (ref 80.0–100.0)
Platelets: 163 10*3/uL (ref 150–400)
RBC: 5.13 MIL/uL (ref 4.22–5.81)
RDW: 13 % (ref 11.5–15.5)
WBC: 9.1 10*3/uL (ref 4.0–10.5)
nRBC: 0 % (ref 0.0–0.2)

## 2021-02-17 LAB — COMPREHENSIVE METABOLIC PANEL
ALT: 37 U/L (ref 0–44)
AST: 32 U/L (ref 15–41)
Albumin: 4.3 g/dL (ref 3.5–5.0)
Alkaline Phosphatase: 59 U/L (ref 38–126)
Anion gap: 7 (ref 5–15)
BUN: 17 mg/dL (ref 6–20)
CO2: 27 mmol/L (ref 22–32)
Calcium: 9.1 mg/dL (ref 8.9–10.3)
Chloride: 103 mmol/L (ref 98–111)
Creatinine, Ser: 0.89 mg/dL (ref 0.61–1.24)
GFR, Estimated: >60 mL/min (ref >60)
Glucose, Bld: 155 mg/dL — ABNORMAL HIGH (ref 70–99)
Potassium: 4 mmol/L (ref 3.5–5.1)
Sodium: 137 mmol/L (ref 135–145)
Total Bilirubin: 0.6 mg/dL (ref 0.3–1.2)
Total Protein: 8.2 g/dL — ABNORMAL HIGH (ref 6.5–8.1)

## 2021-02-17 LAB — ETHANOL: Alcohol, Ethyl (B): <10 mg/dL (ref <10)

## 2021-02-17 LAB — RESP PANEL BY RT-PCR (FLU A&B, COVID) ARPGX2
Influenza A by PCR: NEGATIVE
Influenza B by PCR: NEGATIVE
SARS Coronavirus 2 by RT PCR: NEGATIVE

## 2021-02-17 MED ORDER — ACETAMINOPHEN 500 MG PO TABS
1000.0000 mg | ORAL_TABLET | Freq: Once | ORAL | Status: AC
  Administered 2021-02-17: 1000 mg via ORAL
  Filled 2021-02-17: qty 2

## 2021-02-17 NOTE — ED Notes (Signed)
E-signature not working at this time. Pt verbalized understanding of D/C instructions, prescriptions and follow up care with no further questions at this time. Pt in NAD and ambulatory at time of D/C.  
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
## 2021-02-17 NOTE — ED Notes (Signed)
Up to bathroom and back in bed.
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
## 2021-02-17 NOTE — Consult Note (Signed)
[facility] Consult   Reason for Consult: Consult for this 40 year old man with a history of substance abuse who came voluntarily to the hospital with what appears to be meth induced paranoia Referring Physician: Tamala Julian Patient Identification: Randy Woods. MRN:  [id number] Principal Diagnosis: Amphetamine and psychostimulant-induced psychosis with delusions ([place]) Diagnosis:  Principal Problem:   Amphetamine and psychostimulant-induced psychosis with delusions ([place]) Active Problems:   Substance induced mood disorder (HCC)   Amphetamine abuse ([place])   Total Time spent with patient: 1 hour  Subjective:   Randy Woods. is a 40 y.o. male patient admitted with "I just wanted to get away from those people".  HPI: Patient seen chart reviewed.  40 year old man known to the hospital and psychiatry service for a long history of substance abuse problems was brought voluntarily last night.  Apparently he called 911 because he became frightened at his mother's house.  Patient tries to tell me the story just as it looks like he did to other staff last night.  The story is very confusing and hard to follow.  As far as I can tell he just got out of jail several days ago and tried to go back to his mother's house to live.  His mother does not live there and it is rented out.  He went back there and discovered that other people were staying there.  Things get even more confusing at this point.  He seems to believe that they are threatening to kill him and he needed to get away from them.  Patient admits that he used methamphetamine yesterday.  Has a hard time explaining just how much of it or how frequently he is used any lately.  He is very definite however that he has not used any narcotics since getting out of jail and is pleased with himself for that.  Not taking any psychiatric medicine.  Patient has completely denied any suicidal or homicidal thoughts.  Denies any wish to fight with or  get violent with her harm the people he believes are at his mother's house.  Since being here in the emergency room his behavior has been generally calm.  The story has been consistent although confusing but he does not seem to be actively hallucinating.  Past Psychiatric History: Past history of substance abuse with a long history of opiate abuse.  Some sobriety intermittently often with the use of Suboxone.  Recent uptick and other substance abuse like stimulants.  The question of whether he has another underlying mental health problem seems unclear given that substances seem inevitably to be involved with his presentation.  No history however known of any actual suicide attempts or violence  Risk to Self:   Risk to Others:   Prior Inpatient Therapy:   Prior Outpatient Therapy:    Past Medical History:  Past Medical History:  Diagnosis Date  . Anxiety   . Bipolar 1 disorder ([place])   . Bronchitis   . Cocaine abuse ([place])   . Depression   . Drug abuse ([place])   . Heroin abuse ([place])   . Schizophrenia [facility])     Past Surgical History:  Procedure Laterality Date  . NOSE SURGERY    . TEE WITHOUT CARDIOVERSION N/A 05/10/2016   Procedure: TRANSESOPHAGEAL ECHOCARDIOGRAM (TEE);  Surgeon: Dionisio David, MD;  Location: [facility] ORS;  Service: Cardiovascular;  Laterality: N/A;  . TYMPANOPLASTY     Family History:  Family History  Problem Relation Age of Onset  .  Diabetes Mother   . Hypertension Father   . Kidney failure Father   . Diabetes Father    Family Psychiatric  History: None reported Social History:  Social History   Substance and Sexual Activity  Alcohol Use Yes     Social History   Substance and Sexual Activity  Drug Use Not Currently  . Types: IV, Marijuana   Comment: heroine    Social History   Socioeconomic History  . Marital status: Married    Spouse name: Not on file  . Number of children: Not on file  . Years of education: Not on file  . Highest education level:  Not on file  Occupational History  . Not on file  Tobacco Use  . Smoking status: Current Every Day Smoker    Packs/day: 1.50    Types: Cigarettes  . Smokeless tobacco: Current User    Types: Snuff  Vaping Use  . Vaping Use: Never used  Substance and Sexual Activity  . Alcohol use: Yes  . Drug use: Not Currently    Types: IV, Marijuana    Comment: heroine  . Sexual activity: Not on file  Other Topics Concern  . Not on file  Social History Narrative  . Not on file   Social Determinants of Health   Financial Resource Strain: Not on file  Food Insecurity: Not on file  Transportation Needs: Not on file  Physical Activity: Not on file  Stress: Not on file  Social Connections: Not on file   Additional Social History:    Allergies:   Allergies  Allergen Reactions  . Bactrim [Sulfamethoxazole-Trimethoprim]   . Sulfa Antibiotics Other (See Comments) and Rash    Pt states "the skin peels off of my penis" Rash to penis    Labs:  Results for orders placed or performed during the hospital encounter of 02/17/21 (from the past 48 hour(s))  CBC     Status: None   Collection Time: 02/17/21  2:37 AM  Result Value Ref Range   WBC 9.1 4.0 - 10.5 K/uL   RBC 5.13 4.22 - 5.81 MIL/uL   Hemoglobin 14.6 13.0 - 17.0 g/dL   HCT 43.1 39.0 - 52.0 %   MCV 84.0 80.0 - 100.0 fL   MCH 28.5 26.0 - 34.0 pg   MCHC 33.9 30.0 - 36.0 g/dL   RDW 13.0 11.5 - 15.5 %   Platelets 163 150 - 400 K/uL   nRBC 0.0 0.0 - 0.2 %    Comment: Performed at [facility], [address] [place], [place] [address]  Comprehensive metabolic panel     Status: Abnormal   Collection Time: 02/17/21  2:37 AM  Result Value Ref Range   Sodium 137 135 - 145 mmol/L   Potassium 4.0 3.5 - 5.1 mmol/L   Chloride 103 98 - 111 mmol/L   CO2 27 22 - 32 mmol/L   Glucose, Bld 155 (H) 70 - 99 mg/dL    Comment: Glucose reference range applies only to samples taken after fasting for at least 8 hours.   BUN 17 6 - 20  mg/dL   Creatinine, Ser 0.89 0.61 - 1.24 mg/dL   Calcium 9.1 8.9 - 10.3 mg/dL   Total Protein 8.2 (H) 6.5 - 8.1 g/dL   Albumin 4.3 3.5 - 5.0 g/dL   AST 32 15 - 41 U/L   ALT 37 0 - 44 U/L   Alkaline Phosphatase 59 38 - 126 U/L   Total Bilirubin 0.6  0.3 - 1.2 mg/dL   GFR, Estimated >60 >60 mL/min    Comment: (NOTE) Calculated using the CKD-EPI Creatinine Equation (2021)    Anion gap 7 5 - 15    Comment: Performed at [facility], [address] [place], [place] [address]  Ethanol     Status: None   Collection Time: 02/17/21  2:37 AM  Result Value Ref Range   Alcohol, Ethyl (B) <10 <10 mg/dL    Comment: (NOTE) Lowest detectable limit for serum alcohol is 10 mg/dL.  For medical purposes only. Performed at [facility], [address] [place], [place] [address]   Urinalysis, Complete w Microscopic     Status: Abnormal   Collection Time: 02/17/21  3:50 AM  Result Value Ref Range   Color, Urine YELLOW (A) YELLOW   APPearance CLEAR (A) CLEAR   Specific Gravity, Urine 1.024 1.005 - 1.030   pH 5.0 5.0 - 8.0   Glucose, UA NEGATIVE NEGATIVE mg/dL   Hgb urine dipstick MODERATE (A) NEGATIVE   Bilirubin Urine NEGATIVE NEGATIVE   Ketones, ur NEGATIVE NEGATIVE mg/dL   Protein, ur NEGATIVE NEGATIVE mg/dL   Nitrite NEGATIVE NEGATIVE   Leukocytes,Ua NEGATIVE NEGATIVE   RBC / HPF 11-20 0 - 5 RBC/hpf   WBC, UA 0-5 0 - 5 WBC/hpf   Bacteria, UA NONE SEEN NONE SEEN   Squamous Epithelial / LPF NONE SEEN 0 - 5   Mucus PRESENT     Comment: Performed at [facility], [address] [place], [place] [address]  Urine Drug Screen, Qualitative ([facility] only)     Status: Abnormal   Collection Time: 02/17/21  3:50 AM  Result Value Ref Range   Tricyclic, Ur Screen NONE DETECTED NONE DETECTED   Amphetamines, Ur Screen POSITIVE (A) NONE DETECTED   MDMA (Ecstasy)Ur Screen NONE DETECTED NONE DETECTED   Cocaine Metabolite,Ur [place] NONE DETECTED NONE DETECTED   Opiate, Ur Screen  NONE DETECTED NONE DETECTED   Phencyclidine (PCP) Ur S NONE DETECTED NONE DETECTED   Cannabinoid 50 Ng, Ur [place] POSITIVE (A) NONE DETECTED   Barbiturates, Ur Screen NONE DETECTED NONE DETECTED   Benzodiazepine, Ur Scrn NONE DETECTED NONE DETECTED   Methadone Scn, Ur NONE DETECTED NONE DETECTED    Comment: (NOTE) Tricyclics + metabolites, urine    Cutoff 1000 ng/mL Amphetamines + metabolites, urine  Cutoff 1000 ng/mL MDMA (Ecstasy), urine              Cutoff 500 ng/mL Cocaine Metabolite, urine          Cutoff 300 ng/mL Opiate + metabolites, urine        Cutoff 300 ng/mL Phencyclidine (PCP), urine         Cutoff 25 ng/mL Cannabinoid, urine                 Cutoff 50 ng/mL Barbiturates + metabolites, urine  Cutoff 200 ng/mL Benzodiazepine, urine              Cutoff 200 ng/mL Methadone, urine                   Cutoff 300 ng/mL  The urine drug screen provides only a preliminary, unconfirmed analytical test result and should not be used for non-medical purposes. Clinical consideration and professional judgment should be applied to any positive drug screen result due to possible interfering substances. A more specific alternate chemical method must be used in order to obtain a confirmed analytical result. Gas chromatography /  mass spectrometry (GC/MS) is the preferred confirm atory method. Performed at [facility], [address] [place], [place] [address]   Resp Panel by RT-PCR (Flu A&B, Covid) Nasopharyngeal Swab     Status: None   Collection Time: 02/17/21  6:38 AM   Specimen: Nasopharyngeal Swab; Nasopharyngeal(NP) swabs in vial transport medium  Result Value Ref Range   SARS Coronavirus 2 by RT PCR NEGATIVE NEGATIVE    Comment: (NOTE) SARS-CoV-2 target nucleic acids are NOT DETECTED.  The SARS-CoV-2 RNA is generally detectable in upper respiratory specimens during the acute phase of infection. The lowest concentration of SARS-CoV-2 viral copies this assay can detect  is 138 copies/mL. A negative result does not preclude SARS-Cov-2 infection and should not be used as the sole basis for treatment or other patient management decisions. A negative result may occur with  improper specimen collection/handling, submission of specimen other than nasopharyngeal swab, presence of viral mutation(s) within the areas targeted by this assay, and inadequate number of viral copies(<138 copies/mL). A negative result must be combined with clinical observations, patient history, and epidemiological information. The expected result is Negative.  Fact Sheet for Patients:  EntrepreneurPulse.com.au  Fact Sheet for Healthcare Providers:  IncredibleEmployment.be  This test is no t yet approved or cleared by the [place] FDA and  has been authorized for detection and/or diagnosis of SARS-CoV-2 by FDA under an Emergency Use Authorization (EUA). This EUA will remain  in effect (meaning this test can be used) for the duration of the COVID-19 declaration under Section 564(b)(1) of the Act, 21 U.S.C.section 360bbb-3(b)(1), unless the authorization is terminated  or revoked sooner.       Influenza A by PCR NEGATIVE NEGATIVE   Influenza B by PCR NEGATIVE NEGATIVE    Comment: (NOTE) The Xpert Xpress SARS-CoV-2/FLU/RSV plus assay is intended as an aid in the diagnosis of influenza from Nasopharyngeal swab specimens and should not be used as a sole basis for treatment. Nasal washings and aspirates are unacceptable for Xpert Xpress SARS-CoV-2/FLU/RSV testing.  Fact Sheet for Patients: EntrepreneurPulse.com.au  Fact Sheet for Healthcare Providers: IncredibleEmployment.be  This test is not yet approved or cleared by the [place] FDA and has been authorized for detection and/or diagnosis of SARS-CoV-2 by FDA under an Emergency Use Authorization (EUA). This EUA will remain in effect (meaning this  test can be used) for the duration of the COVID-19 declaration under Section 564(b)(1) of the Act, 21 U.S.C. section 360bbb-3(b)(1), unless the authorization is terminated or revoked.  Performed at [facility], [address] [place], [place] [address]     No current facility-administered medications for this encounter.   Current Outpatient Medications  Medication Sig Dispense Refill  . amphetamine-dextroamphetamine (ADDERALL) 30 MG tablet Take 30 mg by mouth 2 (two) times daily.   0  . Buprenorphine HCl-Naloxone HCl (SUBOXONE) 8-2 MG FILM Place 1 Film under the tongue 3 (three) times daily.     . clonazePAM (KLONOPIN) 1 MG tablet Take 2 mg by mouth at bedtime.   0  . gabapentin (NEURONTIN) 400 MG capsule Take 400 mg by mouth 3 (three) times daily.    Marland Kitchen lurasidone (LATUDA) 40 MG TABS tablet Take 40 mg by mouth daily with breakfast.    . naloxone (NARCAN) nasal spray 4 mg/0.1 mL Keep accessible in case of overdose. 1 kit 0    Musculoskeletal: Strength & Muscle Tone: within normal limits Gait & Station: normal Patient leans: N/A  Psychiatric Specialty Exam:  Presentation  General Appearance: Casual  Eye Contact:Good  Speech:Pressured  Speech Volume:Increased  Handedness:Right   Mood and Affect  Mood:Anxious; Euphoric  Affect:Inappropriate; Full Range   Thought Process  Thought Processes:Coherent  Descriptions of Associations:Tangential  Orientation:Full (Time, Place and Person)  Thought Content:Abstract Reasoning; Delusions; Illusions; Paranoid Ideation; Tangential  History of Schizophrenia/Schizoaffective disorder:No  Duration of Psychotic Symptoms:No data recorded Hallucinations:Hallucinations: None  Ideas of Reference:Paranoia  Suicidal Thoughts:Suicidal Thoughts: No  Homicidal Thoughts:Homicidal Thoughts: No   Sensorium  Memory:Immediate Fair; Recent Fair; Remote Fair  Judgment:Poor  Insight:Poor   Executive  Functions  Concentration:Poor  Attention Span:Poor  Recall:Poor  Fund of Knowledge:Poor  Language:Poor   Psychomotor Activity  Psychomotor Activity:Psychomotor Activity: Normal   Assets  Assets:Desire for Improvement; Financial Resources/Insurance   Sleep  Sleep:Sleep: Poor   Physical Exam: Physical Exam Vitals and nursing note reviewed.  Constitutional:      Appearance: Normal appearance.  HENT:     Head: Normocephalic and atraumatic.     Mouth/Throat:     Pharynx: Oropharynx is clear.  Eyes:     Pupils: Pupils are equal, round, and reactive to light.  Cardiovascular:     Rate and Rhythm: Normal rate and regular rhythm.  Pulmonary:     Effort: Pulmonary effort is normal.     Breath sounds: Normal breath sounds.  Abdominal:     General: Abdomen is flat.     Palpations: Abdomen is soft.  Musculoskeletal:        General: Normal range of motion.  Skin:    General: Skin is warm and dry.  Neurological:     General: No focal deficit present.     Mental Status: He is alert. Mental status is at baseline.  Psychiatric:        Attention and Perception: He is inattentive.        Mood and Affect: Mood is anxious.        Speech: Speech is rapid and pressured.        Behavior: Behavior is agitated. Behavior is not aggressive.        Thought Content: Thought content is paranoid. Thought content does not include homicidal or suicidal ideation.        Cognition and Memory: Memory is impaired.        Judgment: Judgment is impulsive.    Review of Systems  Constitutional: Negative.   HENT: Negative.   Eyes: Negative.   Respiratory: Negative.   Cardiovascular: Negative.   Gastrointestinal: Negative.   Musculoskeletal: Negative.   Skin: Negative.   Neurological: Negative.   Psychiatric/Behavioral: Positive for substance abuse. Negative for depression, hallucinations, memory loss and suicidal ideas. The patient is nervous/anxious. The patient does not have insomnia.     Blood pressure (!) 167/118, pulse (!) 120, temperature 99.2 F (37.3 C), temperature source Oral, resp. rate 20, height '5\' 9"'  (1.753 m), weight 108.9 kg, SpO2 97 %. Body mass index is 35.44 kg/m.  Treatment Plan Summary: Plan 40 year old man with substance abuse problems a drug screen positive for methamphetamines and cannabis had a story of it sounds extremely typical for the kind of paranoia induced by methamphetamine believing that some nefarious group of people are chasing you are out to get you.  Patient is absolutely adamant that he believes it and not open to any discussion of that.  However he also does not appear to be any acute danger to himself or anyone else.  He is voluntarily now and  does not meet commitment criteria.  I offered him to consider the option of voluntary admission to the psychiatric ward to arrest up but he declines that saying he does not think it would be of any benefit.  At this point therefore I have strongly encouraged him to stay away from all drug use and to follow up with Dr. Kasandra Knudsen with whom he claims he has an appointment this week.  Otherwise no further treatment offered in the emergency room.  I do not see an indication for prescribing antipsychotics.  If this is meth induced it should clear up spontaneously soon and right now he seems to be at least basically able to care for himself.  Disposition: No evidence of imminent risk to self or others at present.   Patient does not meet criteria for psychiatric inpatient admission. Supportive therapy provided about ongoing stressors. Discussed crisis plan, support from social network, calling 911, coming to the Emergency Department, and calling Suicide Hotline.  Alethia Berthold, MD 02/17/2021 12:43 PM
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
## 2021-02-17 NOTE — ED Notes (Addendum)
Wallace Cullens shorts Black tshirt Socks  Credit card [employer] All locked in [employer] area

## 2021-02-17 NOTE — ED Notes (Signed)
Pt given breakfast tray

## 2021-02-17 NOTE — ED Triage Notes (Signed)
Pt here for paranoid behavior after using meth. Pt has also been arguing with people at the house and feels like they may hurt him if he goes back.

## 2021-02-17 NOTE — ED Notes (Signed)
Pt is up using the restroom this morning. He is back in his room watching TV with no issues.

## 2021-02-17 NOTE — ED Provider Notes (Signed)
Patient seen by Dr. Toni Amend and discharge.  Discharged stable condition.  He will follow-up with his psychiatrist.   Gilles Chiquito, MD 02/17/21 1254

## 2021-02-23 ENCOUNTER — Emergency Department
Admission: EM | Admit: 2021-02-23 | Discharge: 2021-02-24 | Disposition: A | Discharge status: Home / Self Care | Payer: No Typology Code available for payment source | Attending: Emergency Medicine | Admitting: Emergency Medicine

## 2021-02-23 ENCOUNTER — Other Ambulatory Visit: Payer: Self-pay

## 2021-02-23 DIAGNOSIS — F17210 Nicotine dependence, cigarettes, uncomplicated: Secondary | ICD-10-CM | POA: Diagnosis not present

## 2021-02-23 DIAGNOSIS — F1994 Other psychoactive substance use, unspecified with psychoactive substance-induced mood disorder: Secondary | ICD-10-CM | POA: Diagnosis not present

## 2021-02-23 DIAGNOSIS — F1510 Other stimulant abuse, uncomplicated: Secondary | ICD-10-CM | POA: Diagnosis present

## 2021-02-23 DIAGNOSIS — Z20822 Contact with and (suspected) exposure to covid-19: Secondary | ICD-10-CM | POA: Diagnosis not present

## 2021-02-23 DIAGNOSIS — F1910 Other psychoactive substance abuse, uncomplicated: Secondary | ICD-10-CM

## 2021-02-23 DIAGNOSIS — F15950 Other stimulant use, unspecified with stimulant-induced psychotic disorder with delusions: Secondary | ICD-10-CM | POA: Diagnosis present

## 2021-02-23 DIAGNOSIS — F1914 Other psychoactive substance abuse with psychoactive substance-induced mood disorder: Secondary | ICD-10-CM | POA: Insufficient documentation

## 2021-02-23 DIAGNOSIS — F22 Delusional disorders: Secondary | ICD-10-CM | POA: Insufficient documentation

## 2021-02-23 DIAGNOSIS — F29 Unspecified psychosis not due to a substance or known physiological condition: Secondary | ICD-10-CM

## 2021-02-23 LAB — COMPREHENSIVE METABOLIC PANEL
ALT: 35 U/L (ref 0–44)
AST: 33 U/L (ref 15–41)
Albumin: 3.9 g/dL (ref 3.5–5.0)
Alkaline Phosphatase: 47 U/L (ref 38–126)
Anion gap: 12 (ref 5–15)
BUN: 12 mg/dL (ref 6–20)
CO2: 24 mmol/L (ref 22–32)
Calcium: 8.5 mg/dL — ABNORMAL LOW (ref 8.9–10.3)
Chloride: 106 mmol/L (ref 98–111)
Creatinine, Ser: 0.71 mg/dL (ref 0.61–1.24)
GFR, Estimated: >60 mL/min (ref >60)
Glucose, Bld: 120 mg/dL — ABNORMAL HIGH (ref 70–99)
Potassium: 3.6 mmol/L (ref 3.5–5.1)
Sodium: 142 mmol/L (ref 135–145)
Total Bilirubin: 0.5 mg/dL (ref 0.3–1.2)
Total Protein: 7.5 g/dL (ref 6.5–8.1)

## 2021-02-23 LAB — ETHANOL: Alcohol, Ethyl (B): <10 mg/dL (ref <10)

## 2021-02-23 LAB — CBC
HCT: 42.3 % (ref 39.0–52.0)
Hemoglobin: 14.3 g/dL (ref 13.0–17.0)
MCH: 28.9 pg (ref 26.0–34.0)
MCHC: 33.8 g/dL (ref 30.0–36.0)
MCV: 85.6 fL (ref 80.0–100.0)
Platelets: 156 10*3/uL (ref 150–400)
RBC: 4.94 MIL/uL (ref 4.22–5.81)
RDW: 13.1 % (ref 11.5–15.5)
WBC: 6.9 10*3/uL (ref 4.0–10.5)
nRBC: 0 % (ref 0.0–0.2)

## 2021-02-23 LAB — RESP PANEL BY RT-PCR (FLU A&B, COVID) ARPGX2
Influenza A by PCR: NEGATIVE
Influenza B by PCR: NEGATIVE
SARS Coronavirus 2 by RT PCR: NEGATIVE

## 2021-02-23 NOTE — ED Triage Notes (Signed)
Pt here for drug detox and wants treatment for paranoia. Has been here recently for the same.

## 2021-02-23 NOTE — ED Provider Notes (Signed)
[facility] Emergency Department Provider Note  Time seen: 9:26 PM  I have reviewed the triage vital signs and the nursing notes.   HISTORY  Chief Complaint Psychiatric Evaluation   HPI Randy Woods. is a 40 y.o. male with a past medical history of anxiety, bipolar, substance abuse, presents to the emergency department for paranoia.  According to the patient has a history of paranoia, admits to amphetamine use tonight which admittedly is likely the cause of the patient's paranoia per patient.  Patient has no medical complaints today.  Negative review of systems.  No SI or HI.   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   Facial cellulitis 07/22/2020   Opiate abuse, episodic (HCC) 02/07/2017   Substance induced mood disorder (HCC) 02/07/2017   Opiate overdose (HCC) 11/12/2016   Bipolar 1 disorder, mixed, moderate (HCC) 05/07/2016   Staphylococcal sepsis (HCC) 05/06/2016   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 Authorizing Provider  amphetamine-dextroamphetamine (ADDERALL) 30 MG tablet Take 30 mg by mouth 2 (two) times daily.     [provider]   Buprenorphine HCl-Naloxone HCl (SUBOXONE) 8-2 MG FILM Place 1 Film under the tongue 3 (three) times daily.     [provider]  clonazePAM (KLONOPIN) 1 MG tablet Take 2 mg by mouth at bedtime.     [provider]  gabapentin (NEURONTIN) 400 MG capsule Take 400 mg by mouth 3 (three) times daily.    [provider]  lurasidone (LATUDA) 40 MG TABS tablet Take 40 mg by mouth daily with breakfast.    [provider]  naloxone (NARCAN) nasal spray 4 mg/0.1 mL Keep accessible in case of overdose. 01/24/19   Sharman Cheek, MD    Allergies  Allergen Reactions   Bactrim [Sulfamethoxazole-Trimethoprim]    Sulfa Antibiotics Other (See Comments) and Rash    Pt states "the skin peels off of my penis" Rash to penis    Family History  Problem Relation Age of Onset   Diabetes Mother    Hypertension Father    Kidney failure Father    Diabetes Father     Social History Social History   Tobacco Use   Smoking status: Every Day    Packs/day: 1.50    Pack years: 0.00    Types: Cigarettes   Smokeless tobacco: Current    Types: Snuff  Vaping Use   Vaping Use: Never used  Substance Use Topics   Alcohol use: Yes   Drug use: Not Currently    Types: IV, Marijuana    Comment: heroine    Review of Systems Constitutional:  Negative for fever. Cardiovascular: Negative for chest pain. Respiratory: Negative for shortness of breath. Gastrointestinal: Negative for abdominal pain, vomiting  Musculoskeletal: Negative for musculoskeletal complaint Neurological: Negative for headache All other ROS negative  ____________________________________________   PHYSICAL EXAM:  VITAL SIGNS: ED Triage Vitals  Enc Vitals Group     BP 02/23/21 2051 137/89     Pulse Rate 02/23/21 2051 (!) 114     Resp 02/23/21 2051 20     Temp 02/23/21 2051 98.7 F (37.1 C)     Temp Source 02/23/21 2051 Oral     SpO2 02/23/21 2051 96 %     Weight 02/23/21 2053 235 lb (106.6 kg)      Height 02/23/21 2053 5\' 8"  (1.727 m)     Head Circumference --      Peak Flow --      Pain Score 02/23/21 2053 0     Pain Loc --      Pain Edu? --      Excl. in GC? --    Constitutional: Awake alert, no acute distress.  Does appear to be somewhat intoxicated. Eyes: Normal exam ENT      Head: Normocephalic and atraumatic.      Mouth/Throat: Mucous membranes are moist. Cardiovascular: Normal rate, regular rhythm.  Respiratory: Normal respiratory effort without tachypnea nor retractions. Breath sounds are clear Gastrointestinal: Soft and nontender. No distention.  Musculoskeletal: Nontender with normal range of motion in all extremities.  Neurologic: Slightly slurred speech.  No gross focal neurologic deficits Skin:  Skin is warm, dry and intact.  Psychiatric: States paranoia but appears calm cooperative.  ____________________________________________   INITIAL IMPRESSION / ASSESSMENT AND PLAN / ED COURSE  Pertinent labs & imaging results that were available during my care of the patient were reviewed by me and considered in my medical decision making (see chart for details).   Patient presents emergency department for psychiatric evaluation.  States he is feeling very paranoid but also admits to using methamphetamines tonight.  We will check labs, have psychiatry evaluate.  No SI or HI does not appear to meet IVC criteria.  Randy Woods 2055. was evaluated in Emergency Department on 02/23/2021 for the symptoms described in the history of present illness. He was evaluated in the context of the global COVID-19 pandemic, which necessitated consideration that the patient might be at risk for infection with the SARS-CoV-2 virus that causes COVID-19. Institutional protocols and algorithms that pertain to the evaluation of patients at risk for COVID-19 are in a state of rapid change based on information released by regulatory bodies including the CDC and federal and state organizations. These  policies and algorithms were followed during the patient's care in the ED.  The patient has been placed in psychiatric observation due to the need to provide a safe environment for the patient while obtaining psychiatric consultation and evaluation, as well as ongoing medical and medication management to treat the patient's condition.  The patient has not been placed under full IVC at this time.  ____________________________________________   FINAL CLINICAL IMPRESSION(S) / ED DIAGNOSES  Paranoia Substance abuse   02/25/2021, MD 02/23/21 2131

## 2021-02-23 NOTE — ED Notes (Addendum)
Pt changed into burgundy scrubs and following items locked in BHU locker area: Gabapentin bottle Phone Lighter Flip flops Yellow tshirt Black shorts boxers

## 2021-02-23 NOTE — ED Notes (Signed)
Resumed care from emily rn   Pt brought from beh quad in a wheelchair.  Pt drowsy and nonverbal at this time.  Will continue to monitor pt.   Pt lying on bed sleeping.

## 2021-02-24 DIAGNOSIS — F1994 Other psychoactive substance use, unspecified with psychoactive substance-induced mood disorder: Secondary | ICD-10-CM

## 2021-02-24 MED ORDER — LURASIDONE HCL 40 MG PO TABS
40.0000 mg | ORAL_TABLET | Freq: Once | ORAL | Status: AC
  Administered 2021-02-24: 40 mg via ORAL
  Filled 2021-02-24: qty 1

## 2021-02-24 MED ORDER — BUPRENORPHINE HCL-NALOXONE HCL 8-2 MG SL SUBL
1.0000 | SUBLINGUAL_TABLET | Freq: Once | SUBLINGUAL | Status: AC
  Administered 2021-02-24: 1 via SUBLINGUAL
  Filled 2021-02-24: qty 1

## 2021-02-24 NOTE — ED Notes (Signed)
Pt noted to be resting in bed at this time. No complaints or concerns voiced at this time. Pt in NAD.

## 2021-02-24 NOTE — Consult Note (Signed)
[facility] Face-to-Face Psychiatry Consult   Reason for Consult: Consult for this 40 year old man known to the emergency room with a history of substance abuse and mood disorder Referring Physician: Bland Span Patient Identification: Randy Pons. MRN:  [id number] Principal Diagnosis: Substance induced mood disorder (HCC) Diagnosis:  Principal Problem:   Substance induced mood disorder (HCC) Active Problems:   Amphetamine and psychostimulant-induced psychosis with delusions ([place])   Amphetamine abuse ([place])   Total Time spent with patient: 1 hour  Subjective:   Randy Pons. is a 40 y.o. male patient admitted with "I just need my pain medicine".  HPI: Patient seen chart reviewed.  40 year old man with a history of polysubstance abuse including multiple drugs amphetamines alcohol and opiates.  Came into the hospital last night saying he was feeling paranoid like people were out to get him.  He admitted at the time that it could be because of methamphetamine use.  Patient was stating this morning that he was no longer feeling paranoid.  Denied auditory or visual hallucinations.  Denied any suicidal thinking.  Admitted that he had been using methamphetamine recently.  Denied that he is currently using any other drugs.  Says he is still compliant with prescribed outpatient medication.  Patient was not very cooperative with the interview mostly focused on getting something to eat.  He has already requested discharge home saying he does not think he needs to be here and he was not under IVC.  Past Psychiatric History: Past history of substance abuse with mood symptoms.  Chronic opiate use.  Risk to Self:   Risk to Others:   Prior Inpatient Therapy:   Prior Outpatient Therapy:    Past Medical History:  Past Medical History:  Diagnosis Date   Anxiety    Bipolar 1 disorder ([place])    Bronchitis    Cocaine abuse ([place])    Depression    Drug abuse ([place])    Heroin abuse ([place])    Schizophrenia ([place])      Past Surgical History:  Procedure Laterality Date   NOSE SURGERY     TEE WITHOUT CARDIOVERSION N/A 05/10/2016   Procedure: TRANSESOPHAGEAL ECHOCARDIOGRAM (TEE);  Surgeon: Dionisio David, MD;  Location: [facility] ORS;  Service: Cardiovascular;  Laterality: N/A;   TYMPANOPLASTY     Family History:  Family History  Problem Relation Age of Onset   Diabetes Mother    Hypertension Father    Kidney failure Father    Diabetes Father    Family Psychiatric  History: See previous Social History:  Social History   Substance and Sexual Activity  Alcohol Use Yes     Social History   Substance and Sexual Activity  Drug Use Not Currently   Types: IV, Marijuana   Comment: heroine    Social History   Socioeconomic History   Marital status: Married    Spouse name: Not on file   Number of children: Not on file   Years of education: Not on file   Highest education level: Not on file  Occupational History   Not on file  Tobacco Use   Smoking status: Every Day    Packs/day: 1.50    Pack years: 0.00    Types: Cigarettes   Smokeless tobacco: Current    Types: Snuff  Vaping Use   Vaping Use: Never used  Substance and Sexual Activity   Alcohol use: Yes   Drug use: Not Currently    Types: IV, Marijuana    Comment:  heroine   Sexual activity: Not on file  Other Topics Concern   Not on file  Social History Narrative   Not on file   Social Determinants of Health   Financial Resource Strain: Not on file  Food Insecurity: Not on file  Transportation Needs: Not on file  Physical Activity: Not on file  Stress: Not on file  Social Connections: Not on file   Additional Social History:    Allergies:   Allergies  Allergen Reactions   Bactrim [Sulfamethoxazole-Trimethoprim]    Sulfa Antibiotics Other (See Comments) and Rash    Pt states "the skin peels off of my penis" Rash to penis    Labs:  Results for orders placed or performed during the hospital encounter of 02/23/21  (from the past 48 hour(s))  CBC     Status: None   Collection Time: 02/23/21  8:56 PM  Result Value Ref Range   WBC 6.9 4.0 - 10.5 K/uL   RBC 4.94 4.22 - 5.81 MIL/uL   Hemoglobin 14.3 13.0 - 17.0 g/dL   HCT 42.3 39.0 - 52.0 %   MCV 85.6 80.0 - 100.0 fL   MCH 28.9 26.0 - 34.0 pg   MCHC 33.8 30.0 - 36.0 g/dL   RDW 13.1 11.5 - 15.5 %   Platelets 156 150 - 400 K/uL   nRBC 0.0 0.0 - 0.2 %    Comment: Performed at [facility], [address], [place], [place] [address]  Comprehensive metabolic panel     Status: Abnormal   Collection Time: 02/23/21  8:56 PM  Result Value Ref Range   Sodium 142 135 - 145 mmol/L   Potassium 3.6 3.5 - 5.1 mmol/L   Chloride 106 98 - 111 mmol/L   CO2 24 22 - 32 mmol/L   Glucose, Bld 120 (H) 70 - 99 mg/dL    Comment: Glucose reference range applies only to samples taken after fasting for at least 8 hours.   BUN 12 6 - 20 mg/dL   Creatinine, Ser 0.71 0.61 - 1.24 mg/dL   Calcium 8.5 (L) 8.9 - 10.3 mg/dL   Total Protein 7.5 6.5 - 8.1 g/dL   Albumin 3.9 3.5 - 5.0 g/dL   AST 33 15 - 41 U/L   ALT 35 0 - 44 U/L   Alkaline Phosphatase 47 38 - 126 U/L   Total Bilirubin 0.5 0.3 - 1.2 mg/dL   GFR, Estimated >60 >60 mL/min    Comment: (NOTE) Calculated using the CKD-EPI Creatinine Equation (2021)    Anion gap 12 5 - 15    Comment: Performed at [facility], [address], [place], [place] [address]  Ethanol     Status: None   Collection Time: 02/23/21  8:56 PM  Result Value Ref Range   Alcohol, Ethyl (B) <10 <10 mg/dL    Comment: (NOTE) Lowest detectable limit for serum alcohol is 10 mg/dL.  For medical purposes only. Performed at [facility], [place],  [address]   Resp Panel by RT-PCR (Flu A&B, Covid) Nasopharyngeal Swab     Status: None   Collection Time: 02/23/21  9:46 PM   Specimen: Nasopharyngeal Swab; Nasopharyngeal(NP) swabs in vial transport medium  Result Value Ref Range   SARS Coronavirus  2 by RT PCR NEGATIVE NEGATIVE    Comment: (NOTE) SARS-CoV-2 target nucleic acids are NOT DETECTED.  The SARS-CoV-2 RNA is generally detectable in upper respiratory specimens during the acute phase of infection. The  lowest concentration of SARS-CoV-2 viral copies this assay can detect is 138 copies/mL. A negative result does not preclude SARS-Cov-2 infection and should not be used as the sole basis for treatment or other patient management decisions. A negative result may occur with  improper specimen collection/handling, submission of specimen other than nasopharyngeal swab, presence of viral mutation(s) within the areas targeted by this assay, and inadequate number of viral copies(<138 copies/mL). A negative result must be combined with clinical observations, patient history, and epidemiological information. The expected result is Negative.  Fact Sheet for Patients:  EntrepreneurPulse.com.au  Fact Sheet for Healthcare Providers:  IncredibleEmployment.be  This test is no t yet approved or cleared by the [place] FDA and  has been authorized for detection and/or diagnosis of SARS-CoV-2 by FDA under an Emergency Use Authorization (EUA). This EUA will remain  in effect (meaning this test can be used) for the duration of the COVID-19 declaration under Section 564(b)(1) of the Act, 21 U.S.C.section 360bbb-3(b)(1), unless the authorization is terminated  or revoked sooner.       Influenza A by PCR NEGATIVE NEGATIVE   Influenza B by PCR NEGATIVE NEGATIVE    Comment: (NOTE) The Xpert Xpress SARS-CoV-2/FLU/RSV plus assay is intended as an aid in the diagnosis of influenza from Nasopharyngeal swab specimens and should not be used as a sole basis for treatment. Nasal washings and aspirates are unacceptable for Xpert Xpress SARS-CoV-2/FLU/RSV testing.  Fact Sheet for Patients: EntrepreneurPulse.com.au  Fact Sheet for Healthcare  Providers: IncredibleEmployment.be  This test is not yet approved or cleared by the [place] FDA and has been authorized for detection and/or diagnosis of SARS-CoV-2 by FDA under an Emergency Use Authorization (EUA). This EUA will remain in effect (meaning this test can be used) for the duration of the COVID-19 declaration under Section 564(b)(1) of the Act, 21 U.S.C. section 360bbb-3(b)(1), unless the authorization is terminated or revoked.  Performed at [facility], [address] [place], [place] [address]     No current facility-administered medications for this encounter.   Current Outpatient Medications  Medication Sig Dispense Refill   amphetamine-dextroamphetamine (ADDERALL) 30 MG tablet Take 30 mg by mouth 2 (two) times daily.   0   Buprenorphine HCl-Naloxone HCl (SUBOXONE) 8-2 MG FILM Place 1 Film under the tongue 3 (three) times daily.      clonazePAM (KLONOPIN) 1 MG tablet Take 2 mg by mouth at bedtime.   0   gabapentin (NEURONTIN) 400 MG capsule Take 400 mg by mouth 3 (three) times daily.     lurasidone (LATUDA) 40 MG TABS tablet Take 40 mg by mouth daily with breakfast.     naloxone (NARCAN) nasal spray 4 mg/0.1 mL Keep accessible in case of overdose. 1 kit 0    Musculoskeletal: Strength & Muscle Tone: within normal limits Gait & Station: normal Patient leans: N/A            Psychiatric Specialty Exam:  Presentation  General Appearance: Casual  Eye Contact:Good  Speech:Pressured  Speech Volume:Increased  Handedness:Right   Mood and Affect  Mood:Anxious; Euphoric  Affect:Inappropriate; Full Range   Thought Process  Thought Processes:Coherent  Descriptions of Associations:Tangential  Orientation:Full (Time, Place and Person)  Thought Content:Abstract Reasoning; Delusions; Illusions; Paranoid Ideation; Tangential  History of Schizophrenia/Schizoaffective disorder:No  Duration of Psychotic  Symptoms:No data recorded Hallucinations:No data recorded Ideas of Reference:Paranoia  Suicidal Thoughts:No data recorded Homicidal Thoughts:No data recorded  Sensorium  Memory:Immediate Fair; Recent Fair; Remote Fair  Judgment:Poor  Insight:Poor  Executive Functions  Concentration:Poor  Attention Span:Poor  Recall:Poor  [employer] of Knowledge:Poor  Language:Poor   Psychomotor Activity  Psychomotor Activity: No data recorded  Assets  Assets:Desire for Improvement; Financial Resources/Insurance   Sleep  Sleep: No data recorded  Physical Exam: Physical Exam Vitals and nursing note reviewed.  Constitutional:      Appearance: Normal appearance.  HENT:     Head: Normocephalic and atraumatic.     Mouth/Throat:     Pharynx: Oropharynx is clear.  Eyes:     Pupils: Pupils are equal, round, and reactive to light.  Cardiovascular:     Rate and Rhythm: Normal rate and regular rhythm.  Pulmonary:     Effort: Pulmonary effort is normal.     Breath sounds: Normal breath sounds.  Abdominal:     General: Abdomen is flat.     Palpations: Abdomen is soft.  Musculoskeletal:        General: Normal range of motion.  Skin:    General: Skin is warm and dry.  Neurological:     General: No focal deficit present.     Mental Status: He is alert. Mental status is at baseline.  Psychiatric:        Attention and Perception: Attention normal.        Mood and Affect: Mood normal.        Speech: Speech normal.        Behavior: Behavior normal.        Thought Content: Thought content normal.        Cognition and Memory: Cognition normal. Memory is impaired.   Review of Systems  Constitutional: Negative.   HENT: Negative.    Eyes: Negative.   Respiratory: Negative.    Cardiovascular: Negative.   Gastrointestinal: Negative.   Musculoskeletal: Negative.   Skin: Negative.   Neurological: Negative.   Psychiatric/Behavioral:  Positive for substance abuse. Negative for depression  and suicidal ideas.  [id number] Blood pressure (!) 131/95, pulse (!) 108, temperature (!) 97.5 F (36.4 C), temperature source Oral, resp. rate 16, height 5' 8" (1.727 m), weight 106.6 kg, SpO2 97 %. Body mass index is 35.73 kg/m.  Treatment Plan Summary: Plan patient is currently calm lucid and free of any hallucinations or delusions.  Denies suicidal or homicidal ideation.  Baseline thinking and behavior.  Does not meet commitment criteria does not require inpatient hospitalization.  Supportive counseling and encouragement to continue with his outpatient treatment.  No new prescriptions required.  Case reviewed with emergency room physician and TTS.  Patient can be discharged from the emergency room.  Disposition: No evidence of imminent risk to self or others at present.   Patient does not meet criteria for psychiatric inpatient admission. Supportive therapy provided about ongoing stressors. Discussed crisis plan, support from social network, calling 911, coming to the Emergency Department, and calling Suicide Hotline.  Alethia Berthold, MD 02/24/2021 1:56 PM

## 2021-02-24 NOTE — ED Notes (Signed)
VOL/Consult pending when patient more alert

## 2021-02-24 NOTE — ED Notes (Signed)
Pt given pair of non-skid socks per request at this time.

## 2021-02-24 NOTE — ED Notes (Signed)
Lunch tray given. No other needs found at this moment.  

## 2021-02-24 NOTE — ED Notes (Signed)
Report off to raquel rn  

## 2021-02-24 NOTE — ED Notes (Signed)
pt asleep vs will be assess when pt is awake.  

## 2021-02-24 NOTE — ED Notes (Signed)
Pt resting comfortably with eyes closed, no distress noted.  

## 2021-02-24 NOTE — ED Notes (Signed)
Pt signed physical discharge form. 

## 2021-02-24 NOTE — ED Notes (Signed)
Pt verbalized c/o R hip pain to EDT Gaby and this RN. MD Cyril Loosen made aware at this time, awaiting any new orders.

## 2021-02-24 NOTE — ED Notes (Signed)
Patient resting comfortably in room. No complaints or concerns voiced. No distress or abnormal behavior noted. Will continue to monitor with security cameras. Q 15 minute rounds continue. 

## 2021-02-24 NOTE — ED Notes (Signed)
Patient observed with no unusual behavior or acute distress. Patient with no verbalized needs or c/o at this time.... will continue to monitor and follow up as needed. Security staff monitoring patient on camera system.  

## 2021-02-24 NOTE — Progress Notes (Signed)
Psychiatry brief note:  TTS counselor and I came to see the patient in the emergency room, but he was not arousable to where he could participate in the assessment. The patient had used methamphetamine and remained slightly responsive but currently calm, with stable vitals and appropriate treatment.  At this time, we cannot do his psychiatric assessment and will reassess once she is awake.

## 2021-02-24 NOTE — ED Notes (Signed)
This RN ambulated pt to lobby to wait on transport. Pt shown lobby phone to make a call.

## 2021-02-24 NOTE — BH Assessment (Signed)
This writer was unable to assess pt as pt was too drowsy to participate. TTS to follow up.

## 2021-02-24 NOTE — ED Provider Notes (Signed)
Patient cleared by psychiatry.  He remains hemodynamically stable.  Discharge home.   Shaune Pollack, MD 02/24/21 [phone]

## 2021-02-24 NOTE — ED Notes (Signed)
Pt resting quietly with eyes closed, will not disturb for VS at this time.

## 2021-02-24 NOTE — ED Notes (Signed)
Report received from Amy RN. Patient care assumed. Patient/RN introduction complete. Will continue to monitor.  

## 2021-02-24 NOTE — ED Notes (Signed)
Pt noted to be resting in bed at this time. No complaints or concerns voiced at this time. Pt in NAD at this time.

## 2021-02-27 ENCOUNTER — Encounter: Payer: Self-pay | Admitting: Emergency Medicine

## 2021-02-27 ENCOUNTER — Emergency Department: Payer: No Typology Code available for payment source

## 2021-02-27 ENCOUNTER — Emergency Department
Admission: EM | Admit: 2021-02-27 | Discharge: 2021-02-27 | Disposition: A | Discharge status: Home / Self Care | Payer: No Typology Code available for payment source | Attending: Emergency Medicine | Admitting: Emergency Medicine

## 2021-02-27 ENCOUNTER — Other Ambulatory Visit: Payer: Self-pay

## 2021-02-27 DIAGNOSIS — F1910 Other psychoactive substance abuse, uncomplicated: Secondary | ICD-10-CM | POA: Insufficient documentation

## 2021-02-27 DIAGNOSIS — F15251 Other stimulant dependence with stimulant-induced psychotic disorder with hallucinations: Secondary | ICD-10-CM | POA: Insufficient documentation

## 2021-02-27 DIAGNOSIS — Z20822 Contact with and (suspected) exposure to covid-19: Secondary | ICD-10-CM | POA: Insufficient documentation

## 2021-02-27 DIAGNOSIS — F1110 Opioid abuse, uncomplicated: Secondary | ICD-10-CM | POA: Diagnosis not present

## 2021-02-27 DIAGNOSIS — R109 Unspecified abdominal pain: Secondary | ICD-10-CM | POA: Diagnosis not present

## 2021-02-27 DIAGNOSIS — Y900 Blood alcohol level of less than 20 mg/100 ml: Secondary | ICD-10-CM | POA: Insufficient documentation

## 2021-02-27 DIAGNOSIS — F1510 Other stimulant abuse, uncomplicated: Secondary | ICD-10-CM | POA: Diagnosis not present

## 2021-02-27 DIAGNOSIS — F1994 Other psychoactive substance use, unspecified with psychoactive substance-induced mood disorder: Secondary | ICD-10-CM | POA: Diagnosis not present

## 2021-02-27 DIAGNOSIS — R4182 Altered mental status, unspecified: Secondary | ICD-10-CM | POA: Insufficient documentation

## 2021-02-27 DIAGNOSIS — Z79899 Other long term (current) drug therapy: Secondary | ICD-10-CM | POA: Insufficient documentation

## 2021-02-27 DIAGNOSIS — F17210 Nicotine dependence, cigarettes, uncomplicated: Secondary | ICD-10-CM | POA: Insufficient documentation

## 2021-02-27 DIAGNOSIS — F22 Delusional disorders: Secondary | ICD-10-CM | POA: Diagnosis not present

## 2021-02-27 LAB — CBC WITH DIFFERENTIAL/PLATELET
Abs Immature Granulocytes: 0.03 10*3/uL (ref 0.00–0.07)
Basophils Absolute: 0 10*3/uL (ref 0.0–0.1)
Basophils Relative: 1 %
Eosinophils Absolute: 0.2 10*3/uL (ref 0.0–0.5)
Eosinophils Relative: 2 %
HCT: 38.3 % — ABNORMAL LOW (ref 39.0–52.0)
Hemoglobin: 13.3 g/dL (ref 13.0–17.0)
Immature Granulocytes: 0 %
Lymphocytes Relative: 23 %
Lymphs Abs: 1.8 10*3/uL (ref 0.7–4.0)
MCH: 29.4 pg (ref 26.0–34.0)
MCHC: 34.7 g/dL (ref 30.0–36.0)
MCV: 84.5 fL (ref 80.0–100.0)
Monocytes Absolute: 0.5 10*3/uL (ref 0.1–1.0)
Monocytes Relative: 7 %
Neutro Abs: 5.2 10*3/uL (ref 1.7–7.7)
Neutrophils Relative %: 67 %
Platelets: 160 10*3/uL (ref 150–400)
RBC: 4.53 MIL/uL (ref 4.22–5.81)
RDW: 13.1 % (ref 11.5–15.5)
WBC: 7.7 10*3/uL (ref 4.0–10.5)
nRBC: 0 % (ref 0.0–0.2)

## 2021-02-27 LAB — URINALYSIS, COMPLETE (UACMP) WITH MICROSCOPIC
Bacteria, UA: NONE SEEN
Bilirubin Urine: NEGATIVE
Glucose, UA: NEGATIVE mg/dL
Hgb urine dipstick: NEGATIVE
Ketones, ur: NEGATIVE mg/dL
Leukocytes,Ua: NEGATIVE
Nitrite: NEGATIVE
Protein, ur: NEGATIVE mg/dL
Specific Gravity, Urine: 1.025 (ref 1.005–1.030)
Squamous Epithelial / HPF: NONE SEEN (ref 0–5)
pH: 6 (ref 5.0–8.0)

## 2021-02-27 LAB — COMPREHENSIVE METABOLIC PANEL
ALT: 32 U/L (ref 0–44)
AST: 34 U/L (ref 15–41)
Albumin: 3.9 g/dL (ref 3.5–5.0)
Alkaline Phosphatase: 51 U/L (ref 38–126)
Anion gap: 6 (ref 5–15)
BUN: 13 mg/dL (ref 6–20)
CO2: 28 mmol/L (ref 22–32)
Calcium: 8.4 mg/dL — ABNORMAL LOW (ref 8.9–10.3)
Chloride: 105 mmol/L (ref 98–111)
Creatinine, Ser: 0.81 mg/dL (ref 0.61–1.24)
GFR, Estimated: >60 mL/min (ref >60)
Glucose, Bld: 101 mg/dL — ABNORMAL HIGH (ref 70–99)
Potassium: 3.9 mmol/L (ref 3.5–5.1)
Sodium: 139 mmol/L (ref 135–145)
Total Bilirubin: 0.7 mg/dL (ref 0.3–1.2)
Total Protein: 7.2 g/dL (ref 6.5–8.1)

## 2021-02-27 LAB — URINE DRUG SCREEN, QUALITATIVE (ARMC ONLY)
Amphetamines, Ur Screen: POSITIVE — AB
Barbiturates, Ur Screen: NOT DETECTED
Benzodiazepine, Ur Scrn: POSITIVE — AB
Cannabinoid 50 Ng, Ur ~~LOC~~: POSITIVE — AB
Cocaine Metabolite,Ur ~~LOC~~: POSITIVE — AB
MDMA (Ecstasy)Ur Screen: NOT DETECTED
Methadone Scn, Ur: NOT DETECTED
Opiate, Ur Screen: NOT DETECTED
Phencyclidine (PCP) Ur S: NOT DETECTED
Tricyclic, Ur Screen: NOT DETECTED

## 2021-02-27 LAB — CBC
HCT: 40.8 % (ref 39.0–52.0)
Hemoglobin: 13.9 g/dL (ref 13.0–17.0)
MCH: 29.3 pg (ref 26.0–34.0)
MCHC: 34.1 g/dL (ref 30.0–36.0)
MCV: 86.1 fL (ref 80.0–100.0)
Platelets: 156 10*3/uL (ref 150–400)
RBC: 4.74 MIL/uL (ref 4.22–5.81)
RDW: 12.9 % (ref 11.5–15.5)
WBC: 7.4 10*3/uL (ref 4.0–10.5)
nRBC: 0 % (ref 0.0–0.2)

## 2021-02-27 LAB — ETHANOL: Alcohol, Ethyl (B): <10 mg/dL (ref <10)

## 2021-02-27 LAB — CBG MONITORING, ED: Glucose-Capillary: 122 mg/dL — ABNORMAL HIGH (ref 70–99)

## 2021-02-27 LAB — ACETAMINOPHEN LEVEL: Acetaminophen (Tylenol), Serum: <10 ug/mL — ABNORMAL LOW (ref 10–30)

## 2021-02-27 LAB — SALICYLATE LEVEL: Salicylate Lvl: <7 mg/dL — ABNORMAL LOW (ref 7.0–30.0)

## 2021-02-27 LAB — MAGNESIUM: Magnesium: 2.4 mg/dL (ref 1.7–2.4)

## 2021-02-27 LAB — LIPASE, BLOOD: Lipase: 24 U/L (ref 11–51)

## 2021-02-27 MED ORDER — SODIUM CHLORIDE 0.9 % IV BOLUS
1000.0000 mL | Freq: Once | INTRAVENOUS | Status: AC
  Administered 2021-02-27: 1000 mL via INTRAVENOUS

## 2021-02-27 MED ORDER — BUPRENORPHINE HCL-NALOXONE HCL 8-2 MG SL SUBL
1.0000 | SUBLINGUAL_TABLET | Freq: Once | SUBLINGUAL | Status: AC
  Administered 2021-02-27: 1 via SUBLINGUAL
  Filled 2021-02-27: qty 1

## 2021-02-27 MED ORDER — SODIUM CHLORIDE 0.9 % IV SOLN: INTRAVENOUS | Status: DC

## 2021-02-27 NOTE — ED Notes (Signed)
Pt states 'I cleaned up for yall, somebody hopefully is coming soon'

## 2021-02-27 NOTE — BH Assessment (Signed)
Comprehensive Clinical Assessment (CCA) Screening, Triage and Referral Note  02/27/2021 Randy Woods. [id number]  Randy Woods. Is a 40 year old male who presents to the ER as a result of his substance use. While in the ER asked for information for outpatient treatment. Writer provided him with contact information for [employer] and Building surveyor with [facility]. Throughout the interview, the patient denied SI/HI and AV/H.   Chief Complaint:  Chief Complaint  Patient presents with   Altered Mental Status    Pt on unknown amt of substances   Visit Diagnosis: Substance Abuse Disorder  Patient Reported Information How did you hear about [place]? Other (Comment)  What Is the Reason for Your Visit/Call Today? Law Enforcement  How Long Has This Been Causing You Problems? > than 6 months  What Do You Feel Would Help You the Most Today? Alcohol or Drug Use Treatment   Have You Recently Had Any Thoughts About Hurting Yourself? No  Are You Planning to Commit Suicide/Harm Yourself At This time? No   Have you Recently Had Thoughts About Hurting Someone Randy Woods? No  Are You Planning to Harm Someone at This Time? No  Explanation: No data recorded  Have You Used Any Alcohol or Drugs in the Past 24 Hours? No  How Long Ago Did You Use Drugs or Alcohol? No data recorded What Did You Use and How Much? Cocaine and cannabis. Unable to quantify the amount   Do You Currently Have a Therapist/Psychiatrist? No  Name of Therapist/Psychiatrist: No data recorded  Have You Been Recently Discharged From Any Office Practice or Programs? No  Explanation of Discharge From Practice/Program: No data recorded   CCA Screening Triage Referral Assessment Type of Contact: Face-to-Face  Telemedicine Service Delivery:   Is this Initial or Reassessment? No data recorded Date Telepsych consult ordered in [facility]:  No data recorded Time Telepsych consult ordered in [facility]:  No data recorded Location of Assessment:  [facility] ED  Provider Location: [facility] ED   Collateral Involvement: None provided   Does Patient Have a Court Appointed Legal Guardian? No data recorded Name and Contact of Legal Guardian: No data recorded If Minor and Not Living with Parent(s), Who has Custody? n/a  Is CPS involved or ever been involved? Never  Is APS involved or ever been involved? Never   Patient Determined To Be At Risk for Harm To Self or Others Based on Review of Patient Reported Information or Presenting Complaint? No  Method: No data recorded Availability of Means: No data recorded Intent: No data recorded Notification Required: No data recorded Additional Information for Danger to Others Potential: No data recorded Additional Comments for Danger to Others Potential: No data recorded Are There Guns or Other Weapons in Your Home? No data recorded Types of Guns/Weapons: No data recorded Are These Weapons Safely Secured?                            No data recorded Who Could Verify You Are Able To Have These Secured: No data recorded Do You Have any Outstanding Charges, Pending Court Dates, Parole/Probation? No data recorded Contacted To Inform of Risk of Harm To Self or Others: No data recorded  Does Patient Present under Involuntary Commitment? No  IVC Papers Initial File Date: No data recorded  [place] of Residence: [place]   Patient Currently Receiving the Following Services: Not Receiving Services   Determination of Need: Emergent (2 hours)  Options For Referral: Outpatient Therapy   Discharge Disposition:   Lilyan Gilford MS, LCAS, [facility], [facility] Therapeutic Triage Specialist 02/27/2021 4:26 PM

## 2021-02-27 NOTE — ED Triage Notes (Signed)
Ems was called to hotel where pt was lethargic and acting strangely.  Ems found month supply of medication strips. Pt admits to meth and gabapentin use. Pt was dc'd from er recently for same occurrence.

## 2021-02-27 NOTE — ED Notes (Signed)
Pt has phone and is still working on getting a ride home

## 2021-02-27 NOTE — ED Notes (Signed)
Meal tray provided, pt having bm

## 2021-02-27 NOTE — ED Notes (Signed)
Pt states 'my child's mother is around the corner,' provided body wash, lotion and comb. Assisted to ed lobby, steady gait

## 2021-02-27 NOTE — ED Notes (Signed)
Pt has 2 belongings bags one with his clothes black shorts, grey t-shirt and pain of sandals

## 2021-02-27 NOTE — ED Notes (Signed)
Pt resting with eyes closed. VSS. NAD Call light within reach. Will continue to monitor for changes.  

## 2021-02-27 NOTE — ED Triage Notes (Signed)
Pt presents to ER under IVC reports feeling anxious, per IVC petition pt called [facility] and informed them he was scared and and reported that someone was going to kill him. Pt  denies any SI or HI denies any visual or auditory hallucinations.

## 2021-02-27 NOTE — ED Provider Notes (Signed)
[facility] Emergency Department Provider Note  ____________________________________________   Event Date/Time   First MD Initiated Contact with Patient 02/27/21 [phone]     (approximate)  I have reviewed the triage vital signs and the nursing notes.   HISTORY  Chief Complaint Altered Mental Status (Pt on unknown amt of substances)    HPI Randy Woods. is a 40 y.o. male with history of bipolar disorder, schizophrenia, substance abuse who presents to the emergency department with EMS from [facility] after he was found wandering the hallways, intoxicated.  He is unable to provide much history.  Does state that he took Suboxone, gabapentin tonight but is unable to tell me if there was any other drug or alcohol use.  Falls asleep quickly during questioning.  Unable to tell me if he is having suicidal or homicidal thoughts.  No signs of trauma on exam.  He complains of abdominal pain.        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   Facial cellulitis 07/22/2020   Opiate abuse, episodic (HCC) 02/07/2017   Substance induced mood disorder (HCC) 02/07/2017   Opiate overdose (HCC) 11/12/2016   Bipolar 1 disorder, mixed, moderate (HCC) 05/07/2016   Staphylococcal sepsis (HCC) 05/06/2016   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 Authorizing Provider  amphetamine-dextroamphetamine (ADDERALL) 30 MG tablet Take 30 mg by mouth 2 (two) times daily.     [provider]  Buprenorphine HCl-Naloxone HCl (SUBOXONE) 8-2 MG FILM Place 1 Film under the tongue 3 (three) times daily.     [provider]  clonazePAM (KLONOPIN) 1 MG tablet Take 2 mg by mouth at bedtime.     [provider]  gabapentin (NEURONTIN) 400 MG capsule Take 400 mg by mouth 3 (three) times daily.    [provider]  lurasidone (LATUDA) 40 MG TABS tablet Take 40 mg by mouth daily with breakfast.    [provider]  naloxone (NARCAN) nasal spray 4 mg/0.1 mL Keep accessible in case of overdose. 01/24/19   Sharman Cheek, MD    Allergies Bactrim [sulfamethoxazole-trimethoprim] and Sulfa antibiotics  Family History  Problem Relation Age of Onset   Diabetes Mother    Hypertension Father    Kidney failure Father    Diabetes Father     Social History Social History   Tobacco Use   Smoking status: Every Day    Packs/day: 1.50    Pack years: 0.00    Types: Cigarettes   Smokeless tobacco: Current    Types: Snuff  Vaping Use   Vaping Use: Never used  Substance  Use Topics   Alcohol use: Yes   Drug use: Not Currently    Types: IV, Marijuana, Methamphetamines    Comment: heroine    Review of Systems Level 5 caveat secondary to intoxication  ____________________________________________   PHYSICAL EXAM:  VITAL SIGNS: Today's Vitals   02/27/21 0530 02/27/21 0600  BP: 108/65   Pulse: 93   Resp: 14   Temp:  98.3 F (36.8 C)  SpO2: 96%    There is no height or weight on file to calculate BMI.  CONSTITUTIONAL: Alert and oriented person.  Chronically ill-appearing.  Obese.  Intoxicated, drowsy. HEAD: Normocephalic,  atraumatic EYES: Conjunctivae clear, pupils appear equal, EOM appear intact ENT: normal nose; moist mucous membranes NECK: Supple, normal ROM, no midline spinal tenderness or step-off or deformity CARD: RRR; S1 and S2 appreciated; no murmurs, no clicks, no rubs, no gallops RESP: Normal chest excursion without splinting or tachypnea; breath sounds clear and equal bilaterally; no wheezes, no rhonchi, no rales, no hypoxia or respiratory distress, speaking full sentences ABD/GI: Normal bowel sounds; non-distended; soft, non-tender, no rebound, no guarding, no peritoneal signs, no hepatosplenomegaly BACK: The back appears normal, no midline spinal tenderness or step-off or deformity EXT: Normal ROM in all joints; no deformity noted, no edema; no cyanosis SKIN: Normal color for age and race; warm; no rash on exposed skin NEURO: Moves all extremities equally, no facial asymmetry, slurred speech PSYCH: The patient's mood and manner are appropriate.  ____________________________________________   LABS (all labs ordered are listed, but only abnormal results are displayed)  Labs Reviewed  COMPREHENSIVE METABOLIC PANEL - Abnormal; Notable for the following components:      Result Value   Glucose, Bld 101 (*)    Calcium 8.4 (*)    All other components within normal limits  CBC WITH DIFFERENTIAL/PLATELET - Abnormal; Notable for the following components:   HCT 38.3 (*)    All other components within normal limits  CBG MONITORING, ED - Abnormal; Notable for the following components:   Glucose-Capillary 122 (*)    All other components within normal limits  LIPASE, BLOOD  MAGNESIUM  URINE DRUG SCREEN, QUALITATIVE ([facility] ONLY)  URINALYSIS, COMPLETE (UACMP) WITH MICROSCOPIC  ACETAMINOPHEN LEVEL  ETHANOL  SALICYLATE LEVEL  CBG MONITORING, ED   ____________________________________________  EKG   EKG Interpretation  Date/Time:  Friday February 27 2021 04:51:48 EDT Ventricular Rate:  101 PR  Interval:  135 QRS Duration: 104 QT Interval:  354 QTC Calculation: 459 R Axis:   68 Text Interpretation: Sinus tachycardia Multiple ventricular premature complexes RSR' in V1 or V2, right VCD or RVH Baseline wander in lead(s) V2 Confirmed by Rochele Raring [phone]) on 02/27/2021 4:55:03 AM         ____________________________________________  RADIOLOGY Normajean Baxter Adabelle Griffiths, personally viewed and evaluated these images (plain radiographs) as part of my medical decision making, as well as reviewing the written report by the radiologist.  ED MD interpretation: CT head shows no acute abnormality.  Official radiology report(s): CT HEAD WO CONTRAST  Result Date: 02/27/2021 CLINICAL DATA:  Altered mental status, methamphetamine use EXAM: CT HEAD WITHOUT CONTRAST TECHNIQUE: Contiguous axial images were obtained from the base of the skull through the vertex without intravenous contrast. COMPARISON:  CT 10/23/2019 FINDINGS: Brain: No evidence of acute infarction, hemorrhage, hydrocephalus, extra-axial collection, visible mass lesion or mass effect. Vascular: Atherosclerotic calcification of the carotid siphons. No hyperdense vessel. Skull: No calvarial fracture or suspicious osseous lesion. No acute scalp swelling or hematoma. Left frontal  scalp infiltration is likely scarring related to prior hematoma and laceration seen on comparison imaging. Sinuses/Orbits: Stable nasal septal deviation with a right-sided nasal septal spur. Minimal thickening in the ethmoids and nasal passages. Paranasal sinuses and mastoid air cells are otherwise predominantly clear. Remote left lamina papyracea fracture. Included orbital structures are unremarkable. Other: None. IMPRESSION: No acute intracranial abnormality. Intracranial atherosclerosis. Remote left lamina papyracea fracture. Electronically Signed   By: Kreg Shropshire M.D.   On: 02/27/2021 05:25     ____________________________________________   PROCEDURES  Procedure(s) performed (including Critical Care):  Procedures  ____________________________________________   INITIAL IMPRESSION / ASSESSMENT AND PLAN / ED COURSE  As part of my medical decision making, I reviewed the following data within the electronic MEDICAL RECORD NUMBER Nursing notes reviewed and incorporated, Labs reviewed , Old chart reviewed, Radiograph reviewed , Notes from prior ED visits, and  Controlled Substance Database         Patient here with altered mental status likely from acute intoxication from substance abuse.  CBG here normal.  Patient unable to provide much history.  No signs of traumatic injury on exam.  Will obtain labs, urine, CT of the head.  Doubt CVA, intracranial hemorrhage.  Complaining of abdominal pain but his abdominal exam is benign.  Will give IV fluids and continue to closely monitor.  No hypoxia.  Protecting airway.  No indications for Narcan at this time.  Patient will need to be reassessed when clinically sober.  ED PROGRESS  Labs unremarkable.  Patient still intoxicated, sleeping.  Protecting airway.  Arousable to voice.  Urine pending.  We will continue to monitor until clinical sobriety and anticipate discharge home as long as he has acute medical or psychiatric concern.  Signed out the oncoming ED physician.  I reviewed all nursing notes and pertinent previous records as available.  I have reviewed and interpreted any EKGs, lab and urine results, imaging (as available).  ____________________________________________   FINAL CLINICAL IMPRESSION(S) / ED DIAGNOSES  Final diagnoses:  Substance abuse (HCC)  Altered mental status, unspecified altered mental status type     ED Discharge Orders     None       *Please note:  [name]. was evaluated in Emergency Department on 02/27/2021 for the symptoms described in the history of present illness. He was evaluated in  the context of the global COVID-19 pandemic, which necessitated consideration that the patient might be at risk for infection with the SARS-CoV-2 virus that causes COVID-19. Institutional protocols and algorithms that pertain to the evaluation of patients at risk for COVID-19 are in a state of rapid change based on information released by regulatory bodies including the CDC and federal and state organizations. These policies and algorithms were followed during the patient's care in the ED.  Some ED evaluations and interventions may be delayed as a result of limited staffing during and the pandemic.*   Note:  This document was prepared using Dragon voice recognition software and may include unintentional dictation errors.    Caedyn Raygoza, Layla Maw, DO 02/27/21 [phone]

## 2021-02-27 NOTE — ED Provider Notes (Signed)
-----------------------------------------   3:00 PM on 02/27/2021 -----------------------------------------  I took over care of this patient from Dr. Elesa Massed on the night shift.  The patient has been asleep for most of the shift.  On my reassessment now, he is alert and oriented.  He states that he took too many Klonopin and gabapentin and believes this is the reason that he was so out of it.  The patient states that this was unintentional.  He denies any thoughts of self-harm.  There is no evidence of danger to self or others or indication for emergent psychiatry evaluation.  He would potentially be interested in inpatient detox, so I have consulted TTS.  I signed him out to the oncoming ED physician Dr. Roxan Hockey.   Dionne Bucy, MD 02/27/21 1536

## 2021-02-27 NOTE — ED Notes (Signed)
Pt resting with eyes closed. VSS. NAD. Equal rise and fall of chest. Call light within reach. Will continue to monitor for changes.

## 2021-02-27 NOTE — ED Provider Notes (Signed)
Patient provided resources by TTS.  Again adamantly denies any SI or HI.  Does not meet criteria for IVC.  Feels back to his baseline.  Does appear stable and appropriate for discharge.   Willy Eddy, MD 02/27/21 1600

## 2021-02-28 ENCOUNTER — Emergency Department (EMERGENCY_DEPARTMENT_HOSPITAL)
Admission: EM | Admit: 2021-02-28 | Discharge: 2021-02-28 | Disposition: A | Discharge status: Home / Self Care | Payer: No Typology Code available for payment source | Source: Home / Self Care | Attending: Emergency Medicine | Admitting: Emergency Medicine

## 2021-02-28 DIAGNOSIS — F1110 Opioid abuse, uncomplicated: Secondary | ICD-10-CM | POA: Diagnosis present

## 2021-02-28 DIAGNOSIS — F1994 Other psychoactive substance use, unspecified with psychoactive substance-induced mood disorder: Secondary | ICD-10-CM | POA: Diagnosis present

## 2021-02-28 DIAGNOSIS — F15950 Other stimulant use, unspecified with stimulant-induced psychotic disorder with delusions: Secondary | ICD-10-CM | POA: Diagnosis not present

## 2021-02-28 DIAGNOSIS — F1510 Other stimulant abuse, uncomplicated: Secondary | ICD-10-CM | POA: Diagnosis present

## 2021-02-28 DIAGNOSIS — F1910 Other psychoactive substance abuse, uncomplicated: Secondary | ICD-10-CM

## 2021-02-28 DIAGNOSIS — F22 Delusional disorders: Secondary | ICD-10-CM

## 2021-02-28 LAB — URINE DRUG SCREEN, QUALITATIVE (ARMC ONLY)
Amphetamines, Ur Screen: POSITIVE — AB
Barbiturates, Ur Screen: NOT DETECTED
Benzodiazepine, Ur Scrn: POSITIVE — AB
Cannabinoid 50 Ng, Ur ~~LOC~~: POSITIVE — AB
Cocaine Metabolite,Ur ~~LOC~~: POSITIVE — AB
MDMA (Ecstasy)Ur Screen: NOT DETECTED
Methadone Scn, Ur: NOT DETECTED
Opiate, Ur Screen: NOT DETECTED
Phencyclidine (PCP) Ur S: NOT DETECTED
Tricyclic, Ur Screen: NOT DETECTED

## 2021-02-28 LAB — RESP PANEL BY RT-PCR (FLU A&B, COVID) ARPGX2
Influenza A by PCR: NEGATIVE
Influenza B by PCR: NEGATIVE
SARS Coronavirus 2 by RT PCR: NEGATIVE

## 2021-02-28 LAB — ETHANOL: Alcohol, Ethyl (B): <10 mg/dL (ref <10)

## 2021-02-28 LAB — COMPREHENSIVE METABOLIC PANEL
ALT: 33 U/L (ref 0–44)
AST: 32 U/L (ref 15–41)
Albumin: 3.8 g/dL (ref 3.5–5.0)
Alkaline Phosphatase: 56 U/L (ref 38–126)
Anion gap: 5 (ref 5–15)
BUN: 14 mg/dL (ref 6–20)
CO2: 28 mmol/L (ref 22–32)
Calcium: 8.5 mg/dL — ABNORMAL LOW (ref 8.9–10.3)
Chloride: 103 mmol/L (ref 98–111)
Creatinine, Ser: 0.81 mg/dL (ref 0.61–1.24)
GFR, Estimated: >60 mL/min (ref >60)
Glucose, Bld: 118 mg/dL — ABNORMAL HIGH (ref 70–99)
Potassium: 3.6 mmol/L (ref 3.5–5.1)
Sodium: 136 mmol/L (ref 135–145)
Total Bilirubin: 0.6 mg/dL (ref 0.3–1.2)
Total Protein: 7.1 g/dL (ref 6.5–8.1)

## 2021-02-28 MED ORDER — BUPRENORPHINE HCL-NALOXONE HCL 8-2 MG SL SUBL
1.0000 | SUBLINGUAL_TABLET | Freq: Every day | SUBLINGUAL | Status: DC
Start: 2021-02-28 — End: 2021-02-28
  Administered 2021-02-28: 1 via SUBLINGUAL
  Filled 2021-02-28: qty 1

## 2021-02-28 MED ORDER — CLONAZEPAM 0.5 MG PO TABS
1.0000 mg | ORAL_TABLET | Freq: Two times a day (BID) | ORAL | Status: DC
  Administered 2021-02-28: 1 mg via ORAL
  Filled 2021-02-28: qty 2

## 2021-02-28 MED ORDER — CLONAZEPAM 0.5 MG PO TABS: 1.0000 mg | ORAL_TABLET | Freq: Two times a day (BID) | ORAL | Status: DC

## 2021-02-28 NOTE — ED Notes (Signed)
Hourly rounding performed, patient currently asleep in hallway bed. Patient has no complaints at this time. Q15 minute rounds and monitoring via Rover and Officer to continue. 

## 2021-02-28 NOTE — ED Notes (Signed)
Pt to restroom at this time.

## 2021-02-28 NOTE — ED Provider Notes (Signed)
[facility] Emergency Department Provider Note   ____________________________________________   Event Date/Time   First MD Initiated Contact with Patient 02/28/21 0021     (approximate)  I have reviewed the triage vital signs and the nursing notes.   HISTORY  Chief Complaint Addiction Problem    HPI Randy Woods. is a 40 y.o. male with the below stated past medical history who presents under involuntary commitment by law enforcement after stating he was "scared that someone is out to get him.  Siddh was found in a gas station screaming that people were going to kill him and that the bloods and some parameters were out to get him.  [name] is reacting to internal stimuli and extremely paranoid".  Patient is calm and cooperative at this time.  States that he feels someone is going "to walk by and kill him".  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   Facial cellulitis 07/22/2020   Opiate abuse, episodic (HCC) 02/07/2017   Substance induced mood disorder (HCC) 02/07/2017   Opiate overdose (HCC) 11/12/2016   Bipolar 1 disorder, mixed, moderate (HCC) 05/07/2016   Staphylococcal sepsis (HCC) 05/06/2016   Severe recurrent major depression without psychotic features (HCC) 05/22/2015   Opiate abuse, continuous (HCC) 05/22/2015   Cocaine abuse (HCC) 05/22/2015    Past Surgical  History:  Procedure Laterality Date   NOSE SURGERY     TEE WITHOUT CARDIOVERSION N/A 05/10/2016   Procedure: TRANSESOPHAGEAL ECHOCARDIOGRAM (TEE);  Surgeon: [name], MD;  Location: [facility] ORS;  Service: Cardiovascular;  Laterality: N/A;   TYMPANOPLASTY      Prior to Admission medications   Medication Sig Start Date End Date Taking? Authorizing Provider  amphetamine-dextroamphetamine (ADDERALL) 30 MG tablet Take 30 mg by mouth 2 (two) times daily.     [provider]  Buprenorphine HCl-Naloxone HCl (SUBOXONE) 8-2 MG FILM Place 1 Film under the tongue 3 (three) times daily.     [provider]  clonazePAM (KLONOPIN) 1 MG tablet Take 1 mg by mouth 2 (two) times daily.    [provider]  gabapentin (NEURONTIN) 400 MG capsule Take 400 mg by mouth 3 (three) times daily.    [provider]  lurasidone (LATUDA) 40 MG TABS tablet Take 40 mg by mouth daily with breakfast.    [provider]  naloxone (NARCAN) nasal spray 4 mg/0.1 mL Keep accessible in case of overdose. 01/24/19   Sharman Cheek, MD    Allergies Bactrim [sulfamethoxazole-trimethoprim] and Sulfa antibiotics  Family History  Problem Relation Age of Onset   Diabetes Mother    Hypertension Father    Kidney failure Father    Diabetes Father     Social History Social History   Tobacco Use   Smoking status: Every Day    Packs/day: 1.50    Pack years: 0.00    Types: Cigarettes  Smokeless tobacco: Current    Types: Snuff  Vaping Use   Vaping Use: Never used  Substance Use Topics   Alcohol use: Yes   Drug use: Not Currently    Types: IV, Marijuana, Methamphetamines    Comment: heroine    Review of Systems Constitutional: No fever/chills Eyes: No visual changes. ENT: No sore throat. Cardiovascular: Denies chest pain. Respiratory: Denies shortness of breath. Gastrointestinal: No abdominal pain.  No nausea, no vomiting.  No diarrhea. Genitourinary: Negative for  dysuria. Musculoskeletal: Negative for acute arthralgias Skin: Negative for rash. Neurological: Negative for headaches, weakness/numbness/paresthesias in any extremity Psychiatric: Negative for suicidal ideation/homicidal ideation   ____________________________________________   PHYSICAL EXAM:  VITAL SIGNS: ED Triage Vitals  Enc Vitals Group     BP 02/27/21 2336 (!) 127/92     Pulse Rate 02/27/21 2336 97     Resp 02/27/21 2336 18     Temp 02/27/21 2336 98.1 F (36.7 C)     Temp Source 02/27/21 2336 Oral     SpO2 02/27/21 2336 97 %     Weight 02/27/21 2337 236 lb (107 kg)     Height 02/27/21 2337 5\' 8"  (1.727 m)     Head Circumference --      Peak Flow --      Pain Score 02/27/21 2337 0     Pain Loc --      Pain Edu? --      Excl. in GC? --    Constitutional: Alert and oriented. Well appearing and in no acute distress. Eyes: Conjunctivae are normal. PERRL. Head: Atraumatic. Nose: No congestion/rhinnorhea. Mouth/Throat: Mucous membranes are moist. Neck: No stridor Cardiovascular: Grossly normal heart sounds.  Good peripheral circulation. Respiratory: Normal respiratory effort.  No retractions. Gastrointestinal: Soft and nontender. No distention. Musculoskeletal: No obvious deformities Neurologic:  Normal speech and language. No gross focal neurologic deficits are appreciated. Skin:  Skin is warm and dry. No rash noted. Psychiatric: Mood and affect are paranoid. Speech and behavior are pressured.  ____________________________________________   LABS (all labs ordered are listed, but only abnormal results are displayed)  Labs Reviewed  COMPREHENSIVE METABOLIC PANEL - Abnormal; Notable for the following components:      Result Value   Glucose, Bld 118 (*)    Calcium 8.5 (*)    All other components within normal limits  URINE DRUG SCREEN, QUALITATIVE ([facility] ONLY) - Abnormal; Notable for the following components:   Amphetamines, Ur Screen POSITIVE (*)    Cocaine  Metabolite,Ur [place] POSITIVE (*)    Cannabinoid 50 Ng, Ur [place] POSITIVE (*)    Benzodiazepine, Ur Scrn POSITIVE (*)    All other components within normal limits  RESP PANEL BY RT-PCR (FLU A&B, COVID) ARPGX2  ETHANOL  CBC    RADIOLOGY  ED MD interpretation:    Official radiology report(s): No results found.  ____________________________________________   PROCEDURES  Procedure(s) performed (including Critical Care):  Procedures   ____________________________________________   INITIAL IMPRESSION / ASSESSMENT AND PLAN / ED COURSE  As part of my medical decision making, I reviewed the following data within the electronic MEDICAL RECORD NUMBER Nursing notes reviewed and incorporated, Labs reviewed, EKG interpreted, Old chart reviewed, Radiograph reviewed and Notes from prior ED visits reviewed and incorporated        Patient presents under IVC for paranoia. Thoughts are disorganized. No history of prior suicide attempt, and no SI or HI at this time. Clinically w/ no overt toxidrome, low suspicion for ingestion given hx  and exam Thoughts unlikely 2/2 anemia, hypothyroidism, infection, or ICH. Patients decision making capacity is compromised and they are unable to perform all ADLs (additionally they are without appropriate caretakers to assist through this deficit).  Consult: Psychiatry to evaluate patient for grave disability Disposition: Pending psychiatric evaluation  Care of this patient will be signed out the oncoming physician.  All pertinent patient formation is conveyed and all questions answered.  All further care and disposition decisions will be made by the oncoming physician.      ____________________________________________   FINAL CLINICAL IMPRESSION(S) / ED DIAGNOSES  Final diagnoses:  Paranoia [facility])     ED Discharge Orders     None        Note:  This document was prepared using Dragon voice recognition software and may include unintentional dictation  errors.    Merwyn Katos, MD 02/28/21 [phone]

## 2021-02-28 NOTE — ED Notes (Signed)
Patient making scene at staff desk. Yelling for suboxene and "staff should know what medicines he takes"

## 2021-02-28 NOTE — ED Notes (Signed)
Pt given belongings and cab voucher by previous RN. Randy Woods has this RN's ascom number to notify once they're here.

## 2021-02-28 NOTE — Consult Note (Signed)
[facility] Psychiatry Consult   Reason for Consult:  Psychiatric Evaluation  Referring Physician:  Dr. Cheri Fowler Patient Identification: Randy Woods. MRN:  [id number] Principal Diagnosis: Substance induced mood disorder (HCC) Diagnosis:  Principal Problem:   Substance induced mood disorder (HCC) Active Problems:   Opiate abuse, continuous (HCC)   Amphetamine abuse ([place])   Total Time spent with patient: 1 hour  Subjective:   Randy Woods. is a 40 y.o. male patient admitted per triage, ems was called to hotel where pt was lethargic and acting strangely.  Ems found month supply of medication strips. Pt admits to meth and gabapentin use. Pt was dc'd from er recently for same occurrence.  HPI:  Per TTS, Patient is a 40 year old male presenting to [facility] ED under IVC. Per triage note Pt presents to ER under IVC reports feeling anxious, per IVC petition pt called [facility] and informed them he was scared and and reported that someone was going to kill him. Pt  denies any SI or HI denies any visual or auditory hallucinations.During assessment patient appeared alert and oriented x3, cooperative, speech is slurred and patient appears to still be under the influence of substances. Patient UDS is positive for Amphetamines, Cocaine, Benzos and Cannabinoids. Patient reports why he is presenting to the ED "Either I think something is happening or I'm seeing people." Patient reports this isn't the first time he has had VH he reports he had VH "a couple of days ago." When asked if he used substances tonight patient denies, patient reports he last used substances "2 days ago." Patient denies SI/HI/AH and does not appear to be responding to any internal or external stimuli.  Based on TTS assessment: Re-evaluation in the am is recommended.   Past Psychiatric History: Polysubstance abuse  Risk to Self:   Risk to Others:   Prior Inpatient Therapy:   Prior Outpatient Therapy:    Past Medical History:   Past Medical History:  Diagnosis Date   Anxiety    Bipolar 1 disorder ([place])    Bronchitis    Cocaine abuse ([place])    Depression    Drug abuse ([place])    Heroin abuse ([place])    Schizophrenia ([place])     Past Surgical History:  Procedure Laterality Date   NOSE SURGERY     TEE WITHOUT CARDIOVERSION N/A 05/10/2016   Procedure: TRANSESOPHAGEAL ECHOCARDIOGRAM (TEE);  Surgeon: Dionisio David, MD;  Location: [facility] ORS;  Service: Cardiovascular;  Laterality: N/A;   TYMPANOPLASTY     Family History:  Family History  Problem Relation Age of Onset   Diabetes Mother    Hypertension Father    Kidney failure Father    Diabetes Father    Family Psychiatric  History: unknown Social History:  Social History   Substance and Sexual Activity  Alcohol Use Yes     Social History   Substance and Sexual Activity  Drug Use Not Currently   Types: IV, Marijuana, Methamphetamines   Comment: heroine    Social History   Socioeconomic History   Marital status: Married    Spouse name: Not on file   Number of children: Not on file   Years of education: Not on file   Highest education level: Not on file  Occupational History   Not on file  Tobacco Use   Smoking status: Every Day    Packs/day: 1.50    Pack years: 0.00    Types: Cigarettes   Smokeless tobacco:  Current    Types: Snuff  Vaping Use   Vaping Use: Never used  Substance and Sexual Activity   Alcohol use: Yes   Drug use: Not Currently    Types: IV, Marijuana, Methamphetamines    Comment: heroine   Sexual activity: Not on file  Other Topics Concern   Not on file  Social History Narrative   Not on file   Social Determinants of Health   Financial Resource Strain: Not on file  Food Insecurity: Not on file  Transportation Needs: Not on file  Physical Activity: Not on file  Stress: Not on file  Social Connections: Not on file   Additional Social History:    Allergies:   Allergies  Allergen Reactions   Bactrim  [Sulfamethoxazole-Trimethoprim]    Sulfa Antibiotics Other (See Comments) and Rash    Pt states "the skin peels off of my penis" Rash to penis    Labs:  Results for orders placed or performed during the hospital encounter of 02/28/21 (from the past 48 hour(s))  Comprehensive metabolic panel     Status: Abnormal   Collection Time: 02/27/21 11:40 PM  Result Value Ref Range   Sodium 136 135 - 145 mmol/L   Potassium 3.6 3.5 - 5.1 mmol/L   Chloride 103 98 - 111 mmol/L   CO2 28 22 - 32 mmol/L   Glucose, Bld 118 (H) 70 - 99 mg/dL    Comment: Glucose reference range applies only to samples taken after fasting for at least 8 hours.   BUN 14 6 - 20 mg/dL   Creatinine, Ser 0.81 0.61 - 1.24 mg/dL   Calcium 8.5 (L) 8.9 - 10.3 mg/dL   Total Protein 7.1 6.5 - 8.1 g/dL   Albumin 3.8 3.5 - 5.0 g/dL   AST 32 15 - 41 U/L   ALT 33 0 - 44 U/L   Alkaline Phosphatase 56 38 - 126 U/L   Total Bilirubin 0.6 0.3 - 1.2 mg/dL   GFR, Estimated >60 >60 mL/min    Comment: (NOTE) Calculated using the CKD-EPI Creatinine Equation (2021)    Anion gap 5 5 - 15    Comment: Performed at [facility], [address] [place], [place] [address]  Ethanol     Status: None   Collection Time: 02/27/21 11:40 PM  Result Value Ref Range   Alcohol, Ethyl (B) <10 <10 mg/dL    Comment: (NOTE) Lowest detectable limit for serum alcohol is 10 mg/dL.  For medical purposes only. Performed at [facility], [address] [place], [place] [address]   cbc     Status: None   Collection Time: 02/27/21 11:40 PM  Result Value Ref Range   WBC 7.4 4.0 - 10.5 K/uL   RBC 4.74 4.22 - 5.81 MIL/uL   Hemoglobin 13.9 13.0 - 17.0 g/dL   HCT 40.8 39.0 - 52.0 %   MCV 86.1 80.0 - 100.0 fL   MCH 29.3 26.0 - 34.0 pg   MCHC 34.1 30.0 - 36.0 g/dL   RDW 12.9 11.5 - 15.5 %   Platelets 156 150 - 400 K/uL   nRBC 0.0 0.0 - 0.2 %    Comment: Performed at [facility], [address], [place], [place] [address]   Urine Drug Screen, Qualitative     Status: Abnormal   Collection Time: 02/27/21 11:40 PM  Result Value Ref Range   Tricyclic, Ur Screen NONE DETECTED NONE DETECTED   Amphetamines, Ur Screen POSITIVE (A) NONE DETECTED  MDMA (Ecstasy)Ur Screen NONE DETECTED NONE DETECTED   Cocaine Metabolite,Ur [place] POSITIVE (A) NONE DETECTED   Opiate, Ur Screen NONE DETECTED NONE DETECTED   Phencyclidine (PCP) Ur S NONE DETECTED NONE DETECTED   Cannabinoid 50 Ng, Ur [place] POSITIVE (A) NONE DETECTED   Barbiturates, Ur Screen NONE DETECTED NONE DETECTED   Benzodiazepine, Ur Scrn POSITIVE (A) NONE DETECTED   Methadone Scn, Ur NONE DETECTED NONE DETECTED    Comment: (NOTE) Tricyclics + metabolites, urine    Cutoff 1000 ng/mL Amphetamines + metabolites, urine  Cutoff 1000 ng/mL MDMA (Ecstasy), urine              Cutoff 500 ng/mL Cocaine Metabolite, urine          Cutoff 300 ng/mL Opiate + metabolites, urine        Cutoff 300 ng/mL Phencyclidine (PCP), urine         Cutoff 25 ng/mL Cannabinoid, urine                 Cutoff 50 ng/mL Barbiturates + metabolites, urine  Cutoff 200 ng/mL Benzodiazepine, urine              Cutoff 200 ng/mL Methadone, urine                   Cutoff 300 ng/mL  The urine drug screen provides only a preliminary, unconfirmed analytical test result and should not be used for non-medical purposes. Clinical consideration and professional judgment should be applied to any positive drug screen result due to possible interfering substances. A more specific alternate chemical method must be used in order to obtain a confirmed analytical result. Gas chromatography / mass spectrometry (GC/MS) is the preferred confirm atory method. Performed at [facility], [place], [place] [address]   Resp Panel by RT-PCR (Flu A&B, Covid) Nasopharyngeal Swab     Status: None   Collection Time: 02/28/21  5:17 AM   Specimen: Nasopharyngeal Swab; Nasopharyngeal(NP) swabs in vial  transport medium  Result Value Ref Range   SARS Coronavirus 2 by RT PCR NEGATIVE NEGATIVE    Comment: (NOTE) SARS-CoV-2 target nucleic acids are NOT DETECTED.  The SARS-CoV-2 RNA is generally detectable in upper respiratory specimens during the acute phase of infection. The lowest concentration of SARS-CoV-2 viral copies this assay can detect is 138 copies/mL. A negative result does not preclude SARS-Cov-2 infection and should not be used as the sole basis for treatment or other patient management decisions. A negative result may occur with  improper specimen collection/handling, submission of specimen other than nasopharyngeal swab, presence of viral mutation(s) within the areas targeted by this assay, and inadequate number of viral copies(<138 copies/mL). A negative result must be combined with clinical observations, patient history, and epidemiological information. The expected result is Negative.  Fact Sheet for Patients:  EntrepreneurPulse.com.au  Fact Sheet for Healthcare Providers:  IncredibleEmployment.be  This test is no t yet approved or cleared by the [place] FDA and  has been authorized for detection and/or diagnosis of SARS-CoV-2 by FDA under an Emergency Use Authorization (EUA). This EUA will remain  in effect (meaning this test can be used) for the duration of the COVID-19 declaration under Section 564(b)(1) of the Act, 21 U.S.C.section 360bbb-3(b)(1), unless the authorization is terminated  or revoked sooner.       Influenza A by PCR NEGATIVE NEGATIVE   Influenza B by PCR NEGATIVE NEGATIVE    Comment: (NOTE) The Xpert Xpress SARS-CoV-2/FLU/RSV plus assay  is intended as an aid in the diagnosis of influenza from Nasopharyngeal swab specimens and should not be used as a sole basis for treatment. Nasal washings and aspirates are unacceptable for Xpert Xpress SARS-CoV-2/FLU/RSV testing.  Fact Sheet for  Patients: EntrepreneurPulse.com.au  Fact Sheet for Healthcare Providers: IncredibleEmployment.be  This test is not yet approved or cleared by the [place] FDA and has been authorized for detection and/or diagnosis of SARS-CoV-2 by FDA under an Emergency Use Authorization (EUA). This EUA will remain in effect (meaning this test can be used) for the duration of the COVID-19 declaration under Section 564(b)(1) of the Act, 21 U.S.C. section 360bbb-3(b)(1), unless the authorization is terminated or revoked.  Performed at [facility], [address], [place], [place] [address]     Current Facility-Administered Medications  Medication Dose Route Frequency Provider Last Rate Last Admin   clonazePAM (KLONOPIN) tablet 1 mg  1 mg Oral BID Naaman Plummer, MD       Current Outpatient Medications  Medication Sig Dispense Refill   amphetamine-dextroamphetamine (ADDERALL) 30 MG tablet Take 30 mg by mouth 2 (two) times daily.   0   Buprenorphine HCl-Naloxone HCl (SUBOXONE) 8-2 MG FILM Place 1 Film under the tongue 3 (three) times daily.      clonazePAM (KLONOPIN) 1 MG tablet Take 1 mg by mouth 2 (two) times daily.  0   gabapentin (NEURONTIN) 400 MG capsule Take 400 mg by mouth 3 (three) times daily.     lurasidone (LATUDA) 40 MG TABS tablet Take 40 mg by mouth daily with breakfast.     naloxone (NARCAN) nasal spray 4 mg/0.1 mL Keep accessible in case of overdose. 1 kit 0     Review of Systems  Psychiatric/Behavioral:  Positive for substance abuse.   All other systems reviewed and are negative. Blood pressure (!) 127/92, pulse 97, temperature 98.1 F (36.7 C), temperature source Oral, resp. rate 18, height 5' 8" (1.727 m), weight 107 kg, SpO2 97 %. Body mass index is 35.88 kg/m.    Disposition:  patient denies SI and HI. Reassessment in the am after substance metabolization.   Deloria Lair, NP 02/28/2021 6:14 AM

## 2021-02-28 NOTE — ED Notes (Signed)
Hourly rounding performed, patient currently awake in restroom. Patient has no complaints at this time. Q15 minute rounds and monitoring via Rover and Officer to continue. 

## 2021-02-28 NOTE — BH Assessment (Signed)
Comprehensive Clinical Assessment (CCA) Note  02/28/2021 Randy FloodRichard L Booher [place]. [id number]  Chief Complaint: Patient is a 40 year old male presenting to [facility] ED under IVC. Per triage note Pt presents to ER under IVC reports feeling anxious, per IVC petition pt called [facility] and informed them he was scared and and reported that someone was going to kill him. Pt  denies any SI or HI denies any visual or auditory hallucinations.During assessment patient appeared alert and oriented x3, cooperative, speech is slurred and patient appears to still be under the influence of substances. Patient UDS is positive for Amphetamines, Cocaine, Benzos and Cannabinoids. Patient reports why he is presenting to the ED "Either I think something is happening or I'm seeing people." Patient reports this isn't the first time he has had VH he reports he had VH "a couple of days ago." When asked if he used substances tonight patient denies, patient reports he last used substances "2 days ago." Patient denies SI/HI/AH and does not appear to be responding to any internal or external stimuli.   Chief Complaint  Patient presents with   Addiction Problem   Visit Diagnosis: Polysubstance Use, Substance-Induced Psychosis    CCA Screening, Triage and Referral (STR)  Patient Reported Information How did you hear about us? Legal System  Referral name: No data recorded Referral phone number: No data recorded  Whom do you see for routine medical problems? No data recorded Practice/Facility Name: No data recorded Practice/Facility Phone Number: No data recorded Name of Contact: No data recorded Contact Number: No data recorded Contact Fax Number: No data recorded Prescriber Name: No data recorded Prescriber Address (if known): No data recorded  What Is the Reason for Your Visit/Call Today? Patient presents under IVC after reporting VH  How Long Has This Been Causing You Problems? > than 6 months  What Do You Feel Would Help You  the Most Today? Alcohol or Drug Use Treatment   Have You Recently Been in Any Inpatient Treatment (Hospital/Detox/Crisis Center/28-Day Program)? No  Name/Location of Program/Hospital:No data recorded How Long Were You There? No data recorded When Were You Discharged? No data recorded  Have You Ever Received Services From [facility] Before? Yes  Who Do You See at [facility]? Medical and Mental Health   Have You Recently Had Any Thoughts About Hurting Yourself? No  Are You Planning to Commit Suicide/Harm Yourself At This time? No   Have you Recently Had Thoughts About Hurting Someone Karolee Ohslse? No  Explanation: No data recorded  Have You Used Any Alcohol or Drugs in the Past 24 Hours? Yes  How Long Ago Did You Use Drugs or Alcohol? No data recorded What Did You Use and How Much? Amphetamines, Cocaine, Marijuana, Benzos   Do You Currently Have a Therapist/Psychiatrist? No  Name of Therapist/Psychiatrist: No data recorded  Have You Been Recently Discharged From Any Office Practice or Programs? No  Explanation of Discharge From Practice/Program: No data recorded    CCA Screening Triage Referral Assessment Type of Contact: Face-to-Face  Is this Initial or Reassessment? No data recorded Date Telepsych consult ordered in [facility]:  No data recorded Time Telepsych consult ordered in [facility]:  No data recorded  Patient Reported Information Reviewed? Yes  Patient Left Without Being Seen? No data recorded Reason for Not Completing Assessment: No data recorded  Collateral Involvement: None provided   Does Patient Have a Court Appointed Legal Guardian? No data recorded Name and Contact of Legal Guardian: No data recorded If Minor  and Not Living with Parent(s), Who has Custody? n/a  Is CPS involved or ever been involved? Never  Is APS involved or ever been involved? Never   Patient Determined To Be At Risk for Harm To Self or Others Based on Review of Patient Reported Information  or Presenting Complaint? No  Method: No data recorded Availability of Means: No data recorded Intent: No data recorded Notification Required: No data recorded Additional Information for Danger to Others Potential: No data recorded Additional Comments for Danger to Others Potential: No data recorded Are There Guns or Other Weapons in Your Home? No data recorded Types of Guns/Weapons: No data recorded Are These Weapons Safely Secured?                            No data recorded Who Could Verify You Are Able To Have These Secured: No data recorded Do You Have any Outstanding Charges, Pending Court Dates, Parole/Probation? No data recorded Contacted To Inform of Risk of Harm To Self or Others: No data recorded  Location of Assessment: [facility] ED   Does Patient Present under Involuntary Commitment? Yes  IVC Papers Initial File Date: 02/28/21   [place] of Residence: [place]   Patient Currently Receiving the Following Services: Not Receiving Services   Determination of Need: Emergent (2 hours)   Options For Referral: Outpatient Therapy     CCA Biopsychosocial Intake/Chief Complaint:  No data recorded Current Symptoms/Problems: No data recorded  Patient Reported Schizophrenia/Schizoaffective Diagnosis in Past: No   Strengths: Patient is able to communicate and take care of himself  Preferences: No data recorded Abilities: No data recorded  Type of Services Patient Feels are Needed: No data recorded  Initial Clinical Notes/Concerns: No data recorded  Mental Health Symptoms Depression:   None   Duration of Depressive symptoms: No data recorded  Mania:   None   Anxiety:    None   Psychosis:   None   Duration of Psychotic symptoms: No data recorded  Trauma:   None   Obsessions:   None   Compulsions:   None   Inattention:   None   Hyperactivity/Impulsivity:   None   Oppositional/Defiant Behaviors:   None   Emotional Irregularity:   None   Other  Mood/Personality Symptoms:  No data recorded   Mental Status Exam Appearance and self-care  Stature:   Average   Weight:   Overweight   Clothing:   Disheveled   Grooming:   Neglected   Cosmetic use:   None   Posture/gait:   Normal   Motor activity:   Not Remarkable   Sensorium  Attention:   Normal   Concentration:   Normal   Orientation:   Person; Place; Time; Object   Recall/memory:   Normal   Affect and Mood  Affect:   Appropriate   Mood:   Other (Comment)   Relating  Eye contact:   Normal   Facial expression:   Responsive   Attitude toward examiner:   Cooperative   Thought and Language  Speech flow:  Slurred   Thought content:   Appropriate to Mood and Circumstances   Preoccupation:   None   Hallucinations:   Visual   Organization:  No data recorded  [employer] of Knowledge:   Fair   Intelligence:   Average   Abstraction:   Normal   Judgement:   Poor   Reality Testing:   Distorted  Insight:   Poor   Decision Making:   Normal   Social Functioning  Social Maturity:   Responsible   Social Judgement:   Normal   Stress  Stressors:   Other (Comment)   Coping Ability:   Normal   Skill Deficits:   None   Supports:   Support needed     Religion: Religion/Spirituality Are You A Religious Person?: No  Leisure/Recreation: Leisure / Recreation Do You Have Hobbies?: No  Exercise/Diet: Exercise/Diet Do You Exercise?: No Have You Gained or Lost A Significant Amount of Weight in the Past Six Months?: No Do You Follow a Special Diet?: No Do You Have Any Trouble Sleeping?: No   CCA Employment/Education Employment/Work Situation: Employment / Work Situation Employment Situation: Unemployed Patient's Job has Been Impacted by Current Illness: No Has Patient ever Been in Equities trader?: No  Education: Education Did You Have An Individualized Education Program (IIEP): No Did You Have  Any Difficulty At [employer]?: No Patient's Education Has Been Impacted by Current Illness: No   CCA Family/Childhood History Family and Relationship History: Family history Marital status: Married What types of issues is patient dealing with in the relationship?: Unknown Additional relationship information: Unknown Does patient have children?: No  Childhood History:  Childhood History Did patient suffer any verbal/emotional/physical/sexual abuse as a child?: No Did patient suffer from severe childhood neglect?: No Has patient ever been sexually abused/assaulted/raped as an adolescent or adult?: No Was the patient ever a victim of a crime or a disaster?: No Witnessed domestic violence?: No Has patient been affected by domestic violence as an adult?: No  Child/Adolescent Assessment:     CCA Substance Use Alcohol/Drug Use: Alcohol / Drug Use Pain Medications: See MAR Prescriptions: See MAR Over the Counter: See MAR History of alcohol / drug use?: Yes Substance #1 Name of Substance 1: Amphetamines 1 - Last Use / Amount: "2 days ago" Substance #2 Name of Substance 2: Cocaine Substance #3 Name of Substance 3: Marijuana                   ASAM's:  Six Dimensions of Multidimensional Assessment  Dimension 1:  Acute Intoxication and/or Withdrawal Potential:      Dimension 2:  Biomedical Conditions and Complications:      Dimension 3:  Emotional, Behavioral, or Cognitive Conditions and Complications:     Dimension 4:  Readiness to Change:     Dimension 5:  Relapse, Continued use, or Continued Problem Potential:     Dimension 6:  Recovery/Living Environment:     ASAM Severity Score:    ASAM Recommended Level of Treatment:     Substance use Disorder (SUD) Substance Use Disorder (SUD)  Checklist Symptoms of Substance Use: Continued use despite having a persistent/recurrent physical/psychological problem caused/exacerbated by use, Evidence of tolerance, Presence of  craving or strong urge to use, Social, occupational, recreational activities given up or reduced due to use, Recurrent use that results in a failure to fulfill major role obligations (work, school, home), Substance(s) often taken in larger amounts or over longer times than was intended, Large amounts of time spent to obtain, use or recover from the substance(s), Continued use despite persistent or recurrent social, interpersonal problems, caused or exacerbated by use, Persistent desire or unsuccessful efforts to cut down or control use, Repeated use in physically hazardous situations  Recommendations for Services/Supports/Treatments:  Patient to be evaluated by Psychiatry later this morning  DSM5 Diagnoses: Patient Active Problem List   Diagnosis Date Noted  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/17/2021   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   Facial cellulitis 07/22/2020   Opiate abuse, episodic (HCC) 02/07/2017   Substance induced mood disorder (HCC) 02/07/2017   Opiate overdose (HCC) 11/12/2016   Bipolar 1 disorder, mixed, moderate (HCC) 05/07/2016   Staphylococcal sepsis (HCC) 05/06/2016   Severe recurrent major depression without psychotic features (HCC) 05/22/2015   Opiate abuse, continuous (HCC) 05/22/2015   Cocaine abuse (HCC) 05/22/2015    Patient Centered Plan: Patient is on the following Treatment Plan(s):  Substance Abuse   Referrals to Alternative Service(s): Referred to Alternative Service(s):   Place:   Date:   Time:    Referred to Alternative Service(s):   Place:   Date:   Time:    Referred to Alternative Service(s):   Place:   Date:   Time:    Referred to Alternative Service(s):   Place:   Date:   Time:     Keeleigh Terris A Joaopedro Eschbach, LCAS-A

## 2021-02-28 NOTE — Discharge Instructions (Addendum)
Follow up with outpatient services 

## 2021-02-28 NOTE — ED Notes (Signed)
Pt awake, covid collected. Pt provided with snacks and drink at this time.

## 2021-02-28 NOTE — ED Notes (Signed)
Pt updated about cab.

## 2021-02-28 NOTE — Consult Note (Signed)
[facility] Psych ED Discharge  02/28/2021 11:19 AM [place].  MRN:  [id number]  Method of visit?: Face to Face   Principal Problem: Amphetamine and psychostimulant-induced psychosis with delusions [facility]) Discharge Diagnoses: Principal Problem:   Amphetamine and psychostimulant-induced psychosis with delusions ([place]) Active Problems:   Opiate abuse, continuous ([place])   Substance induced mood disorder ([place])   Amphetamine abuse ([place])   Subjective: "Glad you know I'm not crazy as hell."  40 yo male who returned to the ED after discharging from using meth and cleared.  He reports  he walked to the gas station and was awaiting his "baby momma" to pick him up.  The gas station attendant called the police and they brought him to the ED.  He denies using any substances since discharging.  Clear and coherent on assessment with no suicidal/homicidal ideations, hallucinations, or withdrawal symptoms.  Psychiatrically stable for discharge, asked the client to please not leave until his ride is in the lobby.  Recommend follow up with rehab, information provided.   Total Time spent with patient: 45 minutes  Past Psychiatric History: substance abuse, bipolar disorder, anxiety  Past Medical History:  Past Medical History:  Diagnosis Date   Anxiety    Bipolar 1 disorder ([place])    Bronchitis    Cocaine abuse ([place])    Depression    Drug abuse ([place])    Heroin abuse ([place])    Schizophrenia ([place])     Past Surgical History:  Procedure Laterality Date   NOSE SURGERY     TEE WITHOUT CARDIOVERSION N/A 05/10/2016   Procedure: TRANSESOPHAGEAL ECHOCARDIOGRAM (TEE);  Surgeon: Dionisio David, MD;  Location: [facility] ORS;  Service: Cardiovascular;  Laterality: N/A;   TYMPANOPLASTY     Family History:  Family History  Problem Relation Age of Onset   Diabetes Mother    Hypertension Father    Kidney failure Father    Diabetes Father    Family Psychiatric  History: none Social History:  Social History    Substance and Sexual Activity  Alcohol Use Yes     Social History   Substance and Sexual Activity  Drug Use Not Currently   Types: IV, Marijuana, Methamphetamines   Comment: heroine    Social History   Socioeconomic History   Marital status: Married    Spouse name: Not on file   Number of children: Not on file   Years of education: Not on file   Highest education level: Not on file  Occupational History   Not on file  Tobacco Use   Smoking status: Every Day    Packs/day: 1.50    Pack years: 0.00    Types: Cigarettes   Smokeless tobacco: Current    Types: Snuff  Vaping Use   Vaping Use: Never used  Substance and Sexual Activity   Alcohol use: Yes   Drug use: Not Currently    Types: IV, Marijuana, Methamphetamines    Comment: heroine   Sexual activity: Not on file  Other Topics Concern   Not on file  Social History Narrative   Not on file   Social Determinants of Health   Financial Resource Strain: Not on file  Food Insecurity: Not on file  Transportation Needs: Not on file  Physical Activity: Not on file  Stress: Not on file  Social Connections: Not on file    Tobacco Cessation:  A prescription for an FDA-approved tobacco cessation medication was offered at discharge and the patient refused  Current Medications: Current Facility-Administered Medications  Medication Dose Route Frequency Provider Last Rate Last Admin   buprenorphine-naloxone (SUBOXONE) 8-2 mg per SL tablet 1 tablet  1 tablet Sublingual Daily Patrecia Pour, NP   1 tablet at 02/28/21 1047   Current Outpatient Medications  Medication Sig Dispense Refill   amphetamine-dextroamphetamine (ADDERALL) 30 MG tablet Take 30 mg by mouth 2 (two) times daily.   0   Buprenorphine HCl-Naloxone HCl (SUBOXONE) 8-2 MG FILM Place 1 Film under the tongue 3 (three) times daily.      clonazePAM (KLONOPIN) 1 MG tablet Take 1 mg by mouth 2 (two) times daily.  0   gabapentin (NEURONTIN) 400 MG capsule Take 400  mg by mouth 3 (three) times daily.     lurasidone (LATUDA) 40 MG TABS tablet Take 40 mg by mouth daily with breakfast.     naloxone (NARCAN) nasal spray 4 mg/0.1 mL Keep accessible in case of overdose. 1 kit 0   PTA Medications: (Not in a hospital admission)   Musculoskeletal: Strength & Muscle Tone: within normal limits Gait & Station: normal Patient leans: N/A  Psychiatric Specialty Exam: Physical Exam Vitals and nursing note reviewed.  Constitutional:      Appearance: Normal appearance.  HENT:     Head: Normocephalic.     Nose: Nose normal.  Pulmonary:     Effort: Pulmonary effort is normal.  Musculoskeletal:        General: Normal range of motion.     Cervical back: Normal range of motion.  Neurological:     General: No focal deficit present.     Mental Status: He is alert and oriented to person, place, and time.  Psychiatric:        Attention and Perception: Attention and perception normal.        Mood and Affect: Mood is anxious.        Speech: Speech normal.        Behavior: Behavior normal. Behavior is cooperative.        Thought Content: Thought content normal.        Cognition and Memory: Cognition and memory normal.        Judgment: Judgment normal.    Review of Systems  Psychiatric/Behavioral:  Positive for substance abuse. The patient is nervous/anxious.   All other systems reviewed and are negative.  Blood pressure 116/76, pulse 74, temperature 98.5 F (36.9 C), temperature source Oral, resp. rate 16, height _0  (1.727 m), weight 107 kg, SpO2 100 %.Body mass index is 35.88 kg/m.  General Appearance: Disheveled  Eye Contact:  Good  Speech:  Clear and Coherent and Normal Rate  Volume:  Normal  Mood:  Anxious  Affect:  Congruent  Thought Process:  Coherent and Linear  Orientation:  Full (Time, Place, and Person)  Thought Content:  WDL and Logical  Suicidal Thoughts:  No  Homicidal Thoughts:  No  Memory:  Immediate;   Fair Recent;   Fair Remote;    Fair  Judgement:  Fair  Insight:  Fair  Psychomotor Activity:  Normal  Concentration:  Concentration: Fair and Attention Span: Fair  Recall:  [employer] of Knowledge:  Fair  Language:  Good  Akathisia:  No  Handed:  Right  AIMS (if indicated):     Assets:  Housing Leisure Time Physical Health Resilience Social Support  ADL's:  Intact  Cognition:  WNL  Sleep:         Physical Exam: Physical Exam Vitals  and nursing note reviewed.  Constitutional:      Appearance: Normal appearance.  HENT:     Head: Normocephalic.     Nose: Nose normal.  Pulmonary:     Effort: Pulmonary effort is normal.  Musculoskeletal:        General: Normal range of motion.     Cervical back: Normal range of motion.  Neurological:     General: No focal deficit present.     Mental Status: He is alert and oriented to person, place, and time.  Psychiatric:        Attention and Perception: Attention and perception normal.        Mood and Affect: Mood is anxious.        Speech: Speech normal.        Behavior: Behavior normal. Behavior is cooperative.        Thought Content: Thought content normal.        Cognition and Memory: Cognition and memory normal.        Judgment: Judgment normal.   Review of Systems  Psychiatric/Behavioral:  Positive for substance abuse. The patient is nervous/anxious.   All other systems reviewed and are negative. Blood pressure 116/76, pulse 74, temperature 98.5 F (36.9 C), temperature source Oral, resp. rate 16, height _0  (1.727 m), weight 107 kg, SpO2 100 %. Body mass index is 35.88 kg/m.   Demographic Factors:  Male and Caucasian  Loss Factors: NA  Historical Factors: NA  Risk Reduction Factors:   Sense of responsibility to family, Living with another person, especially a relative, and Positive social support  Continued Clinical Symptoms:  Anxiety, mild  Cognitive Features That Contribute To Risk:  None    Suicide Risk:  Minimal: No identifiable  suicidal ideation.  Patients presenting with no risk factors but with morbid ruminations; may be classified as minimal risk based on the severity of the depressive symptoms    Plan Of Care/Follow-up recommendations:  Stimulant use disorder: -Refrain from alcohol and drug use -Attend 12-step program with a sponsor -Follow up with [facility] Activity:  as tolerated  Diet:  heart healthy diet  Disposition: discharge home Waylan Boga, NP 02/28/2021, 11:19 AM

## 2021-02-28 NOTE — ED Provider Notes (Signed)
-----------------------------------------   11:20 AM on 02/28/2021 -----------------------------------------  The patient has been evaluated by NP Lord from psychiatry who has rescinded the IVC and cleared the patient for discharge.  She was waiting for the patient to have a ride so that he could actually get home since after his last discharge she just went to the gas station up the street and was brought back to the ED.  The patient now has a ride on the way and a safe discharge plan.  He is alert and oriented, ambulating with steady gait, calm and cooperative, and denies SI or HI.  He does not demonstrate any acute danger to self or others.  He is stable for discharge at this time.  Return precautions given, and he expresses understanding.   Dionne Bucy, MD 02/28/21 1121

## 2021-02-28 NOTE — ED Notes (Signed)
Pt reports need for his klonopin 1 mg that he takes bid. Dr. Vicente Males notified, order to follow.

## 2021-02-28 NOTE — BH Assessment (Signed)
Writer spoke with the patient to complete an updated/reassessment. Patient denies SI/HI and AV/H. 

## 2021-03-13 ENCOUNTER — Emergency Department
Admission: EM | Admit: 2021-03-13 | Discharge: 2021-03-13 | Disposition: A | Discharge status: Home / Self Care | Payer: Medicaid Other | Attending: Emergency Medicine | Admitting: Emergency Medicine

## 2021-03-13 ENCOUNTER — Other Ambulatory Visit: Payer: Self-pay

## 2021-03-13 DIAGNOSIS — R509 Fever, unspecified: Secondary | ICD-10-CM | POA: Diagnosis not present

## 2021-03-13 DIAGNOSIS — F17210 Nicotine dependence, cigarettes, uncomplicated: Secondary | ICD-10-CM | POA: Insufficient documentation

## 2021-03-13 DIAGNOSIS — R2232 Localized swelling, mass and lump, left upper limb: Secondary | ICD-10-CM | POA: Diagnosis present

## 2021-03-13 DIAGNOSIS — L03311 Cellulitis of abdominal wall: Secondary | ICD-10-CM | POA: Diagnosis not present

## 2021-03-13 DIAGNOSIS — L02412 Cutaneous abscess of left axilla: Secondary | ICD-10-CM

## 2021-03-13 DIAGNOSIS — L03112 Cellulitis of left axilla: Secondary | ICD-10-CM | POA: Diagnosis not present

## 2021-03-13 DIAGNOSIS — L02411 Cutaneous abscess of right axilla: Secondary | ICD-10-CM

## 2021-03-13 LAB — CBC WITH DIFFERENTIAL/PLATELET
Abs Immature Granulocytes: 0.03 10*3/uL (ref 0.00–0.07)
Basophils Absolute: 0.1 10*3/uL (ref 0.0–0.1)
Basophils Relative: 1 %
Eosinophils Absolute: 0.2 10*3/uL (ref 0.0–0.5)
Eosinophils Relative: 2 %
HCT: 39.1 % (ref 39.0–52.0)
Hemoglobin: 13.6 g/dL (ref 13.0–17.0)
Immature Granulocytes: 0 %
Lymphocytes Relative: 27 %
Lymphs Abs: 2.6 10*3/uL (ref 0.7–4.0)
MCH: 29.8 pg (ref 26.0–34.0)
MCHC: 34.8 g/dL (ref 30.0–36.0)
MCV: 85.7 fL (ref 80.0–100.0)
Monocytes Absolute: 0.5 10*3/uL (ref 0.1–1.0)
Monocytes Relative: 5 %
Neutro Abs: 6.3 10*3/uL (ref 1.7–7.7)
Neutrophils Relative %: 65 %
Platelets: 181 10*3/uL (ref 150–400)
RBC: 4.56 MIL/uL (ref 4.22–5.81)
RDW: 13.2 % (ref 11.5–15.5)
WBC: 9.6 10*3/uL (ref 4.0–10.5)
nRBC: 0 % (ref 0.0–0.2)

## 2021-03-13 LAB — COMPREHENSIVE METABOLIC PANEL
ALT: 22 U/L (ref 0–44)
AST: 23 U/L (ref 15–41)
Albumin: 3.6 g/dL (ref 3.5–5.0)
Alkaline Phosphatase: 49 U/L (ref 38–126)
Anion gap: 6 (ref 5–15)
BUN: 11 mg/dL (ref 6–20)
CO2: 26 mmol/L (ref 22–32)
Calcium: 8 mg/dL — ABNORMAL LOW (ref 8.9–10.3)
Chloride: 106 mmol/L (ref 98–111)
Creatinine, Ser: 0.75 mg/dL (ref 0.61–1.24)
GFR, Estimated: >60 mL/min (ref >60)
Glucose, Bld: 107 mg/dL — ABNORMAL HIGH (ref 70–99)
Potassium: 3.1 mmol/L — ABNORMAL LOW (ref 3.5–5.1)
Sodium: 138 mmol/L (ref 135–145)
Total Bilirubin: 0.7 mg/dL (ref 0.3–1.2)
Total Protein: 6.8 g/dL (ref 6.5–8.1)

## 2021-03-13 LAB — URINALYSIS, COMPLETE (UACMP) WITH MICROSCOPIC
Bacteria, UA: NONE SEEN
Bilirubin Urine: NEGATIVE
Glucose, UA: NEGATIVE mg/dL
Hgb urine dipstick: NEGATIVE
Ketones, ur: NEGATIVE mg/dL
Leukocytes,Ua: NEGATIVE
Nitrite: NEGATIVE
Protein, ur: NEGATIVE mg/dL
Specific Gravity, Urine: 1.027 (ref 1.005–1.030)
pH: 5 (ref 5.0–8.0)

## 2021-03-13 LAB — LACTIC ACID, PLASMA: Lactic Acid, Venous: 2 mmol/L (ref 0.5–1.9)

## 2021-03-13 MED ORDER — DOXYCYCLINE HYCLATE 50 MG PO CAPS: 100.0000 mg | ORAL_CAPSULE | Freq: Two times a day (BID) | ORAL | 0 refills | Status: AC

## 2021-03-13 MED ORDER — DOXYCYCLINE HYCLATE 50 MG PO CAPS: 100.0000 mg | ORAL_CAPSULE | Freq: Two times a day (BID) | ORAL | 0 refills | Status: DC

## 2021-03-13 MED ORDER — LIDOCAINE-EPINEPHRINE 2 %-1:100000 IJ SOLN
20.0000 mL | Freq: Once | INTRAMUSCULAR | Status: AC
  Administered 2021-03-13: 20 mL via INTRADERMAL
  Filled 2021-03-13: qty 1

## 2021-03-13 NOTE — ED Triage Notes (Signed)
Pt c/o multiple abscesses BL axillary, abd. States he is an IV drug user states they have been there for a few days.

## 2021-03-13 NOTE — ED Provider Notes (Signed)
[facility] Emergency Department Provider Note   ____________________________________________   Event Date/Time   First MD Initiated Contact with Patient 03/13/21 1534     (approximate)  I have reviewed the triage vital signs and the nursing notes.   HISTORY  Chief Complaint Abscess    HPI Randy Woods. is a 40 y.o. male who presents for swelling, inflammation, and fluctuance to bilateral axilla and one spot on his right lower quadrant abdominal wall  LOCATION: Bilateral axilla and right lower quadrant abdominal wall DURATION: 3 days TIMING: Worsening since onset SEVERITY: 5/10 QUALITY: Aching/throbbing CONTEXT: Patient is an IV drug user and has had multiple injection site abscesses in the past MODIFYING FACTORS: Tender to palpation.  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   Facial cellulitis 07/22/2020   Opiate abuse, episodic (HCC) 02/07/2017   Substance induced mood disorder (HCC) 02/07/2017   Opiate overdose (HCC) 11/12/2016   Staphylococcal sepsis (HCC) 05/06/2016   Opiate abuse, continuous (HCC) 05/22/2015   Cocaine abuse (HCC) 05/22/2015    Past Surgical History:  Procedure Laterality Date   NOSE SURGERY     TEE WITHOUT CARDIOVERSION N/A 05/10/2016   Procedure: TRANSESOPHAGEAL ECHOCARDIOGRAM (TEE);  Surgeon: [name], MD;  Location: [facility] ORS;  Service: Cardiovascular;  Laterality: N/A;   TYMPANOPLASTY       Prior to Admission medications   Medication Sig Start Date End Date Taking? Authorizing Provider  doxycycline (VIBRAMYCIN) 50 MG capsule Take 2 capsules (100 mg total) by mouth 2 (two) times daily for 5 days. 03/13/21 03/18/21 Yes Merwyn Katos, MD  Buprenorphine HCl-Naloxone HCl (SUBOXONE) 8-2 MG FILM Place 1 Film under the tongue 3 (three) times daily.     [provider]  naloxone [facility]) nasal spray 4 mg/0.1 mL Keep accessible in case of overdose. 01/24/19   Sharman Cheek, MD  amphetamine-dextroamphetamine (ADDERALL) 30 MG tablet Take 30 mg by mouth 2 (two) times daily.   02/28/21  [provider]  clonazePAM (KLONOPIN) 1 MG tablet Take 1 mg by mouth 2 (two) times daily.  02/28/21  [provider]  gabapentin (NEURONTIN) 400 MG capsule Take 400 mg by mouth 3 (three) times daily.  02/28/21  [provider]  lurasidone (LATUDA) 40 MG TABS tablet Take 40 mg by mouth daily with breakfast.  02/28/21  [provider]    Allergies Bactrim [sulfamethoxazole-trimethoprim] and Sulfa antibiotics  Family History  Problem Relation Age of Onset   Diabetes Mother    Hypertension Father    Kidney failure Father    Diabetes Father     Social History Social History   Tobacco Use   Smoking status: Every Day    Packs/day: 1.50    Pack years: 0.00    Types: Cigarettes   Smokeless tobacco: Current    Types: Snuff  Vaping Use   Vaping Use:  Never used  Substance Use Topics   Alcohol use: Yes   Drug use: Not Currently    Types: IV, Marijuana, Methamphetamines    Comment: heroine    Review of Systems Constitutional: No fever/chills Eyes: No visual changes. ENT: No sore throat. Cardiovascular: Denies chest pain. Respiratory: Denies shortness of breath. Gastrointestinal: No abdominal pain.  No nausea, no vomiting.  No diarrhea. Genitourinary: Negative for dysuria. Musculoskeletal: Negative for acute arthralgias Skin: Erythema, induration, and  swelling to bilateral axilla as well as right lower quadrant abdominal wall Neurological: Negative for headaches, weakness/numbness/paresthesias in any extremity Psychiatric: Negative for suicidal ideation/homicidal ideation   ____________________________________________   PHYSICAL EXAM:  VITAL SIGNS: ED Triage Vitals  Enc Vitals Group     BP 03/13/21 1413 111/71     Pulse Rate 03/13/21 1413 86     Resp 03/13/21 1413 17     Temp 03/13/21 1413 98.2 F (36.8 C)     Temp Source 03/13/21 1413 Oral     SpO2 03/13/21 1413 97 %     Weight 03/13/21 1414 230 lb (104.3 kg)     Height 03/13/21 1414 5\' 9"  (1.753 m)     Head Circumference --      Peak Flow --      Pain Score 03/13/21 1414 5     Pain Loc --      Pain Edu? --      Excl. in GC? --    Constitutional: Somnolent but easily arousable. Well appearing and in no acute distress. Eyes: Conjunctivae are normal. PERRL. Head: Atraumatic. Nose: No congestion/rhinnorhea. Mouth/Throat: Mucous membranes are moist. Neck: No stridor Cardiovascular: Grossly normal heart sounds.  Good peripheral circulation. Respiratory: Normal respiratory effort.  No retractions. Gastrointestinal: Soft and nontender. No distention. Musculoskeletal: No obvious deformities Neurologic:  Normal speech and language. No gross focal neurologic deficits are appreciated. Skin:  Skin is warm and dry. No rash noted.  4 cm x 8 cm area of erythema, induration, and swelling to the medial portion of the proximal left humerus in the axilla.  4 cm x 4 cm area of erythema, induration, swelling, and fluctuance to the left upper axilla on the trunk.  2 cm x 4 cm area of erythema, induration, and swelling in the medial proximal right humerus in the axillary area without any fluctuance.  Small punctate area of erythema, induration, and tenderness to palpation in the right lower quadrant abdominal area Bedside ultrasound of these multiple sites show cobblestoning to every site  except the left axillary trunk that shows an area of fluid collection Psychiatric: Mood and affect are normal. Speech and behavior are normal.  ____________________________________________   LABS (all labs ordered are listed, but only abnormal results are displayed)  Labs Reviewed  LACTIC ACID, PLASMA - Abnormal; Notable for the following components:      Result Value   Lactic Acid, Venous 2.0 (*)    All other components within normal limits  COMPREHENSIVE METABOLIC PANEL - Abnormal; Notable for the following components:   Potassium 3.1 (*)    Glucose, Bld 107 (*)    Calcium 8.0 (*)    All other components within normal limits  URINALYSIS, COMPLETE (UACMP) WITH MICROSCOPIC - Abnormal; Notable for the following components:   Color, Urine YELLOW (*)    APPearance CLOUDY (*)    All other components within normal limits  CBC WITH DIFFERENTIAL/PLATELET  LACTIC ACID, PLASMA     PROCEDURES  Procedure(s) performed (including Critical Care):  09/01/22Marland KitchenIncision  and Drainage  Date/Time: 03/13/2021 4:54 PM Performed by: Merwyn Katos, MD Authorized by: Merwyn Katos, MD   Consent:    Consent obtained:  Verbal   Consent given by:  Patient   Risks discussed:  Bleeding, incomplete drainage and infection   Alternatives discussed:  No treatment and alternative treatment Universal protocol:    Immediately prior to procedure, a time out was called: yes     Patient identity confirmed:  Verbally with patient Location:    Type:  Abscess   Size:  4cm x 4cm   Location:  Trunk   Trunk location:  Chest Pre-procedure details:    Skin preparation:  Povidone-iodine Sedation:    Sedation type:  None Anesthesia:    Anesthesia method:  Local infiltration   Local anesthetic:  Lidocaine 2% WITH epi Procedure type:    Complexity:  Complex Procedure details:    Ultrasound guidance: yes     Incision types:  Single straight   Incision depth:  Submucosal   Wound management:  Probed and deloculated  and irrigated with saline   Drainage:  Bloody and purulent   Drainage amount:  Moderate   Wound treatment:  Wound left open   Packing materials:  1/2 in iodoform gauze   Amount 1/2" iodoform:  4cm Post-procedure details:    Procedure completion:  Tolerated well, no immediate complications   ____________________________________________   INITIAL IMPRESSION / ASSESSMENT AND PLAN / ED COURSE  As part of my medical decision making, I reviewed the following data within the electronic medical record, if available:  Nursing notes reviewed and incorporated, Labs reviewed, EKG interpreted, Old chart reviewed, Radiograph reviewed and Notes from prior ED visits reviewed and incorporated        Presentation most consistent with complex axillary abscess. Given History, Exam, and Workup I have low suspicion for Cellulitis, Necrotizing Fasciitis, Pyomyositis, Sporotrichosis, Osteomyelitis or other emergent problem as a cause for this presentation. Interventions: [Patients abscess has been incised with acceptable resolution] Rx: Doxycycline 100 mg BID x 5 days Disposition: Patient is stable for discharge, advised to follow up with primary care physician in 48 hours.      ____________________________________________   FINAL CLINICAL IMPRESSION(S) / ED DIAGNOSES  Final diagnoses:  Abscess of axilla, left  Abscess of axilla, right  Cellulitis of left axilla  Cellulitis of abdominal wall     ED Discharge Orders          Ordered    doxycycline (VIBRAMYCIN) 50 MG capsule  2 times daily        03/13/21 1640             Note:  This document was prepared using Dragon voice recognition software and may include unintentional dictation errors.    Merwyn Katos, MD 03/13/21 [phone]

## 2021-03-28 ENCOUNTER — Emergency Department
Admission: EM | Admit: 2021-03-28 | Discharge: 2021-03-28 | Disposition: A | Discharge status: Home / Self Care | Payer: No Typology Code available for payment source | Attending: Emergency Medicine | Admitting: Emergency Medicine

## 2021-03-28 ENCOUNTER — Other Ambulatory Visit: Payer: Self-pay

## 2021-03-28 DIAGNOSIS — F1510 Other stimulant abuse, uncomplicated: Secondary | ICD-10-CM | POA: Diagnosis not present

## 2021-03-28 DIAGNOSIS — F1994 Other psychoactive substance use, unspecified with psychoactive substance-induced mood disorder: Secondary | ICD-10-CM

## 2021-03-28 DIAGNOSIS — F1914 Other psychoactive substance abuse with psychoactive substance-induced mood disorder: Secondary | ICD-10-CM | POA: Insufficient documentation

## 2021-03-28 DIAGNOSIS — F22 Delusional disorders: Secondary | ICD-10-CM | POA: Diagnosis present

## 2021-03-28 DIAGNOSIS — F17210 Nicotine dependence, cigarettes, uncomplicated: Secondary | ICD-10-CM | POA: Insufficient documentation

## 2021-03-28 NOTE — ED Provider Notes (Signed)
[facility] Emergency Department Provider Note  ____________________________________________   Event Date/Time   First MD Initiated Contact with Patient 03/28/21 0141     (approximate)  I have reviewed the triage vital signs and the nursing notes.   HISTORY  Chief Complaint Paranoid   Level 5 caveat: Patient is minimally forthcoming with information regarding his history and current symptoms.   HPI Susano Cleckler. is a 40 y.o. male with a history of psychiatric illness and drug abuse.  He presents voluntarily tonight stating that he is paranoid.  He said that he has been using meth and that he knows he is "a drug addict".  He said that he sometimes uses opiates and that he has recently used methamphetamine.  He said he has no desire to hurt himself or anyone else but thought he should get help.  He used drugs yesterday.  He denies chest pain or shortness of breath as well as abdominal pain but does not want to provide much additional detail.  He said his symptoms are severe and nothing in particular makes it better or worse.     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   Facial cellulitis 07/22/2020   Opiate abuse, episodic (HCC) 02/07/2017   Substance induced mood disorder (HCC) 02/07/2017   Opiate overdose (HCC) 11/12/2016   Staphylococcal sepsis (HCC) 05/06/2016   Opiate abuse, continuous (HCC) 05/22/2015   Cocaine abuse (HCC) 05/22/2015    Past Surgical History:  Procedure Laterality Date   NOSE SURGERY     TEE WITHOUT CARDIOVERSION N/A 05/10/2016   Procedure: TRANSESOPHAGEAL ECHOCARDIOGRAM  (TEE);  Surgeon: [name], MD;  Location: [facility] ORS;  Service: Cardiovascular;  Laterality: N/A;   TYMPANOPLASTY      Prior to Admission medications   Medication Sig Start Date End Date Taking? Authorizing Provider  Buprenorphine HCl-Naloxone HCl (SUBOXONE) 8-2 MG FILM Place 1 Film under the tongue 3 (three) times daily.     [provider]  naloxone [facility]) nasal spray 4 mg/0.1 mL Keep accessible in case of overdose. 01/24/19   Sharman Cheek, MD  amphetamine-dextroamphetamine (ADDERALL) 30 MG tablet Take 30 mg by mouth 2 (two) times daily.   02/28/21  [provider]  clonazePAM (KLONOPIN) 1 MG tablet Take 1 mg by mouth 2 (two) times daily.  02/28/21  [provider]  gabapentin (NEURONTIN) 400 MG capsule Take 400 mg by mouth 3 (three) times daily.  02/28/21  [provider]  lurasidone (LATUDA) 40 MG TABS tablet Take 40 mg by mouth daily with breakfast.  02/28/21  [provider]    Allergies Bactrim [sulfamethoxazole-trimethoprim] and Sulfa antibiotics  Family History  Problem Relation Age of Onset   Diabetes Mother    Hypertension Father    Kidney failure Father    Diabetes Father     Social History Social History   Tobacco Use   Smoking status: Every Day    Packs/day: 1.50    Types: Cigarettes   Smokeless tobacco: Current    Types: Snuff  Vaping  Use   Vaping Use: Never used  Substance Use Topics   Alcohol use: Yes   Drug use: Not Currently    Types: IV, Marijuana, Methamphetamines    Comment: heroine    Review of Systems Level 5 caveat: Patient is minimally forthcoming with information regarding his history and current symptoms.  ____________________________________________   PHYSICAL EXAM:  VITAL SIGNS: ED Triage Vitals [03/28/21 0056]  Enc Vitals Group     BP (!) 146/103     Pulse Rate (!) 103     Resp 18     Temp 98 F (36.7 C)     Temp Source Oral     SpO2 100 %     Weight 102.5 kg (226 lb)      Height 1.753 m (5\' 9" )     Head Circumference      Peak Flow      Pain Score 0     Pain Loc      Pain Edu?      Excl. in GC?     Constitutional: Alert and oriented.  Eyes: Conjunctivae are normal.  Head: Atraumatic. Nose: No congestion/rhinnorhea. Mouth/Throat: Patient is wearing a mask. Neck: No stridor.  No meningeal signs.   Cardiovascular: Normal rate, regular rhythm. Good peripheral circulation. Respiratory: Normal respiratory effort.  No retractions. Gastrointestinal: Soft and nontender. No distention.  Musculoskeletal: No lower extremity tenderness nor edema. No gross deformities of extremities. Neurologic:  Normal speech and language. No gross focal neurologic deficits are appreciated.  Skin:  Skin is warm, dry and intact. Psychiatric: Mood and affect are normal. Speech and behavior are normal.  He is calm and cooperative, not responding to internal stimuli, showing good insight that his drug abuse is a problem for him, and denies SI and HI.  ____________________________________________   LABS (all labs ordered are listed, but only abnormal results are displayed)  Labs Reviewed - No data to display  ____________________________________________   INITIAL IMPRESSION / MDM / ASSESSMENT AND PLAN / ED COURSE  As part of my medical decision making, I reviewed the following data within the electronic MEDICAL RECORD NUMBER Nursing notes reviewed and incorporated, Old chart reviewed, and Notes from prior ED visits   Differential diagnosis includes, but is not limited to, substance-induced mood disorder, chronic psychiatric illness, malingering, secondary gain.  Patient refused lab work initially and slept for most of the night.  Vital signs are stable and within normal limits.  Patient has had multiple recent and prior emergency department visits and has been cleared by psychiatry each time.  He is currently denying SI and HI.  It is unclear what exactly brought him in but he is now  clinically sober, ambulating without difficulty, and continuing to deny SI and HI.  I explained that he was best suited for outpatient follow-up and talking with [facility] or [facility].  He also has a psychiatrist that he can follow-up with.  He initially agreed with this.  I was told that at the time of discharge, he told the nurse that he did not have a ride.  He was told that he could wait out in the lobby for the bus to start running.  At that point he became a little bit agitated and making vague comments about maybe he does want to kill himself, but I believe this is due to malingering and unhappiness over not having a ride and not wanting to leave early in the morning.  Again, he has recently been evaluated by  psychiatry and was found to not meet inpatient criteria and I believe the same to be true today.  He was discharged with multiple outpatient follow-up recommendations and once he was told that he would be discharged to calm down and did not continue to make concerning or threatening comments.           ____________________________________________  FINAL CLINICAL IMPRESSION(S) / ED DIAGNOSES  Final diagnoses:  Paranoia (HCC)  Methamphetamine abuse (HCC)  Substance induced mood disorder (HCC)     MEDICATIONS GIVEN DURING THIS VISIT:  Medications - No data to display   ED Discharge Orders     None        Note:  This document was prepared using Dragon voice recognition software and may include unintentional dictation errors.   Loleta Rose, MD 03/28/21 [phone]

## 2021-03-28 NOTE — ED Notes (Signed)
Belongings...  Black shorts Black Shirt Blue Boxers (underwear) Shoes 21 packets of Suboxone

## 2021-03-28 NOTE — ED Notes (Signed)
Pt refusing to allow lab to draw blood work at this time.

## 2021-03-28 NOTE — ED Notes (Signed)
EDP made aware that patient refusing labs at this time, EDP acknowledged. No new orders.

## 2021-03-28 NOTE — ED Notes (Addendum)
This RN attempted x 1 for blood without success, Beth, EDT attempted x 2 for blood work without success. Lab called to draw blood. Pt repeatedly palpating site and stating, "there's a needle in my arm, there's a needle in my arm".  This RN explained to patient multiple times that there was no needle in his arm.

## 2021-03-28 NOTE — ED Triage Notes (Signed)
Pt states he is paranoid. Pt states this began several months ago after a meth binge and "then people started fucking with me". Pt denies SI and HI.

## 2021-03-28 NOTE — ED Notes (Signed)
Discharge instructions reviewed with pt.  Informed patient bus runs at 930 AM and it is free.  Will give patient sandwich while waiting for bus.  NAD. Unlabored. Wants off drugs.  Reports has been depressed.  EDP aware and ok for discharge.

## 2021-10-19 ENCOUNTER — Emergency Department
Admission: EM | Admit: 2021-10-19 | Discharge: 2021-10-19 | Disposition: A | Discharge status: Home / Self Care | Payer: Medicaid Other | Attending: Emergency Medicine | Admitting: Emergency Medicine

## 2021-10-19 ENCOUNTER — Other Ambulatory Visit: Payer: Self-pay

## 2021-10-19 DIAGNOSIS — A4902 Methicillin resistant Staphylococcus aureus infection, unspecified site: Secondary | ICD-10-CM

## 2021-10-19 DIAGNOSIS — K0889 Other specified disorders of teeth and supporting structures: Secondary | ICD-10-CM | POA: Diagnosis not present

## 2021-10-19 DIAGNOSIS — L539 Erythematous condition, unspecified: Secondary | ICD-10-CM | POA: Diagnosis present

## 2021-10-19 MED ORDER — CLINDAMYCIN HCL 300 MG PO CAPS: 300.0000 mg | ORAL_CAPSULE | Freq: Three times a day (TID) | ORAL | 0 refills | Status: AC

## 2021-10-19 MED ORDER — CLINDAMYCIN HCL 150 MG PO CAPS
300.0000 mg | ORAL_CAPSULE | Freq: Once | ORAL | Status: AC
  Administered 2021-10-19: 300 mg via ORAL
  Filled 2021-10-19: qty 2

## 2021-10-19 MED ORDER — CLINDAMYCIN PHOSPHATE 1 % EX GEL: CUTANEOUS | 0 refills | Status: AC

## 2021-10-19 NOTE — ED Triage Notes (Signed)
Pt comes with c/o dental pain and possible abscess.

## 2021-10-19 NOTE — ED Provider Notes (Signed)
[facility] Provider Note  Patient Contact: 5:32 PM (approximate)   History   Dental Pain   HPI  Randy Woods. is a 41 y.o. male who presents emergency department for 2 lesions.  Patient has a painful edematous erythematous lesion to the posterior neck and a painful erythematous edematous lesion to the left nares.  Patient reports that he does have a history of MRSA infections and believes that he has another same.  There is no drainage from either site.  The neck has been ongoing times a week in the nose for the last 24 to 48 hours.  No fevers or chills.  No other systemic complaints.  Triage note of the patient was complaining of dental pain, however the patient denies any complaint of dental pain, states that he is here for these 2 lesions.     Physical Exam   Triage Vital Signs: ED Triage Vitals  Enc Vitals Group     BP 10/19/21 1716 (!) 155/99     Pulse Rate 10/19/21 1716 99     Resp 10/19/21 1716 18     Temp 10/19/21 1716 99 F (37.2 C)     Temp Source 10/19/21 1716 Oral     SpO2 10/19/21 1716 96 %     Weight 10/19/21 1717 220 lb (99.8 kg)     Height 10/19/21 1717 5\' 9"  (1.753 m)     Head Circumference --      Peak Flow --      Pain Score 10/19/21 1704 8     Pain Loc --      Pain Edu? --      Excl. in GC? --     Most recent vital signs: Vitals:   10/19/21 1716  BP: (!) 155/99  Pulse: 99  Resp: 18  Temp: 99 F (37.2 C)  SpO2: 96%     General: Alert and in no acute distress. ENT:      Ears:       Nose: No congestion/rhinnorhea.  Visualization of the left nares reveals an erythematous and edematous lesion in the left lateral nares.  Patient is tender over the external surface but there is no purulence expressed with palpation of the nares.      Mouth/Throat: Mucous membranes are moist. Hematological/Lymphatic/Immunilogical: No cervical lymphadenopathy. Cardiovascular:  Good peripheral perfusion Respiratory: Normal respiratory  effort without tachypnea or retractions. Lungs CTAB.  Musculoskeletal: Full range of motion to all extremities.  Neurologic:  No gross focal neurologic deficits are appreciated.  Skin:   No rash noted.  Visualization of the posterior neck reveals an edematous lesion with necrotic area in the central portion of this lesion.  It is erythematous and the surrounding borders.  Measures approximately 3 cm in diameter.  Area is very firm to palpation but no fluctuance.  No purulent drainage with palpation. Other:   ED Results / Procedures / Treatments   Labs (all labs ordered are listed, but only abnormal results are displayed) Labs Reviewed - No data to display   EKG     RADIOLOGY    No results found.  PROCEDURES:  Critical Care performed: No  Procedures   MEDICATIONS ORDERED IN ED: Medications - No data to display   IMPRESSION / MDM / ASSESSMENT AND PLAN / ED COURSE  I reviewed the triage vital signs and the nursing notes.  Differential diagnosis includes, but is not limited to, cellulitis, abscess, MRSA   Patient's diagnosis is consistent with MRSA infection.  Patient presents to the ED with 2 lesions 1 to the left nares and 1 to the posterior neck.  Findings were consistent with a MRSA infection.  Patient states that he has a very severe rash in which the skin of his penis and scrotum sloughs off with the use of sulfa antibiotics.  It sounds like the patient does have a Stevens-Johnson's type reaction to sulfa.  As such this does limit the treatment for the intra nares lesion.  At this time I will treat with oral and topical clindamycin..  First dose provided to the here in the ED.  Patient is given ED precautions to return to the ED for any worsening or new symptoms.        FINAL CLINICAL IMPRESSION(S) / ED DIAGNOSES   Final diagnoses:  MRSA (methicillin resistant Staphylococcus aureus)     Rx / DC Orders   ED Discharge Orders           Ordered    clindamycin (CLEOCIN) 300 MG capsule  3 times daily        10/19/21 1757    clindamycin (CLINDAGEL) 1 % gel        10/19/21 1757             Note:  This document was prepared using Dragon voice recognition software and may include unintentional dictation errors.   Lanette Hampshire 10/19/21 1757    Sharman Cheek, MD 10/20/21 [phone]

## 2021-10-19 NOTE — ED Notes (Signed)
See triage note  presents with possible dental abscess

## 2021-12-20 ENCOUNTER — Other Ambulatory Visit: Payer: Self-pay

## 2021-12-20 ENCOUNTER — Emergency Department
Admission: EM | Admit: 2021-12-20 | Discharge: 2021-12-21 | Disposition: A | Discharge status: Home / Self Care | Payer: No Typology Code available for payment source | Attending: Emergency Medicine | Admitting: Emergency Medicine

## 2021-12-20 DIAGNOSIS — F1114 Opioid abuse with opioid-induced mood disorder: Secondary | ICD-10-CM | POA: Diagnosis present

## 2021-12-20 DIAGNOSIS — F1910 Other psychoactive substance abuse, uncomplicated: Secondary | ICD-10-CM

## 2021-12-20 DIAGNOSIS — R45851 Suicidal ideations: Secondary | ICD-10-CM | POA: Insufficient documentation

## 2021-12-20 DIAGNOSIS — F1994 Other psychoactive substance use, unspecified with psychoactive substance-induced mood disorder: Secondary | ICD-10-CM

## 2021-12-20 DIAGNOSIS — Z20822 Contact with and (suspected) exposure to covid-19: Secondary | ICD-10-CM | POA: Diagnosis not present

## 2021-12-20 DIAGNOSIS — F1990 Other psychoactive substance use, unspecified, uncomplicated: Secondary | ICD-10-CM

## 2021-12-20 DIAGNOSIS — F17210 Nicotine dependence, cigarettes, uncomplicated: Secondary | ICD-10-CM | POA: Insufficient documentation

## 2021-12-20 LAB — COMPREHENSIVE METABOLIC PANEL
ALT: 30 U/L (ref 0–44)
AST: 29 U/L (ref 15–41)
Albumin: 3.9 g/dL (ref 3.5–5.0)
Alkaline Phosphatase: 51 U/L (ref 38–126)
Anion gap: 7 (ref 5–15)
BUN: 19 mg/dL (ref 6–20)
CO2: 28 mmol/L (ref 22–32)
Calcium: 8.6 mg/dL — ABNORMAL LOW (ref 8.9–10.3)
Chloride: 102 mmol/L (ref 98–111)
Creatinine, Ser: 0.8 mg/dL (ref 0.61–1.24)
GFR, Estimated: >60 mL/min (ref >60)
Glucose, Bld: 119 mg/dL — ABNORMAL HIGH (ref 70–99)
Potassium: 3.9 mmol/L (ref 3.5–5.1)
Sodium: 137 mmol/L (ref 135–145)
Total Bilirubin: 0.8 mg/dL (ref 0.3–1.2)
Total Protein: 7.8 g/dL (ref 6.5–8.1)

## 2021-12-20 LAB — URINE DRUG SCREEN, QUALITATIVE (ARMC ONLY)
Amphetamines, Ur Screen: POSITIVE — AB
Barbiturates, Ur Screen: NOT DETECTED
Benzodiazepine, Ur Scrn: NOT DETECTED
Cannabinoid 50 Ng, Ur ~~LOC~~: POSITIVE — AB
Cocaine Metabolite,Ur ~~LOC~~: NOT DETECTED
MDMA (Ecstasy)Ur Screen: NOT DETECTED
Methadone Scn, Ur: POSITIVE — AB
Opiate, Ur Screen: POSITIVE — AB
Phencyclidine (PCP) Ur S: NOT DETECTED
Tricyclic, Ur Screen: NOT DETECTED

## 2021-12-20 LAB — CBC
HCT: 40.7 % (ref 39.0–52.0)
Hemoglobin: 13.2 g/dL (ref 13.0–17.0)
MCH: 28 pg (ref 26.0–34.0)
MCHC: 32.4 g/dL (ref 30.0–36.0)
MCV: 86.2 fL (ref 80.0–100.0)
Platelets: 169 10*3/uL (ref 150–400)
RBC: 4.72 MIL/uL (ref 4.22–5.81)
RDW: 12.6 % (ref 11.5–15.5)
WBC: 6.9 10*3/uL (ref 4.0–10.5)
nRBC: 0 % (ref 0.0–0.2)

## 2021-12-20 LAB — RESP PANEL BY RT-PCR (FLU A&B, COVID) ARPGX2
Influenza A by PCR: NEGATIVE
Influenza B by PCR: NEGATIVE
SARS Coronavirus 2 by RT PCR: NEGATIVE

## 2021-12-20 LAB — ACETAMINOPHEN LEVEL: Acetaminophen (Tylenol), Serum: <10 ug/mL — ABNORMAL LOW (ref 10–30)

## 2021-12-20 LAB — ETHANOL: Alcohol, Ethyl (B): <10 mg/dL (ref <10)

## 2021-12-20 LAB — SALICYLATE LEVEL: Salicylate Lvl: <7 mg/dL — ABNORMAL LOW (ref 7.0–30.0)

## 2021-12-20 MED ORDER — CLONIDINE HCL 0.1 MG PO TABS
0.1000 mg | ORAL_TABLET | ORAL | Status: DC
Start: 2021-12-23 — End: 2021-12-21

## 2021-12-20 MED ORDER — GABAPENTIN 300 MG PO CAPS
300.0000 mg | ORAL_CAPSULE | Freq: Three times a day (TID) | ORAL | Status: DC
  Administered 2021-12-20 – 2021-12-21 (×3): 300 mg via ORAL
  Filled 2021-12-20 (×3): qty 1

## 2021-12-20 MED ORDER — HYDROXYZINE HCL 25 MG PO TABS: 25.0000 mg | ORAL_TABLET | Freq: Four times a day (QID) | ORAL | Status: DC | PRN

## 2021-12-20 MED ORDER — ONDANSETRON 4 MG PO TBDP: 4.0000 mg | ORAL_TABLET | Freq: Four times a day (QID) | ORAL | Status: DC | PRN

## 2021-12-20 MED ORDER — LOPERAMIDE HCL 2 MG PO CAPS: 2.0000 mg | ORAL_CAPSULE | ORAL | Status: DC | PRN

## 2021-12-20 MED ORDER — DICYCLOMINE HCL 20 MG PO TABS
20.0000 mg | ORAL_TABLET | Freq: Four times a day (QID) | ORAL | Status: DC | PRN
  Filled 2021-12-20: qty 1

## 2021-12-20 MED ORDER — NAPROXEN 500 MG PO TABS
500.0000 mg | ORAL_TABLET | Freq: Two times a day (BID) | ORAL | Status: DC | PRN
Start: 2021-12-20 — End: 2021-12-21
  Filled 2021-12-20: qty 1

## 2021-12-20 MED ORDER — CLONIDINE HCL 0.1 MG PO TABS: 0.1000 mg | ORAL_TABLET | Freq: Every day | ORAL | Status: DC

## 2021-12-20 MED ORDER — CLONIDINE HCL 0.1 MG PO TABS
0.1000 mg | ORAL_TABLET | Freq: Four times a day (QID) | ORAL | Status: DC
  Administered 2021-12-20 – 2021-12-21 (×2): 0.1 mg via ORAL
  Filled 2021-12-20 (×2): qty 1

## 2021-12-20 MED ORDER — METHOCARBAMOL 500 MG PO TABS
500.0000 mg | ORAL_TABLET | Freq: Three times a day (TID) | ORAL | Status: DC | PRN
  Filled 2021-12-20: qty 1

## 2021-12-20 NOTE — Consult Note (Signed)
Client would not awaken for his assessment, will try later. ? ?Waylan Boga, PMHNP ?

## 2021-12-20 NOTE — ED Notes (Signed)
IVC/pending psych consult 

## 2021-12-20 NOTE — ED Triage Notes (Signed)
Pt states he is suicidal and reports heroin use prior to arrival. Pt is ambulatory and A&O times 4. Denies AVH.  ?

## 2021-12-20 NOTE — ED Notes (Signed)
Pt received breakfast tray and remains sleeping at this time.  ?

## 2021-12-20 NOTE — ED Provider Notes (Signed)
? ?[facility] ?Provider Note ? ? ? Event Date/Time  ? First MD Initiated Contact with Patient 12/20/21 0207   ?  (approximate) ? ? ?History  ? ?Chief Complaint ?Suicidal ? ? ?HPI ? ?Randy Woods. is a 41 y.o. male with past medical history of IV drug use, opiate abuse, and substance-induced mood disorder who presents to the ED complaining of suicidal ideation.  Patient reports that for the past couple of days he has been having thoughts of ending his own life.  He states that he specifically thinks about intentionally overdosing, denies having done so previously.  He reports his last use of heroin was earlier this evening, also admits using methamphetamines but denies injecting them.  He denies alcohol abuse and denies of any medical complaints at this time.  He was recently seen in the ED for skin lesions concerning for MRSA, states these resolved after course of antibiotics. ?  ? ? ?Physical Exam  ? ?Triage Vital Signs: ?ED Triage Vitals  ?Enc Vitals Group  ?   BP 12/20/21 0142 (!) 156/107  ?   Pulse Rate 12/20/21 0142 79  ?   Resp 12/20/21 0142 17  ?   Temp 12/20/21 0142 97.6 ?F (36.4 ?C)  ?   Temp Source 12/20/21 0142 Oral  ?   SpO2 12/20/21 0142 100 %  ?   Weight 12/20/21 0142 225 lb (102.1 kg)  ?   Height 12/20/21 0142 5\' 9"  (1.753 m)  ?   Head Circumference --   ?   Peak Flow --   ?   Pain Score 12/20/21 0142 0  ?   Pain Loc --   ?   Pain Edu? --   ?   Excl. in GC? --   ? ? ?Most recent vital signs: ?Vitals:  ? 12/20/21 0142  ?BP: (!) 156/107  ?Pulse: 79  ?Resp: 17  ?Temp: 97.6 ?F (36.4 ?C)  ?SpO2: 100%  ? ? ?Constitutional: Alert and oriented. ?Eyes: Conjunctivae are normal. ?Head: Atraumatic. ?Nose: No congestion/rhinnorhea. ?Mouth/Throat: Mucous membranes are moist.  ?Cardiovascular: Normal rate, regular rhythm. Grossly normal heart sounds.  2+ radial pulses bilaterally. ?Respiratory: Normal respiratory effort.  No retractions. Lungs CTAB. ?Gastrointestinal: Soft and nontender.  No distention. ?Musculoskeletal: No lower extremity tenderness nor edema.  Track marks to bilateral forearms and shins, no associated erythema, edema, warmth, or purulence. ?Neurologic:  Normal speech and language. No gross focal neurologic deficits are appreciated. ? ? ? ?ED Results / Procedures / Treatments  ? ?Labs ?(all labs ordered are listed, but only abnormal results are displayed) ?Labs Reviewed  ?COMPREHENSIVE METABOLIC PANEL - Abnormal; Notable for the following components:  ?    Result Value  ? Glucose, Bld 119 (*)   ? Calcium 8.6 (*)   ? All other components within normal limits  ?SALICYLATE LEVEL - Abnormal; Notable for the following components:  ? Salicylate Lvl <7.0 (*)   ? All other components within normal limits  ?ACETAMINOPHEN LEVEL - Abnormal; Notable for the following components:  ? Acetaminophen (Tylenol), Serum <10 (*)   ? All other components within normal limits  ?URINE DRUG SCREEN, QUALITATIVE ([facility] ONLY) - Abnormal; Notable for the following components:  ? Amphetamines, Ur Screen POSITIVE (*)   ? Opiate, Ur Screen POSITIVE (*)   ? Cannabinoid 50 Ng, Ur [place] POSITIVE (*)   ? Methadone Scn, Ur POSITIVE (*)   ? All other components within normal limits  ?ETHANOL  ?CBC  ? ? ? ?  PROCEDURES: ? ?Critical Care performed: No ? ?Procedures ? ? ?MEDICATIONS ORDERED IN ED: ?Medications - No data to display ? ? ?IMPRESSION / MDM / ASSESSMENT AND PLAN / ED COURSE  ?I reviewed the triage vital signs and the nursing notes. ?             ?               ? ?41 y.o. male with past medical history of IV drug use, opiate abuse, and substance-induced mood disorder who presents to the ED complaining of suicidal ideation and thoughts of intentionally overdosing. ? ?Differential diagnosis includes, but is not limited to, suicidal ideation, cellulitis, bacteremia, sepsis induced mood disorder, malingering. ? ?Patient nontoxic-appearing and in no acute distress, vital signs are unremarkable and he does not have any  areas concerning for cellulitis or abscess.  He may be medically cleared for psychiatric disposition, patient placed under IVC due to SI with plan and high risk for leaving prior to psychiatric evaluation.  Screening labs are unremarkable, CBC without leukocytosis or anemia, BMP without electrolyte abnormality, Tylenol and salicylate levels are undetectable. ? ?The patient has been placed in psychiatric observation due to the need to provide a safe environment for the patient while obtaining psychiatric consultation and evaluation, as well as ongoing medical and medication management to treat the patient's condition.  The patient has been placed under full IVC at this time. ? ?  ? ? ?FINAL CLINICAL IMPRESSION(S) / ED DIAGNOSES  ? ?Final diagnoses:  ?Suicidal ideation  ?IVDU (intravenous drug user)  ? ? ? ?Rx / DC Orders  ? ?ED Discharge Orders   ? ? None  ? ?  ? ? ? ?Note:  This document was prepared using Dragon voice recognition software and may include unintentional dictation errors. ?  ?Chesley Noon, MD ?12/20/21 0241 ? ?

## 2021-12-20 NOTE — ED Notes (Signed)
Pt given two warm blankets and a cup of ice water. Pt has no further needs at this time. Will continue to monitor.  ?

## 2021-12-20 NOTE — ED Notes (Signed)
Pt was dressed out into blue scrubs and gray socks. Pts belongings were placed into patient belongings bag. Belongings consist of: ? ?Black socks ?Gray shoes ?Gray pants  ?Blue short sleeve shirt ?2 Black long sleeve shirt  ?Wallace Cullens shorts ?Gray underwear ? ?

## 2021-12-20 NOTE — ED Notes (Signed)
Patient provided snack at appropriate snack time.  Pt consumed 100% of snack provided, tolerated well w/o complaints   Trash disposted of appropriately by patient.  

## 2021-12-20 NOTE — Consult Note (Addendum)
[facility] Face-to-Face Psychiatry Consult  ? ?Reason for Consult:  heroin and meth use with depression ?Referring Physician:  EDP ?Patient Identification: Randy Woods. ?MRN:  [id number] ?Principal Diagnosis:  Substance induced mood disorder ?Diagnosis:  Active Problems: ?  Substance induced mood disorder (HCC) ? ? ?Total Time spent with patient: 45 minutes ? ?Subjective:   ?Randy Dahlem. is a 41 y.o. male patient admitted after unintentional heroin overdose.  "I'm not doing too good." ? ?HPI:  41 yo male presents after an unintentional overdose on heroin.  Assessment attempted earlier but he wouldn't arouse.  This afternoon, he is more coherent yet continues to have difficulty answering the questions, appears under the influence still.  He has been depressed lately, moderate to high, related to not having a job and living with his mother.  Denies any issues with their relationship, mumbles something about children.  He is interested in going to rehab at [facility], outpatient in [place] in place already with [facility].  Reassess in the am for disposition when he is more clear and easier to comprehend. ? ?Past Psychiatric History: depression, substance abuse ? ?Risk to Self:  none ?Risk to Others:  none ?Prior Inpatient Therapy:  yes ?Prior Outpatient Therapy:   ? ?Past Medical History:  ?Past Medical History:  ?Diagnosis Date  ? Anxiety   ? Bipolar 1 disorder (HCC)   ? Bronchitis   ? Cocaine abuse (HCC)   ? Depression   ? Drug abuse (HCC)   ? Heroin abuse (HCC)   ? Schizophrenia (HCC)   ?  ?Past Surgical History:  ?Procedure Laterality Date  ? NOSE SURGERY    ? TEE WITHOUT CARDIOVERSION N/A 05/10/2016  ? Procedure: TRANSESOPHAGEAL ECHOCARDIOGRAM (TEE);  Surgeon: [name], MD;  Location: [facility] ORS;  Service: Cardiovascular;  Laterality: N/A;  ? TYMPANOPLASTY    ? ?Family History:  ?Family History  ?Problem Relation Age of Onset  ? Diabetes Mother   ? Hypertension Father   ? Kidney  failure Father   ? Diabetes Father   ? ?Family Psychiatric  History: none ?Social History:  ?Social History  ? ?Substance and Sexual Activity  ?Alcohol Use Yes  ?   ?Social History  ? ?Substance and Sexual Activity  ?Drug Use Not Currently  ? Types: IV, Marijuana, Methamphetamines  ? Comment: heroine  ?  ?Social History  ? ?Socioeconomic History  ? Marital status: Married  ?  Spouse name: Not on file  ? Number of children: Not on file  ? Years of education: Not on file  ? Highest education level: Not on file  ?Occupational History  ? Not on file  ?Tobacco Use  ? Smoking status: Every Day  ?  Packs/day: 1.50  ?  Types: Cigarettes  ? Smokeless tobacco: Current  ?  Types: Snuff  ?Vaping Use  ? Vaping Use: Never used  ?Substance and Sexual Activity  ? Alcohol use: Yes  ? Drug use: Not Currently  ?  Types: IV, Marijuana, Methamphetamines  ?  Comment: heroine  ? Sexual activity: Not on file  ?Other Topics Concern  ? Not on file  ?Social History Narrative  ? Not on file  ? ?Social Determinants of Health  ? ?Financial Resource Strain: Not on file  ?Food Insecurity: Not on file  ?Transportation Needs: Not on file  ?Physical Activity: Not on file  ?Stress: Not on file  ?Social Connections: Not on file  ? ?Additional Social History: ?  ? ?  Allergies:   ?Allergies  ?Allergen Reactions  ? Bactrim [Sulfamethoxazole-Trimethoprim]   ? Sulfa Antibiotics Other (See Comments) and Rash  ?  Pt states "the skin peels off of my penis" ?Rash to penis  ? ? ?Labs:  ?Results for orders placed or performed during the hospital encounter of 12/20/21 (from the past 48 hour(s))  ?Comprehensive metabolic panel     Status: Abnormal  ? Collection Time: 12/20/21  1:45 AM  ?Result Value Ref Range  ? Sodium 137 135 - 145 mmol/L  ? Potassium 3.9 3.5 - 5.1 mmol/L  ? Chloride 102 98 - 111 mmol/L  ? CO2 28 22 - 32 mmol/L  ? Glucose, Bld 119 (H) 70 - 99 mg/dL  ?  Comment: Glucose reference range applies only to samples taken after fasting for at least 8  hours.  ? BUN 19 6 - 20 mg/dL  ? Creatinine, Ser 0.80 0.61 - 1.24 mg/dL  ? Calcium 8.6 (L) 8.9 - 10.3 mg/dL  ? Total Protein 7.8 6.5 - 8.1 g/dL  ? Albumin 3.9 3.5 - 5.0 g/dL  ? AST 29 15 - 41 U/L  ? ALT 30 0 - 44 U/L  ? Alkaline Phosphatase 51 38 - 126 U/L  ? Total Bilirubin 0.8 0.3 - 1.2 mg/dL  ? GFR, Estimated >60 >60 mL/min  ?  Comment: (NOTE) ?Calculated using the CKD-EPI Creatinine Equation (2021) ?  ? Anion gap 7 5 - 15  ?  Comment: Performed at [facility], [address] [place], [place] [address]  ?Ethanol     Status: None  ? Collection Time: 12/20/21  1:45 AM  ?Result Value Ref Range  ? Alcohol, Ethyl (B) <10 <10 mg/dL  ?  Comment: (NOTE) ?Lowest detectable limit for serum alcohol is 10 mg/dL. ? ?For medical purposes only. ?Performed at [facility], [address] [place], ?[place] [address] ?  ?Salicylate level     Status: Abnormal  ? Collection Time: 12/20/21  1:45 AM  ?Result Value Ref Range  ? Salicylate Lvl <7.0 (L) 7.0 - 30.0 mg/dL  ?  Comment: Performed at [facility], [address], [place], [place] [address]  ?Acetaminophen level     Status: Abnormal  ? Collection Time: 12/20/21  1:45 AM  ?Result Value Ref Range  ? Acetaminophen (Tylenol), Serum <10 (L) 10 - 30 ug/mL  ?  Comment: (NOTE) ?Therapeutic concentrations vary significantly. A range of 10-30 ug/mL  ?may be an effective concentration for many patients. However, some  ?are best treated at concentrations outside of this range. ?Acetaminophen concentrations >150 ug/mL at 4 hours after ingestion  ?and >50 ug/mL at 12 hours after ingestion are often associated with  ?toxic reactions. ? ?Performed at [facility], [address] [place], ?[place] [address] ?  ?cbc     Status: None  ? Collection Time: 12/20/21  1:45 AM  ?Result Value Ref Range  ? WBC 6.9 4.0 - 10.5 K/uL  ? RBC 4.72 4.22 - 5.81 MIL/uL  ? Hemoglobin 13.2 13.0 - 17.0 g/dL  ? HCT 40.7 39.0 - 52.0 %  ? MCV 86.2 80.0 - 100.0 fL  ? MCH 28.0  26.0 - 34.0 pg  ? MCHC 32.4 30.0 - 36.0 g/dL  ? RDW 12.6 11.5 - 15.5 %  ? Platelets 169 150 - 400 K/uL  ? nRBC 0.0 0.0 - 0.2 %  ?  Comment: Performed at [facility], [address], [place], [place] [address]  ?Urine  Drug Screen, Qualitative     Status: Abnormal  ? Collection Time: 12/20/21  1:58 AM  ?Result Value Ref Range  ? Tricyclic, Ur Screen NONE DETECTED NONE DETECTED  ? Amphetamines, Ur Screen POSITIVE (A) NONE DETECTED  ? MDMA (Ecstasy)Ur Screen NONE DETECTED NONE DETECTED  ? Cocaine Metabolite,Ur [place] NONE DETECTED NONE DETECTED  ? Opiate, Ur Screen POSITIVE (A) NONE DETECTED  ? Phencyclidine (PCP) Ur S NONE DETECTED NONE DETECTED  ? Cannabinoid 50 Ng, Ur [place] POSITIVE (A) NONE DETECTED  ? Barbiturates, Ur Screen NONE DETECTED NONE DETECTED  ? Benzodiazepine, Ur Scrn NONE DETECTED NONE DETECTED  ? Methadone Scn, Ur POSITIVE (A) NONE DETECTED  ?  Comment: (NOTE) ?Tricyclics + metabolites, urine    Cutoff 1000 ng/mL ?Amphetamines + metabolites, urine  Cutoff 1000 ng/mL ?MDMA (Ecstasy), urine              Cutoff 500 ng/mL ?Cocaine Metabolite, urine          Cutoff 300 ng/mL ?Opiate + metabolites, urine        Cutoff 300 ng/mL ?Phencyclidine (PCP), urine         Cutoff 25 ng/mL ?Cannabinoid, urine                 Cutoff 50 ng/mL ?Barbiturates + metabolites, urine  Cutoff 200 ng/mL ?Benzodiazepine, urine              Cutoff 200 ng/mL ?Methadone, urine                   Cutoff 300 ng/mL ? ?The urine drug screen provides only a preliminary, unconfirmed ?analytical test result and should not be used for non-medical ?purposes. Clinical consideration and professional judgment should ?be applied to any positive drug screen result due to possible ?interfering substances. A more specific alternate chemical method ?must be used in order to obtain a confirmed analytical result. ?Gas chromatography / mass spectrometry (GC/MS) is the preferred ?confirm atory method. ?Performed at [facility], [address] [place], ?[place] [id number] ?  ?Resp Panel by RT-PCR (Flu A&B, Covid) Nasopharyngeal Swab     Status: None  ? Collection Time: 12/20/21  3:01 AM  ? Specimen: Nasopharyngeal Swab; Nasopharyngeal(NP) swabs in

## 2021-12-20 NOTE — ED Notes (Signed)
Report to include Situation, Background, Assessment, and Recommendations received from Katie RN. Patient alert and oriented, warm and dry, in no acute distress. Patient denies SI, HI, AVH and pain. Patient made aware of Q15 minute rounds and security cameras for their safety. Patient instructed to come to me with needs or concerns.  

## 2021-12-20 NOTE — ED Notes (Signed)
Hospital meal provided, pt tolerated w/o complaints.  Waste discarded appropriately.  

## 2021-12-20 NOTE — ED Notes (Signed)
NAD noted at this time. Pt resting in bed in direct line of sight of staff. Respirations even and unlabored.  ?

## 2021-12-20 NOTE — BH Assessment (Addendum)
Comprehensive Clinical Assessment (CCA) Note ? ?12/20/2021 ?Kapil Marcos EkeL Mihalko Jr. ?[id number] ?Recommendations for Services/Supports/Treatments: Pt pending psych consult/disposition. ? ?Randy Woods is a 41 year old, English speaking, Caucasian male with a hx of cocaine abuse, opiate abuse, opiate overdose, amphetamine induced psychosis, and substance induced mood disorder. Per triage note: Pt presents to ER voluntarily with complaints of suicidal ideation. Pt has since been IVC'd by EDP Dr. Larinda ButteryJessup. On assessment, pt. continuously nodded in and out due to being under the influence. Pt was barely coherent due to having lost his voice. Pt was impaired with slurred speech. Pt had a disheveled appearance. Pt admitted to using unknown amounts of marijuana, heroin, and meth prior to arrival. Pt reported using heroin, daily, meth every other day, and sporadic marijuana use. Pt had difficulty concentrating, passively responding throughout the assessment. Pt's mood/affect appropriate to the situation. Pt admitted to thoughts of SI x 1 week. Pt reported having a plan but was unable to articulate said plan. Pt denied current HI/AV/H. Pt made no eye contact. Pt's UDS + for amphetamines, opiates, cannabis, and methadone.  ? ?Chief Complaint:  ?Chief Complaint  ?Patient presents with  ? Suicidal  ? ?Visit Diagnosis: Polysubtance abuse  ? ? ?CCA Screening, Triage and Referral (STR) ? ?Patient Reported Information ?How did you hear about us? Self ? ?Referral name: No data recorded ?Referral phone number: No data recorded ? ?Whom do you see for routine medical problems? No data recorded ?Practice/Facility Name: No data recorded ?Practice/Facility Phone Number: No data recorded ?Name of Contact: No data recorded ?Contact Number: No data recorded ?Contact Fax Number: No data recorded ?Prescriber Name: No data recorded ?Prescriber Address (if known): No data recorded ? ?What Is the Reason for Your Visit/Call Today? Pt states he is  suicidal and reports heroin use prior to arrival. ? ?How Long Has This Been Causing You Problems? 1 wk - 1 month ? ?What Do You Feel Would Help You the Most Today? Alcohol or Drug Use Treatment ? ? ?Have You Recently Been in Any Inpatient Treatment (Hospital/Detox/Crisis Center/28-Day Program)? No ? ?Name/Location of Program/Hospital:No data recorded ?How Long Were You There? No data recorded ?When Were You Discharged? No data recorded ? ?Have You Ever Received Services From [facility] Before? Yes ? ?Who [facility] You [facility] at [facility]? [facility] ? ? ?Have You Recently Had Any Thoughts About Hurting Yourself? Yes ? ?Are You Planning to Commit Suicide/Harm Yourself At This time? Yes ? ? ?Have you Recently Had Thoughts About Hurting Someone Karolee Ohslse? No ? ?Explanation: No data recorded ? ?Have You Used Any Alcohol or Drugs in the Past 24 Hours? Yes ? ?How Long Ago Did You Use Drugs or Alcohol? No data recorded ?What Did You Use and How Much? Unknown amount amphetamines, cocaine, marijuana ? ? ?Do You Currently Have a Therapist/Psychiatrist? No ? ?Name of Therapist/Psychiatrist: No data recorded ? ?Have You Been Recently Discharged From Any Office Practice or Programs? No ? ?Explanation of Discharge From Practice/Program: No data recorded ? ?  ?CCA Screening Triage Referral Assessment ?Type of Contact: Face-to-Face ? ?Is this Initial or Reassessment? No data recorded ?Date Telepsych consult ordered in [facility]:  No data recorded ?Time Telepsych consult ordered in [facility]L:  No data recorded ? ?Patient Reported Information Reviewed? Yes ? ?Patient Left Without Being Seen? No data recorded ?Reason for Not Completing Assessment: No data recorded ? ?Collateral Involvement: None provided ? ? ?Does Patient Have a Automotive engineerCourt Appointed Legal Guardian? No data recorded ?  Name and Contact of Legal Guardian: No data recorded ?If Minor and Not Living with Parent(s), Who has Custody? n/a ? ?Is CPS involved or ever been involved? Never ? ?Is  APS involved or ever been involved? Never ? ? ?Patient Determined To Be At Risk for Harm To Self or Others Based on Review of Patient Reported Information or Presenting Complaint? Yes, for Self-Harm ? ?Method: No data recorded ?Availability of Means: No data recorded ?Intent: No data recorded ?Notification Required: No data recorded ?Additional Information for Danger to Others Potential: No data recorded ?Additional Comments for Danger to Others Potential: No data recorded ?Are There Guns or Other Weapons in Your Home? No data recorded ?Types of Guns/Weapons: No data recorded ?Are These Weapons Safely Secured?                            No data recorded ?Who Could Verify You Are Able To Have These Secured: No data recorded ?Do You Have any Outstanding Charges, Pending Court Dates, Parole/Probation? No data recorded ?Contacted To Inform of Risk of Harm To Self or Others: No data recorded ? ?Location of Assessment: [facility] ED ? ? ?Does Patient Present under Involuntary Commitment? Yes ? ?IVC Papers Initial File Date: 12/20/21 ? ? ?[place] of Residence: [place] ? ? ?Patient Currently Receiving the Following Services: Not Receiving Services ? ? ?Determination of Need: Emergent (2 hours) ? ? ?Options For Referral: ED Visit; Therapeutic Triage Services ? ? ? ? ?CCA Biopsychosocial ?Intake/Chief Complaint:  No data recorded ?Current Symptoms/Problems: No data recorded ? ?Patient Reported Schizophrenia/Schizoaffective Diagnosis in Past: No ? ? ?Strengths: Patient is able to ask for help ? ?Preferences: No data recorded ?Abilities: No data recorded ? ?Type of Services Patient Feels are Needed: No data recorded ? ?Initial Clinical Notes/Concerns: No data recorded ? ?Mental Health Symptoms ?Depression:   ?Hopelessness ?  ?Duration of Depressive symptoms:  ?Less than two weeks ?  ?Mania:   ?None ?  ?Anxiety:    ?N/A ?  ?Psychosis:   ?None ?  ?Duration of Psychotic symptoms: No data recorded  ?Trauma:   ?N/A ?  ?Obsessions:    ?Recurrent & persistent thoughts/impulses/images; Good insight; Intrusive/time consuming; Disrupts routine/functioning ?  ?Compulsions:   ?"Driven" to perform behaviors/acts; Good insight; Disrupts with routine/functioning; Intended to reduce stress or prevent another outcome; Intrusive/time consuming; Repeated behaviors/mental acts ?  ?Inattention:   ?None ?  ?Hyperactivity/Impulsivity:   ?None ?  ?Oppositional/Defiant Behaviors:   ?None ?  ?Emotional Irregularity:   ?N/A ?  ?Other Mood/Personality Symptoms:  No data recorded  ? ?Mental Status Exam ?Appearance and self-care  ?Stature:   ?Average ?  ?Weight:   ?Overweight ?  ?Clothing:   ?-- (In scrubs) ?  ?Grooming:   ?Neglected ?  ?Cosmetic use:   ?None ?  ?Posture/gait:   ?Normal ?  ?Motor activity:   ?Slowed ?  ?Sensorium  ?Attention:   ?Inattentive ?  ?Concentration:   ?Variable ?  ?Orientation:   ?Object; Person; Place; Situation ?  ?Recall/memory:   ?Normal ?  ?Affect and Mood  ?Affect:   ?Appropriate ?  ?Mood:   ?Depressed ?  ?Relating  ?Eye contact:   ?None ?  ?Facial expression:   ?Constricted ?  ?Attitude toward examiner:   ?Passive ?  ?Thought and Language  ?Speech flow:  ?Slurred; Other (Comment) (Pt was barely intelligle as he had no voice) ?  ?Thought content:   ?Appropriate to Mood  and Circumstances ?  ?Preoccupation:   ?None ?  ?Hallucinations:   ?None ?  ?Organization:  No data recorded  ?Executive Functions  ?Fund of Knowledge:   ?Fair ?  ?Intelligence:   ?Average ?  ?Abstraction:   ?Overly abstract ?  ?Judgement:   ?Poor ?  ?Reality Testing:   ?Variable ?  ?Insight:   ?Good ?  ?Decision Making:   ?Normal ?  ?Social Functioning  ?Social Maturity:   ?Responsible ?  ?Social Judgement:   ?Impropriety ?  ?Stress  ?Stressors:   ?Other (Comment) (Mental illness/substance abuse) ?  ?Coping Ability:   ?Exhausted ?  ?Skill Deficits:   ?Decision making ?  ?Supports:   ?Support needed ?  ? ? ?Religion: ?Religion/Spirituality ?Are You A Religious Person?:  No ? ?Leisure/Recreation: ?Leisure / Recreation ?Do You Have Hobbies?: No ? ?Exercise/Diet: ?Exercise/Diet ?Do You Exercise?: No ?Have You Gained or Lost A Significant Amount of Weight in the Past Six Months?: No ?

## 2021-12-21 DIAGNOSIS — F1910 Other psychoactive substance abuse, uncomplicated: Secondary | ICD-10-CM | POA: Diagnosis not present

## 2021-12-21 DIAGNOSIS — R45851 Suicidal ideations: Secondary | ICD-10-CM

## 2021-12-21 MED ORDER — BUPRENORPHINE HCL-NALOXONE HCL 8-2 MG SL SUBL
1.0000 | SUBLINGUAL_TABLET | Freq: Every day | SUBLINGUAL | Status: DC
  Administered 2021-12-21: 1 via SUBLINGUAL
  Filled 2021-12-21: qty 1

## 2021-12-21 NOTE — Discharge Instructions (Signed)
Cleared by psychiatry for discharge home. 

## 2021-12-21 NOTE — ED Provider Notes (Addendum)
Emergency Medicine Observation Re-evaluation Note ? ?Randy Woods. is a 41 y.o. male, seen on rounds today.  Pt initially presented to the ED for complaints of Suicidal ? ?Currently, the patient is resting in bed  ? ?Physical Exam  ?Blood pressure 112/77, pulse 89, temperature 98.3 ?F (36.8 ?C), resp. rate 14, height 5\' 9"  (1.753 m), weight 102.1 kg, SpO2 99 %. ? ?Physical Exam ?Physical Exam ?General: No apparent distress ?Pulm: Normal WOB ?Neuro: sleeping  ?   ? ?ED Course / MDM  ?  ? ?I have reviewed the labs performed to date as well as medications administered while in observation.  Multiple + drug on UDS  ? ?Plan  ? ?Current plan is to continue to wait for psych plan/placement if felt warranted  ?Patient is under full IVC at this time. ? ?Patient was cleared by psychiatry for discharge home given 1 dose of Suboxone prior to leaving and told to follow-up with the Suboxone clinics ?  ? , MD ?12/21/21 [id number] ? ?  ?[id number], MD ?12/21/21 1013 ? ?

## 2021-12-21 NOTE — ED Notes (Signed)
Pt discharged home. VS stable. 1 belongings bag returned to patient. Pt denies SI.  ?

## 2021-12-21 NOTE — Consult Note (Signed)
[facility] Face-to-Face Psychiatry Consult  ? ?Reason for Consult:  SI/substance use    ?Referring Physician:  EDP ?Patient Identification: Randy Woods. ?MRN:  [id number] ?Principal Diagnosis: Polysubstance abuse (HCC) ?Diagnosis:  Principal Problem: ?  Polysubstance abuse (HCC) ? ? ?Total Time spent with patient: 1 hour ? ?Subjective:  "I came here and said I was suicidal, because I needed to get Suboxone?"  ?Jjesus L Rishit Burkhalter. is a 41 y.o. male patient admitted with suicidal thoughts. UDS positive for amphetamines, methadone, cannabinoids, opiates. ? ?HPI:  Patient seen. Chart reviewed, PDMP shows that patient received Suboxone 8mg -2 SL film #22 (8 days supply, filled 12/15/2021). Patient admits to selling Suboxone to buy heroin. Writer cautioned patient against this. Patient is irritable.Writer prescribed one Suboxone 8mg -2 to be given now. No prescription will be given. Writer discussed with Dr. 02/14/2022 and EDP. Patient can be discharged to follow up with his Suboxone provider. He is denying SI/HI/AVH.  ? ?Past Psychiatric History: polysubstance abuse ? ?Risk to Self:   ?Risk to Others:   ?Prior Inpatient Therapy:   ?Prior Outpatient Therapy:   ? ?Past Medical History:  ?Past Medical History:  ?Diagnosis Date  ? Anxiety   ? Bipolar 1 disorder (HCC)   ? Bronchitis   ? Cocaine abuse (HCC)   ? Depression   ? Drug abuse (HCC)   ? Heroin abuse (HCC)   ? Schizophrenia (HCC)   ?  ?Past Surgical History:  ?Procedure Laterality Date  ? NOSE SURGERY    ? TEE WITHOUT CARDIOVERSION N/A 05/10/2016  ? Procedure: TRANSESOPHAGEAL ECHOCARDIOGRAM (TEE);  Surgeon: Toni Amend, MD;  Location: [facility] ORS;  Service: Cardiovascular;  Laterality: N/A;  ? TYMPANOPLASTY    ? ?Family History:  ?Family History  ?Problem Relation Age of Onset  ? Diabetes Mother   ? Hypertension Father   ? Kidney failure Father   ? Diabetes Father   ? ?Family Psychiatric  History: none reported ? ?Social History:  ?Social History  ? ?Substance and Sexual  Activity  ?Alcohol Use Yes  ?   ?Social History  ? ?Substance and Sexual Activity  ?Drug Use Not Currently  ? Types: IV, Marijuana, Methamphetamines  ? Comment: heroine  ?  ?Social History  ? ?Socioeconomic History  ? Marital status: Married  ?  Spouse name: Not on file  ? Number of children: Not on file  ? Years of education: Not on file  ? Highest education level: Not on file  ?Occupational History  ? Not on file  ?Tobacco Use  ? Smoking status: Every Day  ?  Packs/day: 1.50  ?  Types: Cigarettes  ? Smokeless tobacco: Current  ?  Types: Snuff  ?Vaping Use  ? Vaping Use: Never used  ?Substance and Sexual Activity  ? Alcohol use: Yes  ? Drug use: Not Currently  ?  Types: IV, Marijuana, Methamphetamines  ?  Comment: heroine  ? Sexual activity: Not on file  ?Other Topics Concern  ? Not on file  ?Social History Narrative  ? Not on file  ? ?Social Determinants of Health  ? ?Financial Resource Strain: Not on file  ?Food Insecurity: Not on file  ?Transportation Needs: Not on file  ?Physical Activity: Not on file  ?Stress: Not on file  ?Social Connections: Not on file  ? ?Additional Social History: ?  ? ?Allergies:   ?Allergies  ?Allergen Reactions  ? Bactrim [Sulfamethoxazole-Trimethoprim]   ? Sulfa Antibiotics Other (See Comments) and Rash  ?  Pt states "the skin peels off of my penis" ?Rash to penis  ? ? ?Labs:  ?Results for orders placed or performed during the hospital encounter of 12/20/21 (from the past 48 hour(s))  ?Comprehensive metabolic panel     Status: Abnormal  ? Collection Time: 12/20/21  1:45 AM  ?Result Value Ref Range  ? Sodium 137 135 - 145 mmol/L  ? Potassium 3.9 3.5 - 5.1 mmol/L  ? Chloride 102 98 - 111 mmol/L  ? CO2 28 22 - 32 mmol/L  ? Glucose, Bld 119 (H) 70 - 99 mg/dL  ?  Comment: Glucose reference range applies only to samples taken after fasting for at least 8 hours.  ? BUN 19 6 - 20 mg/dL  ? Creatinine, Ser 0.80 0.61 - 1.24 mg/dL  ? Calcium 8.6 (L) 8.9 - 10.3 mg/dL  ? Total Protein 7.8 6.5 - 8.1  g/dL  ? Albumin 3.9 3.5 - 5.0 g/dL  ? AST 29 15 - 41 U/L  ? ALT 30 0 - 44 U/L  ? Alkaline Phosphatase 51 38 - 126 U/L  ? Total Bilirubin 0.8 0.3 - 1.2 mg/dL  ? GFR, Estimated >60 >60 mL/min  ?  Comment: (NOTE) ?Calculated using the CKD-EPI Creatinine Equation (2021) ?  ? Anion gap 7 5 - 15  ?  Comment: Performed at [facility], [address] [place], [place] [address]  ?Ethanol     Status: None  ? Collection Time: 12/20/21  1:45 AM  ?Result Value Ref Range  ? Alcohol, Ethyl (B) <10 <10 mg/dL  ?  Comment: (NOTE) ?Lowest detectable limit for serum alcohol is 10 mg/dL. ? ?For medical purposes only. ?Performed at [facility], [address] [place], ?[place] [address] ?  ?Salicylate level     Status: Abnormal  ? Collection Time: 12/20/21  1:45 AM  ?Result Value Ref Range  ? Salicylate Lvl <7.0 (L) 7.0 - 30.0 mg/dL  ?  Comment: Performed at [facility], [address], [place], [place] [address]  ?Acetaminophen level     Status: Abnormal  ? Collection Time: 12/20/21  1:45 AM  ?Result Value Ref Range  ? Acetaminophen (Tylenol), Serum <10 (L) 10 - 30 ug/mL  ?  Comment: (NOTE) ?Therapeutic concentrations vary significantly. A range of 10-30 ug/mL  ?may be an effective concentration for many patients. However, some  ?are best treated at concentrations outside of this range. ?Acetaminophen concentrations >150 ug/mL at 4 hours after ingestion  ?and >50 ug/mL at 12 hours after ingestion are often associated with  ?toxic reactions. ? ?Performed at [facility], [address] [place], ?[place] [address] ?  ?cbc     Status: None  ? Collection Time: 12/20/21  1:45 AM  ?Result Value Ref Range  ? WBC 6.9 4.0 - 10.5 K/uL  ? RBC 4.72 4.22 - 5.81 MIL/uL  ? Hemoglobin 13.2 13.0 - 17.0 g/dL  ? HCT 40.7 39.0 - 52.0 %  ? MCV 86.2 80.0 - 100.0 fL  ? MCH 28.0 26.0 - 34.0 pg  ? MCHC 32.4 30.0 - 36.0 g/dL  ? RDW 12.6 11.5 - 15.5 %  ? Platelets 169 150 - 400 K/uL  ? nRBC 0.0 0.0 - 0.2 %  ?   Comment: Performed at [facility], [address], [place], [place] [address]  ?Urine Drug Screen, Qualitative     Status: Abnormal  ? Collection Time: 12/20/21  1:58 AM  ?Result Value Ref Range  ?  Tricyclic, Ur Screen NONE DETECTED NONE DETECTED  ? Amphetamines, Ur Screen POSITIVE (A) NONE DETECTED  ? MDMA (Ecstasy)Ur Screen NONE DETECTED NONE DETECTED  ? Cocaine Metabolite,Ur [place] NONE DETECTED NONE DETECTED  ? Opiate, Ur Screen POSITIVE (A) NONE DETECTED  ? Phencyclidine (PCP) Ur S NONE DETECTED NONE DETECTED  ? Cannabinoid 50 Ng, Ur  [place] POSITIVE (A) NONE DETECTED  ? Barbiturates, Ur Screen NONE DETECTED NONE DETECTED  ? Benzodiazepine, Ur Scrn NONE DETECTED NONE DETECTED  ? Methadone Scn, Ur POSITIVE (A) NONE DETECTED  ?  Comment: (NOTE) ?Tricyclics + metabolites, urine    Cutoff 1000 ng/mL ?Amphetamines + metabolites, urine  Cutoff 1000 ng/mL ?MDMA (Ecstasy), urine              Cutoff 500 ng/mL ?Cocaine Metabolite, urine          Cutoff 300 ng/mL ?Opiate + metabolites, urine        Cutoff 300 ng/mL ?Phencyclidine (PCP), urine         Cutoff 25 ng/mL ?Cannabinoid, urine                 Cutoff 50 ng/mL ?Barbiturates + metabolites, urine  Cutoff 200 ng/mL ?Benzodiazepine, urine              Cutoff 200 ng/mL ?Methadone, urine                   Cutoff 300 ng/mL ? ?The urine drug screen provides only a preliminary, unconfirmed ?analytical test result and should not be used for non-medical ?purposes. Clinical consideration and professional judgment should ?be applied to any positive drug screen result due to possible ?interfering substances. A more specific alternate chemical method ?must be used in order to obtain a confirmed analytical result. ?Gas chromatography / mass spectrometry (GC/MS) is the preferred ?confirm atory method. ?Performed at [facility], [address] [place], ?[place] [id number] ?  ?Resp Panel by RT-PCR (Flu A&B, Covid) Nasopharyngeal Swab     Status: None  ? Collection Time:  12/20/21  3:01 AM  ? Specimen: Nasopharyngeal Swab; Nasopharyngeal(NP) swabs in vial transport medium  ?Result Value Ref Range  ? SARS Coronavirus 2 by RT PCR NEGATIVE NEGATIVE  ?  Comment: (NOTE) ?SARS-CoV-2 targ

## 2021-12-21 NOTE — ED Notes (Signed)
IVC / pending reassessment in the AM. 

## 2021-12-21 NOTE — ED Notes (Signed)
Clothing searched prior to being given to patient.  Patient dressing for discharge.  ?

## 2021-12-21 NOTE — ED Notes (Signed)
Pt asking why he has not been given suboxone.   NP made aware.  ?

## 2021-12-21 NOTE — ED Notes (Signed)
Breakfast tray given. °

## 2022-01-27 ENCOUNTER — Ambulatory Visit: Payer: Medicaid Other | Admitting: Family Medicine

## 2022-03-17 IMAGING — CT CT HEAD W/O CM
4 series · 16 of 47 positions shown, 18 images · non-contrast
Comparison: CT 10/23/2019

CLINICAL DATA: Altered mental status, methamphetamine use

EXAM:
CT HEAD WITHOUT CONTRAST
TECHNIQUE: Contiguous axial images were obtained from the base of the skull
through the vertex without intravenous contrast.

[Series 2: head wo · axial · 0.45mm/px · z∈[-76,+44]mm · 7 of 32 slices shown, 9 images]
[im 4/32  brain]
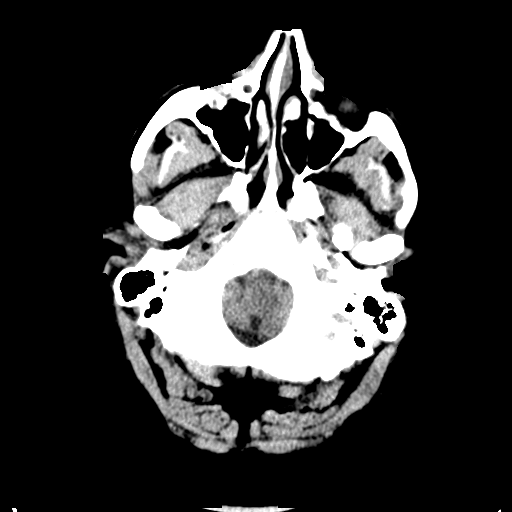
[im 4/32  bone]
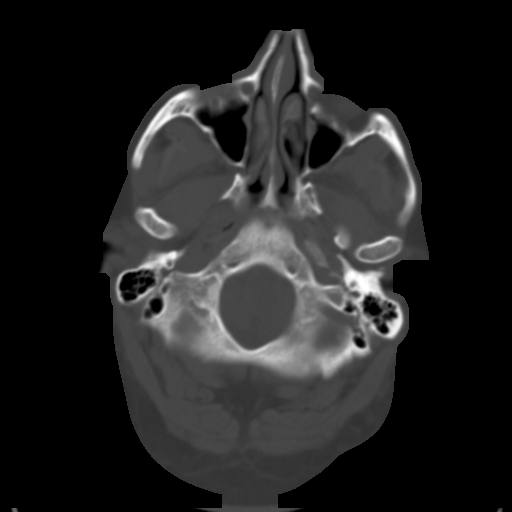
[im 8/32  brain]
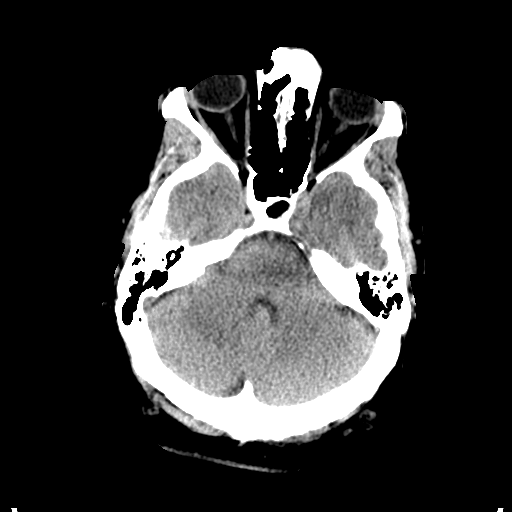
[im 12/32  brain]
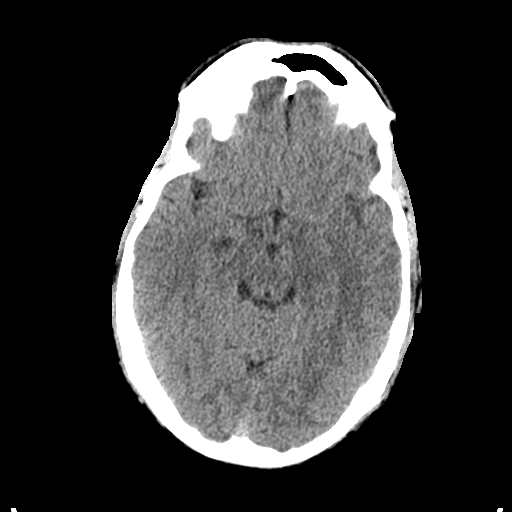
[im 16/32  brain]
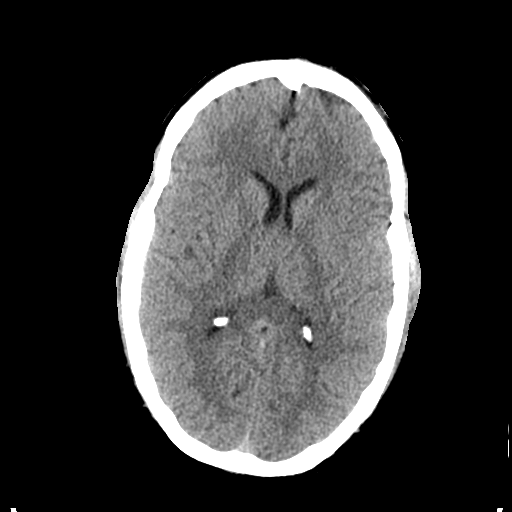
[im 20/32  brain]
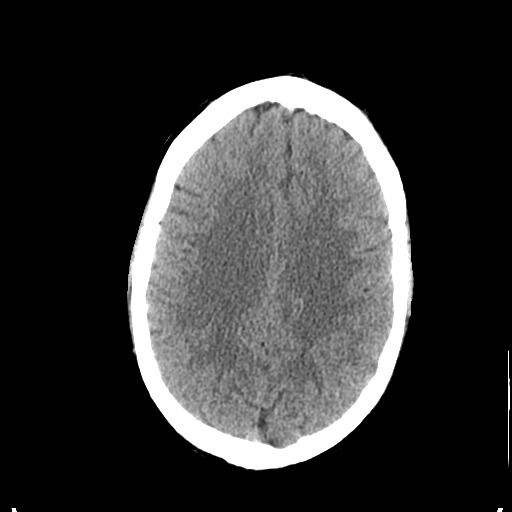
[im 20/32  bone]
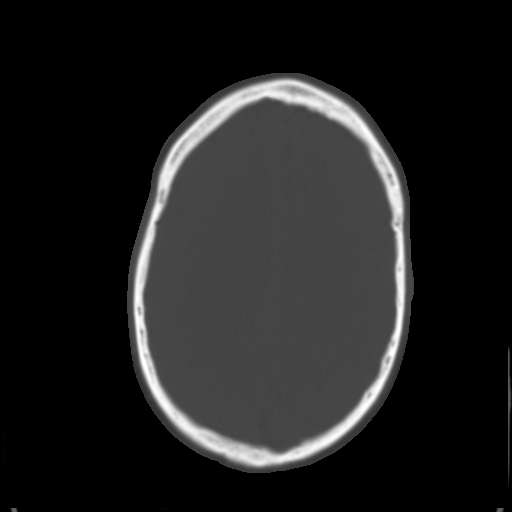
[im 24/32  brain]
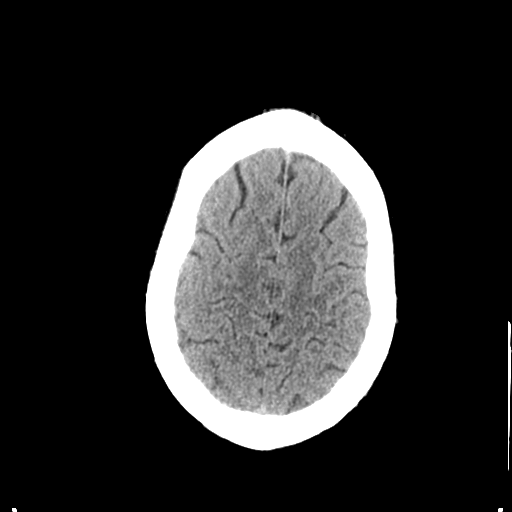
[im 28/32  brain]
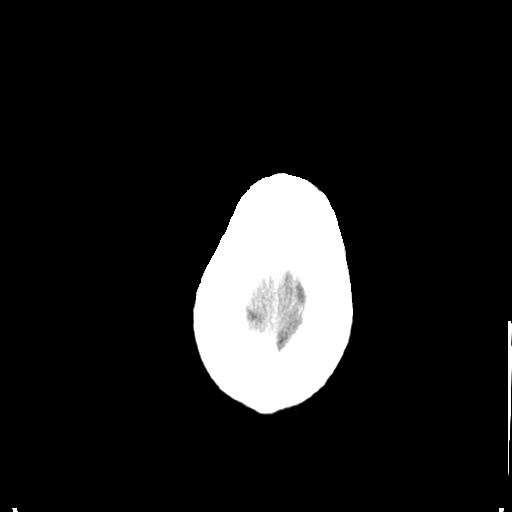

[Series 3: head bone · axial · 0.45mm/px · z∈[-77,-45]mm · 3 of 80 slices shown]
[im 8/80  bone]
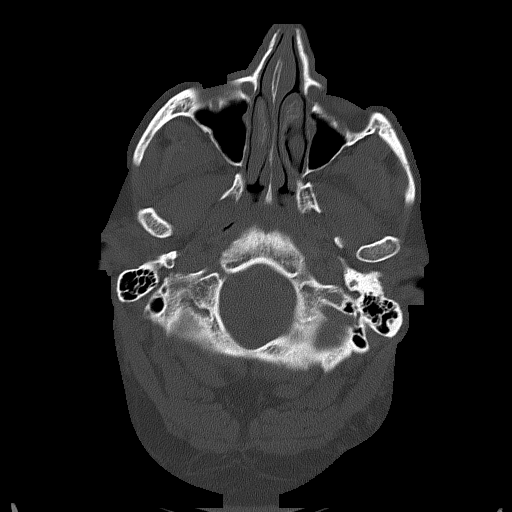
[im 16/80  bone]
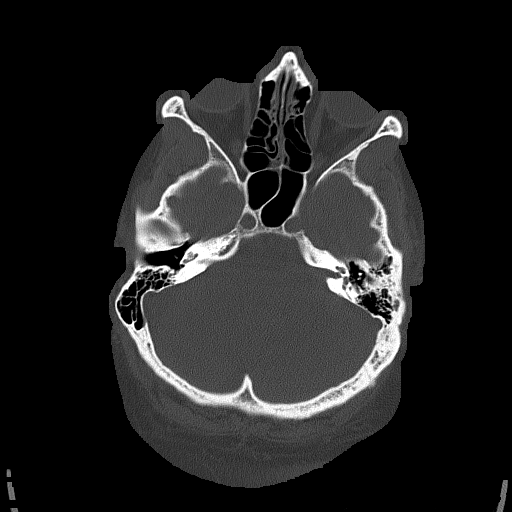
[im 24/80  bone]
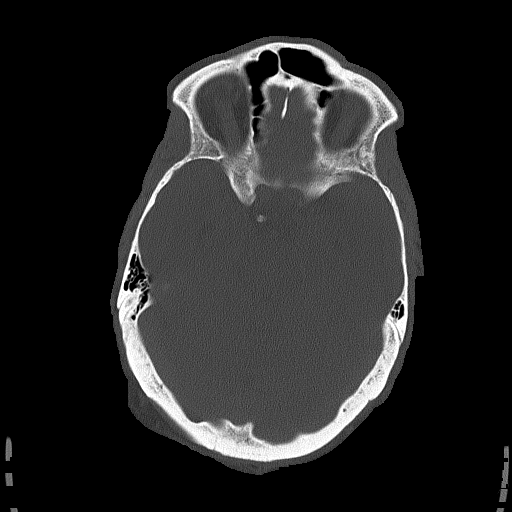

[Series 4: coronal soft tissue · coronal · 0.33mm/px · 3 of 77 slices shown]
[im 26/77  brain]
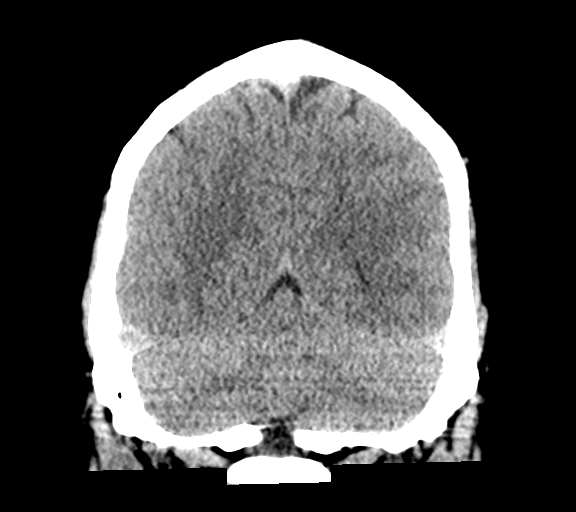
[im 34/77  brain]
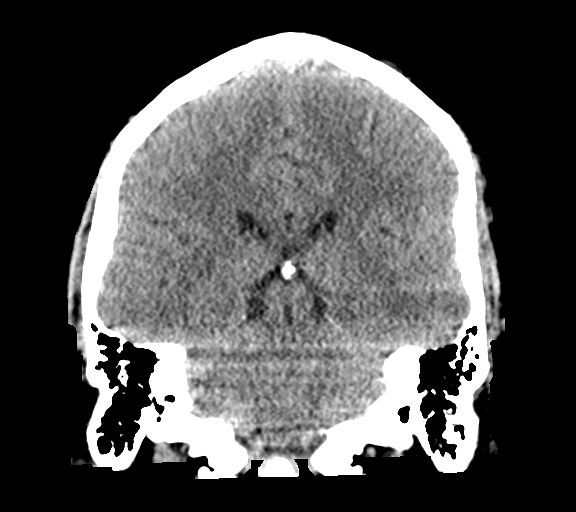
[im 43/77  brain]
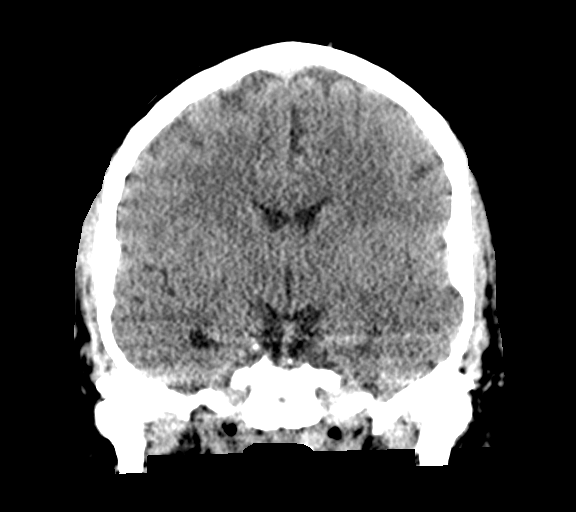

[Series 5: sagittal soft tissue · sagittal · 0.32mm/px · 3 of 59 slices shown]
[im 20/59  brain]
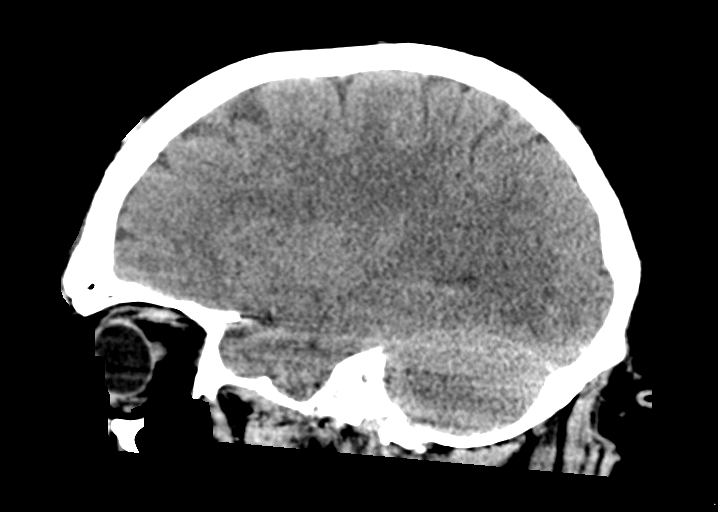
[im 30/59  brain]
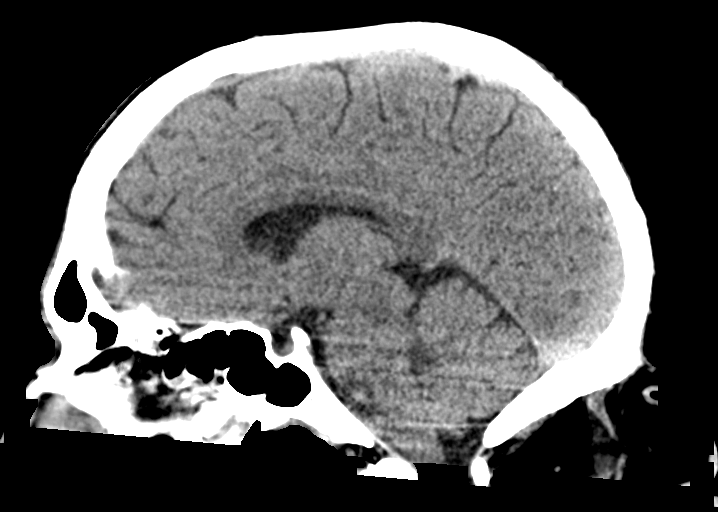
[im 39/59  brain]
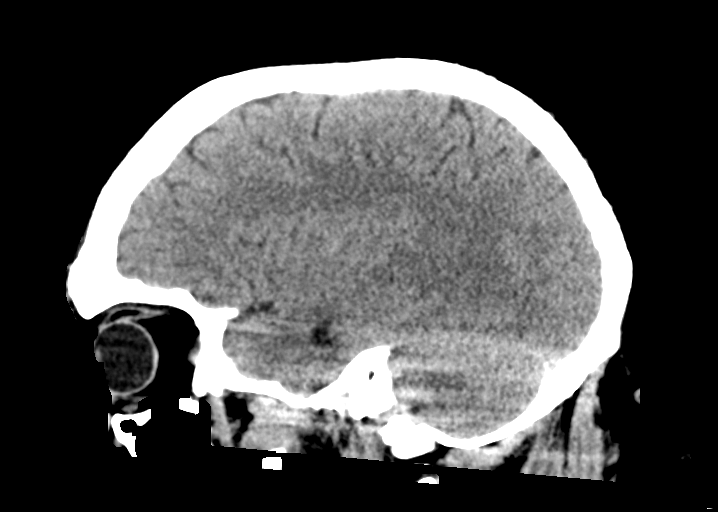

[16 of 47 positions shown; findings below may reference images not displayed]

FINDINGS: Brain: No evidence of acute infarction, hemorrhage, hydrocephalus,
extra-axial collection, visible mass lesion or mass effect.

Vascular: Atherosclerotic calcification of the carotid siphons. No
hyperdense vessel.

Skull: No calvarial fracture or suspicious osseous lesion. No acute
scalp swelling or hematoma. Left frontal scalp infiltration is
likely scarring related to prior hematoma and laceration seen on
comparison imaging.

Sinuses/Orbits: Stable nasal septal deviation with a right-sided
nasal septal spur. Minimal thickening in the ethmoids and nasal
passages. Paranasal sinuses and mastoid air cells are otherwise
predominantly clear. Remote left lamina papyracea fracture. Included
orbital structures are unremarkable.

Other: None.
IMPRESSION: No acute intracranial abnormality.

Intracranial atherosclerosis.

Remote left lamina papyracea fracture.

## 2022-05-14 DEATH — deceased
